# Patient Record
Sex: Male | Born: 1935 | Race: White | Hispanic: No | Marital: Married | Smoking: Former smoker
Health system: Southern US, Community
[De-identification: ages and names within clinical notes are randomized; demographics above are authoritative.]

## PROBLEM LIST (undated history)

## (undated) DIAGNOSIS — K5790 Diverticulosis of intestine, part unspecified, without perforation or abscess without bleeding: Secondary | ICD-10-CM

## (undated) DIAGNOSIS — B029 Zoster without complications: Secondary | ICD-10-CM

## (undated) DIAGNOSIS — C4491 Basal cell carcinoma of skin, unspecified: Secondary | ICD-10-CM

## (undated) DIAGNOSIS — E785 Hyperlipidemia, unspecified: Secondary | ICD-10-CM

## (undated) DIAGNOSIS — N400 Enlarged prostate without lower urinary tract symptoms: Secondary | ICD-10-CM

## (undated) DIAGNOSIS — N189 Chronic kidney disease, unspecified: Secondary | ICD-10-CM

## (undated) DIAGNOSIS — K635 Polyp of colon: Secondary | ICD-10-CM

## (undated) DIAGNOSIS — N200 Calculus of kidney: Secondary | ICD-10-CM

## (undated) DIAGNOSIS — I639 Cerebral infarction, unspecified: Secondary | ICD-10-CM

## (undated) DIAGNOSIS — D649 Anemia, unspecified: Secondary | ICD-10-CM

## (undated) DIAGNOSIS — G459 Transient cerebral ischemic attack, unspecified: Secondary | ICD-10-CM

## (undated) DIAGNOSIS — G5600 Carpal tunnel syndrome, unspecified upper limb: Secondary | ICD-10-CM

## (undated) HISTORY — DX: Transient cerebral ischemic attack, unspecified: G45.9

## (undated) HISTORY — DX: Polyp of colon: K63.5

## (undated) HISTORY — DX: Basal cell carcinoma of skin, unspecified: C44.91

## (undated) HISTORY — DX: Calculus of kidney: N20.0

## (undated) HISTORY — DX: Anemia, unspecified: D64.9

## (undated) HISTORY — DX: Diverticulosis of intestine, part unspecified, without perforation or abscess without bleeding: K57.90

## (undated) HISTORY — DX: Chronic kidney disease, unspecified: N18.9

## (undated) HISTORY — PX: HEMORRHOID SURGERY: SHX153

## (undated) HISTORY — DX: Zoster without complications: B02.9

## (undated) HISTORY — DX: Benign prostatic hyperplasia without lower urinary tract symptoms: N40.0

## (undated) HISTORY — DX: Cerebral infarction, unspecified: I63.9

## (undated) HISTORY — PX: LITHOTRIPSY: SUR834

## (undated) HISTORY — DX: Hyperlipidemia, unspecified: E78.5

## (undated) HISTORY — PX: PROSTATE BIOPSY: SHX241

## (undated) HISTORY — PX: CATARACT EXTRACTION, BILATERAL: SHX1313

## (undated) HISTORY — DX: Carpal tunnel syndrome, unspecified upper limb: G56.00

---

## 1999-01-26 ENCOUNTER — Other Ambulatory Visit: Admission: RE | Admit: 1999-01-26 | Discharge: 1999-01-26 | Payer: Self-pay | Admitting: Urology

## 2002-05-07 ENCOUNTER — Encounter (INDEPENDENT_AMBULATORY_CARE_PROVIDER_SITE_OTHER): Payer: Self-pay | Admitting: Gastroenterology

## 2004-05-23 ENCOUNTER — Ambulatory Visit: Payer: Self-pay | Admitting: Gastroenterology

## 2004-06-06 ENCOUNTER — Ambulatory Visit: Payer: Self-pay | Admitting: Gastroenterology

## 2004-06-19 ENCOUNTER — Ambulatory Visit: Payer: Self-pay | Admitting: Internal Medicine

## 2004-07-31 ENCOUNTER — Ambulatory Visit: Payer: Self-pay | Admitting: Internal Medicine

## 2004-08-28 ENCOUNTER — Ambulatory Visit: Payer: Self-pay | Admitting: Internal Medicine

## 2004-08-31 ENCOUNTER — Ambulatory Visit (HOSPITAL_COMMUNITY): Admission: RE | Admit: 2004-08-31 | Discharge: 2004-08-31 | Payer: Self-pay | Admitting: Urology

## 2004-09-25 ENCOUNTER — Ambulatory Visit: Payer: Self-pay | Admitting: Internal Medicine

## 2004-10-03 ENCOUNTER — Ambulatory Visit: Payer: Self-pay | Admitting: Internal Medicine

## 2004-10-10 ENCOUNTER — Ambulatory Visit (HOSPITAL_BASED_OUTPATIENT_CLINIC_OR_DEPARTMENT_OTHER): Admission: RE | Admit: 2004-10-10 | Discharge: 2004-10-10 | Payer: Self-pay | Admitting: Urology

## 2004-10-11 ENCOUNTER — Ambulatory Visit: Payer: Self-pay | Admitting: Internal Medicine

## 2004-10-11 ENCOUNTER — Encounter: Admission: RE | Admit: 2004-10-11 | Discharge: 2004-10-11 | Payer: Self-pay | Admitting: Internal Medicine

## 2004-11-16 ENCOUNTER — Ambulatory Visit: Payer: Self-pay | Admitting: Internal Medicine

## 2004-12-15 ENCOUNTER — Ambulatory Visit: Payer: Self-pay | Admitting: Internal Medicine

## 2005-01-15 ENCOUNTER — Ambulatory Visit: Payer: Self-pay | Admitting: Internal Medicine

## 2005-02-21 ENCOUNTER — Ambulatory Visit: Payer: Self-pay | Admitting: Internal Medicine

## 2005-03-30 ENCOUNTER — Ambulatory Visit: Payer: Self-pay | Admitting: Internal Medicine

## 2005-05-03 ENCOUNTER — Ambulatory Visit: Payer: Self-pay | Admitting: Internal Medicine

## 2005-05-22 ENCOUNTER — Ambulatory Visit: Payer: Self-pay | Admitting: Internal Medicine

## 2005-06-04 ENCOUNTER — Ambulatory Visit: Payer: Self-pay | Admitting: Internal Medicine

## 2005-07-10 ENCOUNTER — Ambulatory Visit: Payer: Self-pay | Admitting: Internal Medicine

## 2005-07-27 ENCOUNTER — Ambulatory Visit: Payer: Self-pay | Admitting: Family Medicine

## 2005-08-31 ENCOUNTER — Ambulatory Visit: Payer: Self-pay | Admitting: Internal Medicine

## 2005-10-08 ENCOUNTER — Ambulatory Visit: Payer: Self-pay | Admitting: Internal Medicine

## 2005-11-08 ENCOUNTER — Ambulatory Visit: Payer: Self-pay | Admitting: Internal Medicine

## 2005-12-05 ENCOUNTER — Ambulatory Visit: Payer: Self-pay | Admitting: Internal Medicine

## 2006-01-08 ENCOUNTER — Ambulatory Visit: Payer: Self-pay | Admitting: Internal Medicine

## 2006-02-04 ENCOUNTER — Ambulatory Visit: Payer: Self-pay | Admitting: Internal Medicine

## 2006-03-05 ENCOUNTER — Ambulatory Visit: Payer: Self-pay | Admitting: Internal Medicine

## 2006-03-06 ENCOUNTER — Ambulatory Visit: Payer: Self-pay | Admitting: Internal Medicine

## 2006-04-09 ENCOUNTER — Ambulatory Visit: Payer: Self-pay | Admitting: Internal Medicine

## 2006-05-09 ENCOUNTER — Ambulatory Visit: Payer: Self-pay | Admitting: Internal Medicine

## 2006-06-10 ENCOUNTER — Ambulatory Visit: Payer: Self-pay | Admitting: Internal Medicine

## 2006-07-17 ENCOUNTER — Ambulatory Visit: Payer: Self-pay | Admitting: Internal Medicine

## 2006-08-20 ENCOUNTER — Ambulatory Visit: Payer: Self-pay | Admitting: Internal Medicine

## 2006-09-24 ENCOUNTER — Ambulatory Visit: Payer: Self-pay | Admitting: Internal Medicine

## 2006-10-09 ENCOUNTER — Ambulatory Visit: Payer: Self-pay | Admitting: Internal Medicine

## 2006-10-09 LAB — CONVERTED CEMR LAB
Albumin ELP: 57.4 % (ref 55.8–66.1)
Alpha-1-Globulin: 3.9 % (ref 2.9–4.9)
Alpha-2-Globulin: 10.2 % (ref 7.1–11.8)
Beta Globulin: 5.4 % (ref 4.7–7.2)
Gamma Globulin: 19.3 % — ABNORMAL HIGH (ref 11.1–18.8)
M-Spike, %: 0.75
Total Protein, Serum Electrophoresis: 7.3 g/dL (ref 6.0–8.3)

## 2006-11-19 ENCOUNTER — Ambulatory Visit: Payer: Self-pay | Admitting: Internal Medicine

## 2006-12-25 ENCOUNTER — Ambulatory Visit: Payer: Self-pay | Admitting: Internal Medicine

## 2006-12-25 DIAGNOSIS — D649 Anemia, unspecified: Secondary | ICD-10-CM | POA: Insufficient documentation

## 2007-01-23 ENCOUNTER — Ambulatory Visit: Payer: Self-pay | Admitting: Internal Medicine

## 2007-02-26 ENCOUNTER — Ambulatory Visit: Payer: Self-pay | Admitting: Internal Medicine

## 2007-03-28 ENCOUNTER — Ambulatory Visit: Payer: Self-pay | Admitting: Internal Medicine

## 2007-03-31 ENCOUNTER — Ambulatory Visit: Payer: Self-pay | Admitting: Family Medicine

## 2007-03-31 ENCOUNTER — Ambulatory Visit: Payer: Self-pay | Admitting: Cardiology

## 2007-03-31 DIAGNOSIS — N400 Enlarged prostate without lower urinary tract symptoms: Secondary | ICD-10-CM | POA: Insufficient documentation

## 2007-03-31 DIAGNOSIS — R42 Dizziness and giddiness: Secondary | ICD-10-CM | POA: Insufficient documentation

## 2007-03-31 DIAGNOSIS — R519 Headache, unspecified: Secondary | ICD-10-CM | POA: Insufficient documentation

## 2007-03-31 DIAGNOSIS — R51 Headache: Secondary | ICD-10-CM | POA: Insufficient documentation

## 2007-04-02 ENCOUNTER — Ambulatory Visit: Payer: Self-pay | Admitting: Internal Medicine

## 2007-04-07 ENCOUNTER — Encounter: Admission: RE | Admit: 2007-04-07 | Discharge: 2007-04-07 | Payer: Self-pay | Admitting: Internal Medicine

## 2007-04-08 ENCOUNTER — Ambulatory Visit: Payer: Self-pay | Admitting: Internal Medicine

## 2007-04-14 ENCOUNTER — Encounter (INDEPENDENT_AMBULATORY_CARE_PROVIDER_SITE_OTHER): Payer: Self-pay | Admitting: *Deleted

## 2007-04-15 ENCOUNTER — Telehealth (INDEPENDENT_AMBULATORY_CARE_PROVIDER_SITE_OTHER): Payer: Self-pay | Admitting: *Deleted

## 2007-04-16 ENCOUNTER — Encounter (INDEPENDENT_AMBULATORY_CARE_PROVIDER_SITE_OTHER): Payer: Self-pay | Admitting: *Deleted

## 2007-04-18 ENCOUNTER — Telehealth (INDEPENDENT_AMBULATORY_CARE_PROVIDER_SITE_OTHER): Payer: Self-pay | Admitting: *Deleted

## 2007-04-28 ENCOUNTER — Ambulatory Visit: Payer: Self-pay | Admitting: Internal Medicine

## 2007-05-02 ENCOUNTER — Ambulatory Visit: Payer: Self-pay

## 2007-05-02 ENCOUNTER — Encounter (INDEPENDENT_AMBULATORY_CARE_PROVIDER_SITE_OTHER): Payer: Self-pay | Admitting: *Deleted

## 2007-05-19 ENCOUNTER — Telehealth (INDEPENDENT_AMBULATORY_CARE_PROVIDER_SITE_OTHER): Payer: Self-pay | Admitting: *Deleted

## 2007-05-19 ENCOUNTER — Encounter: Payer: Self-pay | Admitting: Internal Medicine

## 2007-05-27 ENCOUNTER — Telehealth: Payer: Self-pay | Admitting: Internal Medicine

## 2007-05-27 ENCOUNTER — Ambulatory Visit: Payer: Self-pay | Admitting: Internal Medicine

## 2007-05-28 ENCOUNTER — Encounter: Payer: Self-pay | Admitting: Internal Medicine

## 2007-06-30 ENCOUNTER — Ambulatory Visit: Payer: Self-pay | Admitting: Internal Medicine

## 2007-06-30 ENCOUNTER — Telehealth (INDEPENDENT_AMBULATORY_CARE_PROVIDER_SITE_OTHER): Payer: Self-pay | Admitting: *Deleted

## 2007-07-29 ENCOUNTER — Ambulatory Visit: Payer: Self-pay | Admitting: Internal Medicine

## 2007-08-18 ENCOUNTER — Encounter: Payer: Self-pay | Admitting: Internal Medicine

## 2007-08-29 ENCOUNTER — Ambulatory Visit: Payer: Self-pay | Admitting: Internal Medicine

## 2007-08-29 ENCOUNTER — Telehealth (INDEPENDENT_AMBULATORY_CARE_PROVIDER_SITE_OTHER): Payer: Self-pay | Admitting: *Deleted

## 2007-09-24 ENCOUNTER — Ambulatory Visit: Payer: Self-pay | Admitting: Internal Medicine

## 2007-09-24 DIAGNOSIS — H02409 Unspecified ptosis of unspecified eyelid: Secondary | ICD-10-CM | POA: Insufficient documentation

## 2007-09-24 DIAGNOSIS — E785 Hyperlipidemia, unspecified: Secondary | ICD-10-CM | POA: Insufficient documentation

## 2007-09-25 ENCOUNTER — Encounter: Admission: RE | Admit: 2007-09-25 | Discharge: 2007-09-25 | Payer: Self-pay | Admitting: Internal Medicine

## 2007-09-29 ENCOUNTER — Encounter (INDEPENDENT_AMBULATORY_CARE_PROVIDER_SITE_OTHER): Payer: Self-pay | Admitting: *Deleted

## 2007-10-06 ENCOUNTER — Encounter: Payer: Self-pay | Admitting: Internal Medicine

## 2007-10-27 ENCOUNTER — Ambulatory Visit: Payer: Self-pay | Admitting: Internal Medicine

## 2007-11-24 ENCOUNTER — Ambulatory Visit: Payer: Self-pay | Admitting: Internal Medicine

## 2007-12-17 ENCOUNTER — Ambulatory Visit: Payer: Self-pay | Admitting: Internal Medicine

## 2007-12-17 LAB — CONVERTED CEMR LAB: Rapid Strep: NEGATIVE

## 2007-12-29 ENCOUNTER — Ambulatory Visit: Payer: Self-pay | Admitting: Internal Medicine

## 2008-01-22 ENCOUNTER — Ambulatory Visit: Payer: Self-pay | Admitting: Internal Medicine

## 2008-03-05 ENCOUNTER — Ambulatory Visit: Payer: Self-pay | Admitting: Internal Medicine

## 2008-04-05 ENCOUNTER — Ambulatory Visit: Payer: Self-pay | Admitting: Internal Medicine

## 2008-05-05 ENCOUNTER — Ambulatory Visit: Payer: Self-pay | Admitting: Internal Medicine

## 2008-05-10 ENCOUNTER — Ambulatory Visit: Payer: Self-pay | Admitting: Internal Medicine

## 2008-05-10 DIAGNOSIS — Z87442 Personal history of urinary calculi: Secondary | ICD-10-CM | POA: Insufficient documentation

## 2008-05-10 DIAGNOSIS — Z86010 Personal history of colonic polyps: Secondary | ICD-10-CM | POA: Insufficient documentation

## 2008-05-10 DIAGNOSIS — K5730 Diverticulosis of large intestine without perforation or abscess without bleeding: Secondary | ICD-10-CM | POA: Insufficient documentation

## 2008-05-10 DIAGNOSIS — Z8673 Personal history of transient ischemic attack (TIA), and cerebral infarction without residual deficits: Secondary | ICD-10-CM | POA: Insufficient documentation

## 2008-05-10 LAB — CONVERTED CEMR LAB
Bilirubin Urine: NEGATIVE
Blood in Urine, dipstick: NEGATIVE
Glucose, Urine, Semiquant: NEGATIVE
Ketones, urine, test strip: NEGATIVE
Nitrite: NEGATIVE
Protein, U semiquant: NEGATIVE
Specific Gravity, Urine: 1.015
Urobilinogen, UA: 0.2
WBC Urine, dipstick: NEGATIVE
pH: 6.5

## 2008-05-14 ENCOUNTER — Ambulatory Visit: Payer: Self-pay | Admitting: Internal Medicine

## 2008-05-15 LAB — CONVERTED CEMR LAB
BUN: 10 mg/dL (ref 6–23)
Basophils Absolute: 0 10*3/uL (ref 0.0–0.1)
Basophils Relative: 0.1 % (ref 0.0–3.0)
Creatinine, Ser: 0.8 mg/dL (ref 0.4–1.5)
Eosinophils Absolute: 0 10*3/uL (ref 0.0–0.7)
Eosinophils Relative: 0.2 % (ref 0.0–5.0)
HCT: 41.2 % (ref 39.0–52.0)
Hemoglobin: 14.3 g/dL (ref 13.0–17.0)
Lymphocytes Relative: 11.5 % — ABNORMAL LOW (ref 12.0–46.0)
MCHC: 34.7 g/dL (ref 30.0–36.0)
MCV: 97 fL (ref 78.0–100.0)
Monocytes Absolute: 0.4 10*3/uL (ref 0.1–1.0)
Monocytes Relative: 5.9 % (ref 3.0–12.0)
Neutro Abs: 5.5 10*3/uL (ref 1.4–7.7)
Neutrophils Relative %: 82.3 % — ABNORMAL HIGH (ref 43.0–77.0)
Platelets: 214 10*3/uL (ref 150–400)
RBC: 4.25 M/uL (ref 4.22–5.81)
RDW: 11.9 % (ref 11.5–14.6)
WBC: 6.7 10*3/uL (ref 4.5–10.5)

## 2008-05-18 ENCOUNTER — Encounter (INDEPENDENT_AMBULATORY_CARE_PROVIDER_SITE_OTHER): Payer: Self-pay | Admitting: *Deleted

## 2008-05-18 ENCOUNTER — Telehealth: Payer: Self-pay | Admitting: Internal Medicine

## 2008-05-19 ENCOUNTER — Telehealth (INDEPENDENT_AMBULATORY_CARE_PROVIDER_SITE_OTHER): Payer: Self-pay | Admitting: *Deleted

## 2008-05-20 ENCOUNTER — Encounter: Payer: Self-pay | Admitting: Internal Medicine

## 2008-05-24 ENCOUNTER — Telehealth (INDEPENDENT_AMBULATORY_CARE_PROVIDER_SITE_OTHER): Payer: Self-pay | Admitting: *Deleted

## 2008-05-26 ENCOUNTER — Ambulatory Visit: Payer: Self-pay | Admitting: Internal Medicine

## 2008-05-27 ENCOUNTER — Ambulatory Visit (HOSPITAL_COMMUNITY): Admission: RE | Admit: 2008-05-27 | Discharge: 2008-05-27 | Payer: Self-pay | Admitting: Urology

## 2008-06-01 ENCOUNTER — Encounter (INDEPENDENT_AMBULATORY_CARE_PROVIDER_SITE_OTHER): Payer: Self-pay | Admitting: *Deleted

## 2008-06-01 ENCOUNTER — Ambulatory Visit: Payer: Self-pay | Admitting: Internal Medicine

## 2008-06-01 LAB — CONVERTED CEMR LAB
OCCULT 1: NEGATIVE
OCCULT 2: NEGATIVE
OCCULT 3: NEGATIVE

## 2008-06-07 ENCOUNTER — Ambulatory Visit: Payer: Self-pay | Admitting: Internal Medicine

## 2008-07-05 ENCOUNTER — Ambulatory Visit: Payer: Self-pay | Admitting: Internal Medicine

## 2008-08-06 ENCOUNTER — Ambulatory Visit: Payer: Self-pay | Admitting: Internal Medicine

## 2008-09-06 ENCOUNTER — Ambulatory Visit: Payer: Self-pay | Admitting: Internal Medicine

## 2008-10-11 ENCOUNTER — Encounter: Payer: Self-pay | Admitting: Internal Medicine

## 2008-10-12 ENCOUNTER — Ambulatory Visit: Payer: Self-pay | Admitting: Internal Medicine

## 2008-11-10 ENCOUNTER — Ambulatory Visit: Payer: Self-pay | Admitting: Internal Medicine

## 2008-11-29 ENCOUNTER — Ambulatory Visit: Payer: Self-pay | Admitting: Family Medicine

## 2008-11-30 ENCOUNTER — Encounter: Payer: Self-pay | Admitting: Family Medicine

## 2008-12-06 ENCOUNTER — Ambulatory Visit: Payer: Self-pay | Admitting: Internal Medicine

## 2009-01-07 ENCOUNTER — Ambulatory Visit: Payer: Self-pay | Admitting: Internal Medicine

## 2009-01-08 LAB — CONVERTED CEMR LAB
Basophils Absolute: 0 10*3/uL (ref 0.0–0.1)
Basophils Relative: 1 % (ref 0–1)
Eosinophils Absolute: 0.1 10*3/uL (ref 0.0–0.7)
Eosinophils Relative: 3 % (ref 0–5)
HCT: 36.9 % — ABNORMAL LOW (ref 39.0–52.0)
Hemoglobin: 12.4 g/dL — ABNORMAL LOW (ref 13.0–17.0)
Lymphocytes Relative: 27 % (ref 12–46)
Lymphs Abs: 1.2 10*3/uL (ref 0.7–4.0)
MCHC: 33.6 g/dL (ref 30.0–36.0)
MCV: 95.3 fL (ref 78.0–100.0)
Monocytes Absolute: 0.7 10*3/uL (ref 0.1–1.0)
Monocytes Relative: 14 % — ABNORMAL HIGH (ref 3–12)
Neutro Abs: 2.5 10*3/uL (ref 1.7–7.7)
Neutrophils Relative %: 55 % (ref 43–77)
Platelets: 184 10*3/uL (ref 150–400)
RBC: 3.87 M/uL — ABNORMAL LOW (ref 4.22–5.81)
RDW: 13.5 % (ref 11.5–15.5)
WBC: 4.5 10*3/uL (ref 4.0–10.5)

## 2009-01-10 ENCOUNTER — Encounter (INDEPENDENT_AMBULATORY_CARE_PROVIDER_SITE_OTHER): Payer: Self-pay | Admitting: *Deleted

## 2009-01-20 HISTORY — PX: COLONOSCOPY W/ POLYPECTOMY: SHX1380

## 2009-02-02 ENCOUNTER — Ambulatory Visit: Payer: Self-pay | Admitting: Gastroenterology

## 2009-02-08 ENCOUNTER — Ambulatory Visit: Payer: Self-pay | Admitting: Internal Medicine

## 2009-02-15 ENCOUNTER — Telehealth: Payer: Self-pay | Admitting: Gastroenterology

## 2009-02-17 ENCOUNTER — Encounter: Payer: Self-pay | Admitting: Gastroenterology

## 2009-02-17 ENCOUNTER — Ambulatory Visit: Payer: Self-pay | Admitting: Gastroenterology

## 2009-02-17 LAB — HM COLONOSCOPY

## 2009-03-01 ENCOUNTER — Encounter: Payer: Self-pay | Admitting: Gastroenterology

## 2009-03-11 ENCOUNTER — Ambulatory Visit: Payer: Self-pay | Admitting: Internal Medicine

## 2009-04-15 ENCOUNTER — Ambulatory Visit: Payer: Self-pay | Admitting: Internal Medicine

## 2009-05-06 ENCOUNTER — Encounter (INDEPENDENT_AMBULATORY_CARE_PROVIDER_SITE_OTHER): Payer: Self-pay | Admitting: *Deleted

## 2009-05-12 ENCOUNTER — Telehealth: Payer: Self-pay | Admitting: Gastroenterology

## 2009-05-16 ENCOUNTER — Ambulatory Visit: Payer: Self-pay | Admitting: Internal Medicine

## 2009-06-13 ENCOUNTER — Ambulatory Visit: Payer: Self-pay | Admitting: Internal Medicine

## 2009-06-23 ENCOUNTER — Ambulatory Visit: Payer: Self-pay | Admitting: Internal Medicine

## 2009-07-12 ENCOUNTER — Ambulatory Visit: Payer: Self-pay | Admitting: Internal Medicine

## 2009-08-12 ENCOUNTER — Ambulatory Visit: Payer: Self-pay | Admitting: Internal Medicine

## 2009-09-19 ENCOUNTER — Ambulatory Visit: Payer: Self-pay | Admitting: Internal Medicine

## 2009-09-21 ENCOUNTER — Encounter: Payer: Self-pay | Admitting: Internal Medicine

## 2009-10-17 ENCOUNTER — Ambulatory Visit: Payer: Self-pay | Admitting: Internal Medicine

## 2009-11-17 ENCOUNTER — Ambulatory Visit: Payer: Self-pay | Admitting: Internal Medicine

## 2009-12-20 ENCOUNTER — Ambulatory Visit: Payer: Self-pay | Admitting: Internal Medicine

## 2010-01-20 ENCOUNTER — Ambulatory Visit: Payer: Self-pay | Admitting: Family Medicine

## 2010-01-25 ENCOUNTER — Encounter: Payer: Self-pay | Admitting: Internal Medicine

## 2010-01-26 ENCOUNTER — Ambulatory Visit: Payer: Self-pay | Admitting: Internal Medicine

## 2010-02-10 ENCOUNTER — Telehealth (INDEPENDENT_AMBULATORY_CARE_PROVIDER_SITE_OTHER): Payer: Self-pay | Admitting: *Deleted

## 2010-02-20 ENCOUNTER — Ambulatory Visit: Payer: Self-pay | Admitting: Internal Medicine

## 2010-03-28 ENCOUNTER — Ambulatory Visit: Payer: Self-pay | Admitting: Internal Medicine

## 2010-03-31 ENCOUNTER — Ambulatory Visit: Payer: Self-pay | Admitting: Internal Medicine

## 2010-03-31 DIAGNOSIS — Z85828 Personal history of other malignant neoplasm of skin: Secondary | ICD-10-CM | POA: Insufficient documentation

## 2010-04-24 ENCOUNTER — Ambulatory Visit: Payer: Self-pay | Admitting: Internal Medicine

## 2010-05-23 ENCOUNTER — Ambulatory Visit: Payer: Self-pay | Admitting: Internal Medicine

## 2010-05-25 ENCOUNTER — Telehealth: Payer: Self-pay | Admitting: Internal Medicine

## 2010-07-20 ENCOUNTER — Ambulatory Visit
Admission: RE | Admit: 2010-07-20 | Discharge: 2010-07-20 | Payer: Self-pay | Source: Home / Self Care | Attending: Internal Medicine | Admitting: Internal Medicine

## 2010-07-28 ENCOUNTER — Telehealth: Payer: Self-pay | Admitting: Internal Medicine

## 2010-07-28 ENCOUNTER — Ambulatory Visit
Admission: RE | Admit: 2010-07-28 | Discharge: 2010-07-28 | Payer: Self-pay | Source: Home / Self Care | Attending: Internal Medicine | Admitting: Internal Medicine

## 2010-07-28 ENCOUNTER — Other Ambulatory Visit: Payer: Self-pay | Admitting: Internal Medicine

## 2010-07-28 ENCOUNTER — Encounter: Payer: Self-pay | Admitting: Internal Medicine

## 2010-07-28 LAB — CBC WITH DIFFERENTIAL/PLATELET
Basophils Absolute: 0 10*3/uL (ref 0.0–0.1)
Basophils Relative: 0.9 % (ref 0.0–3.0)
Eosinophils Absolute: 0.1 10*3/uL (ref 0.0–0.7)
Eosinophils Relative: 2.7 % (ref 0.0–5.0)
HCT: 39.2 % (ref 39.0–52.0)
Hemoglobin: 13.9 g/dL (ref 13.0–17.0)
Lymphocytes Relative: 23.8 % (ref 12.0–46.0)
Lymphs Abs: 1.3 10*3/uL (ref 0.7–4.0)
MCHC: 35.4 g/dL (ref 30.0–36.0)
MCV: 95.4 fl (ref 78.0–100.0)
Monocytes Absolute: 0.7 10*3/uL (ref 0.1–1.0)
Monocytes Relative: 13.3 % — ABNORMAL HIGH (ref 3.0–12.0)
Neutro Abs: 3.2 10*3/uL (ref 1.4–7.7)
Neutrophils Relative %: 59.3 % (ref 43.0–77.0)
Platelets: 195 10*3/uL (ref 150.0–400.0)
RBC: 4.11 Mil/uL — ABNORMAL LOW (ref 4.22–5.81)
RDW: 13 % (ref 11.5–14.6)
WBC: 5.4 10*3/uL (ref 4.5–10.5)

## 2010-07-28 LAB — BASIC METABOLIC PANEL
BUN: 22 mg/dL (ref 6–23)
CO2: 31 mEq/L (ref 19–32)
Calcium: 9.2 mg/dL (ref 8.4–10.5)
Chloride: 102 mEq/L (ref 96–112)
Creatinine, Ser: 0.7 mg/dL (ref 0.4–1.5)
GFR: 110.78 mL/min (ref >60.00)
Glucose, Bld: 84 mg/dL (ref 70–99)
Potassium: 3.8 mEq/L (ref 3.5–5.1)
Sodium: 142 mEq/L (ref 135–145)

## 2010-08-20 LAB — CONVERTED CEMR LAB
ALT: 18 units/L (ref 0–53)
ALT: 19 units/L (ref 0–53)
AST: 21 units/L (ref 0–37)
AST: 24 units/L (ref 0–37)
Albumin: 3.6 g/dL (ref 3.5–5.2)
Albumin: 3.8 g/dL (ref 3.5–5.2)
Alkaline Phosphatase: 70 units/L (ref 39–117)
Alkaline Phosphatase: 71 units/L (ref 39–117)
BUN: 16 mg/dL (ref 6–23)
BUN: 19 mg/dL (ref 6–23)
Basophils Absolute: 0 10*3/uL (ref 0.0–0.1)
Basophils Absolute: 0.1 10*3/uL (ref 0.0–0.1)
Basophils Relative: 0.8 % (ref 0.0–3.0)
Basophils Relative: 1.3 % — ABNORMAL HIGH (ref 0.0–1.0)
Bilirubin Urine: NEGATIVE
Bilirubin, Direct: 0.1 mg/dL (ref 0.0–0.3)
Bilirubin, Direct: 0.1 mg/dL (ref 0.0–0.3)
Blood in Urine, dipstick: NEGATIVE
CO2: 31 meq/L (ref 19–32)
CO2: 33 meq/L — ABNORMAL HIGH (ref 19–32)
Calcium: 8.6 mg/dL (ref 8.4–10.5)
Calcium: 8.6 mg/dL (ref 8.4–10.5)
Chloride: 105 meq/L (ref 96–112)
Chloride: 107 meq/L (ref 96–112)
Cholesterol, target level: 200 mg/dL
Cholesterol: 130 mg/dL (ref 0–200)
Creatinine, Ser: 0.7 mg/dL (ref 0.4–1.5)
Creatinine, Ser: 0.8 mg/dL (ref 0.4–1.5)
Eosinophils Absolute: 0.1 10*3/uL (ref 0.0–0.6)
Eosinophils Absolute: 0.1 10*3/uL (ref 0.0–0.7)
Eosinophils Relative: 1.9 % (ref 0.0–5.0)
Eosinophils Relative: 2.9 % (ref 0.0–5.0)
Folate: 12.6 ng/mL
GFR calc Af Amer: 120 mL/min
GFR calc non Af Amer: 112.67 mL/min (ref >60)
GFR calc non Af Amer: 99 mL/min
Glucose, Bld: 77 mg/dL (ref 70–99)
Glucose, Bld: 91 mg/dL (ref 70–99)
Glucose, Urine, Semiquant: NEGATIVE
HCT: 36.6 % — ABNORMAL LOW (ref 39.0–52.0)
HCT: 38.1 % — ABNORMAL LOW (ref 39.0–52.0)
HDL goal, serum: 40 mg/dL
HDL: 42.2 mg/dL (ref >39.00)
Hemoglobin: 12.7 g/dL — ABNORMAL LOW (ref 13.0–17.0)
Hemoglobin: 13.3 g/dL (ref 13.0–17.0)
Ketones, urine, test strip: NEGATIVE
LDL Cholesterol: 74 mg/dL (ref 0–99)
LDL Goal: 100 mg/dL
Lymphocytes Relative: 23 % (ref 12.0–46.0)
Lymphocytes Relative: 23.7 % (ref 12.0–46.0)
Lymphs Abs: 1.1 10*3/uL (ref 0.7–4.0)
MCHC: 34.8 g/dL (ref 30.0–36.0)
MCHC: 34.8 g/dL (ref 30.0–36.0)
MCV: 95.4 fL (ref 78.0–100.0)
MCV: 97 fL (ref 78.0–100.0)
Monocytes Absolute: 0.5 10*3/uL (ref 0.1–1.0)
Monocytes Absolute: 0.6 10*3/uL (ref 0.2–0.7)
Monocytes Relative: 10.8 % (ref 3.0–12.0)
Monocytes Relative: 12.8 % — ABNORMAL HIGH (ref 3.0–11.0)
Neutro Abs: 2.9 10*3/uL (ref 1.4–7.7)
Neutro Abs: 3 10*3/uL (ref 1.4–7.7)
Neutrophils Relative %: 59.3 % (ref 43.0–77.0)
Neutrophils Relative %: 63.5 % (ref 43.0–77.0)
Nitrite: NEGATIVE
Platelets: 176 10*3/uL (ref 150.0–400.0)
Platelets: 199 10*3/uL (ref 150–400)
Potassium: 3.7 meq/L (ref 3.5–5.1)
Potassium: 3.9 meq/L (ref 3.5–5.1)
Protein, U semiquant: NEGATIVE
RBC: 3.77 M/uL — ABNORMAL LOW (ref 4.22–5.81)
RBC: 3.99 M/uL — ABNORMAL LOW (ref 4.22–5.81)
RDW: 12.2 % (ref 11.5–14.6)
RDW: 13.4 % (ref 11.5–14.6)
Sed Rate: 19 mm/hr (ref 0–20)
Sodium: 141 meq/L (ref 135–145)
Sodium: 143 meq/L (ref 135–145)
Specific Gravity, Urine: 1.01
TSH: 2.87 microintl units/mL (ref 0.35–5.50)
TSH: 3.55 microintl units/mL (ref 0.35–5.50)
Total Bilirubin: 0.6 mg/dL (ref 0.3–1.2)
Total Bilirubin: 0.8 mg/dL (ref 0.3–1.2)
Total CHOL/HDL Ratio: 3
Total Protein: 6.5 g/dL (ref 6.0–8.3)
Total Protein: 6.9 g/dL (ref 6.0–8.3)
Triglycerides: 68 mg/dL (ref 0.0–149.0)
Urobilinogen, UA: NEGATIVE
VLDL: 13.6 mg/dL (ref 0.0–40.0)
Vitamin B-12: 822 pg/mL (ref 211–911)
WBC Urine, dipstick: NEGATIVE
WBC: 4.7 10*3/uL (ref 4.5–10.5)
WBC: 4.8 10*3/uL (ref 4.5–10.5)
pH: 7

## 2010-08-22 NOTE — Assessment & Plan Note (Signed)
Summary: B 12 SHOT//CA   Nurse Visit       Medication Administration  Injection # 1:    Medication: Vit B12 1000 mcg    Diagnosis: ANEMIA (ICD-285.9)    Route: IM    Site: L deltoid    Exp Date: 07/23/2008    Lot #: [id number]    Mfr: american regent    Given by: Floydene Flock (January 23, 2007 1:33 PM)  Orders Added: 1)  Vit B12 1000 mcg [J3420] 2)  Admin of Therapeutic Inj  intramuscular or subcutaneous [90772]

## 2010-08-22 NOTE — Progress Notes (Signed)
Summary: NEW MEDICINE IS REALLY WORKING --HEADACHE IS GONE  Phone Note Call from Patient Call back at Home Phone [phone]   Reason for Call: Talk to Nurse Summary of Call: DR HOPPER PATIENT  STOPPED BY TO SPEAK TO KATHY, TOLD HIM KATHY AND DR HOPPER WERE REALLY BEHIND, WOULD HE LEAVE A MESSAGE??-----HE SAID HE JUST WANTED KATHY AND DR HOPPER TO KNOW THAT THE "NEW MEDICINE" WAS OK--HEADACHE WAS GONE--HE DOES WANT KATHY TO CALL HIM BACK ON CELL AT [phone] Initial call taken by: Jerolyn Shin,  April 18, 2007 2:41 PM  Follow-up for Phone Call        called mr. Muenchow and he is feeling good and wants to go back to work/  I told him we can not allow that until we find out what happened and the neurologist is the person to do that.  Suggested he call the neurologist office and check and see if any cancellations.   Follow-up by: Wendall Stade,  April 18, 2007 7:00 PM

## 2010-08-22 NOTE — Assessment & Plan Note (Signed)
Summary: B12 SHOT/KDC   Nurse Visit   Allergies: 1)  ! Prednisone  Medication Administration  Injection # 1:    Medication: Vit B12 1000 mcg    Diagnosis: ANEMIA (ICD-285.9)    Route: IM    Site: L deltoid    Exp Date: 05/24/2011    Lot #: [id number]    Mfr: American Regent    Given by: Doristine Devoid (September 19, 2009 11:55 AM)  Orders Added: 1)  Vit B12 1000 mcg [J3420] 2)  Admin of Therapeutic Inj  intramuscular or subcutaneous [96372]   Medication Administration  Injection # 1:    Medication: Vit B12 1000 mcg    Diagnosis: ANEMIA (ICD-285.9)    Route: IM    Site: L deltoid    Exp Date: 05/24/2011    Lot #: [id number]    Mfr: American Regent    Given by: Doristine Devoid (September 19, 2009 11:55 AM)  Orders Added: 1)  Vit B12 1000 mcg [J3420] 2)  Admin of Therapeutic Inj  intramuscular or subcutaneous [29562]

## 2010-08-22 NOTE — Assessment & Plan Note (Signed)
Summary: acute only for kidney stone//ph   Vital Signs:  Patient Profile:   77 Years Old Male Weight:      189.6 pounds Temp:     97.5 degrees F oral Pulse rate:   78 / minute Resp:     17 per minute BP sitting:   138 / 80  (left arm) Cuff size:   large  Pt. in pain?   yes    Location:   L flank     Intensity:   7 or <    Type:       burning  Vitals Entered By: Shonna Chock (May 10, 2008 9:44 AM)                  PCP:  [facility]  Chief Complaint:  DISCUSS KIDNEY STONE-NO SLEEP DUE TO DISCOMFORT. DISCUSS FLU VACCINE and Back pain.  History of Present Illness: Acute onset 05/08/08 after reclining . Rx: Tylenol , ice & heat  w/o benefit. PMH of kidney stones(Dr Evans)  Back Pain      This is an 77 year old man who presents with Back pain.  The patient reports rest pain, but denies fever, chills, weakness, loss of sensation, fecal incontinence, urinary incontinence, urinary retention, dysuria, inability to work, and inability to care for self.  The pain is located in the left low back.  The pain began at home and suddenly.  The pain radiates to the left flank.  The pain is made worse by lying down.  Risk factors for serious underlying conditions include bedrest with no relief and age >= 50 years.      Current Allergies: ! PREDNISONE  Past Medical History:    Benign prostatic hypertrophy    Hyperlipidemia    Nephrolithiasis, hx of    Transient ischemic attack, hx of; Dr Pearlean Brownie    Colonic polyps, hx of    Diverticulosis, colon  Past Surgical History:    Lithotripsy, DrEvans    Hemorrhoidectomy    Colon polypectomy, due 2010 (prev Dr Victorino Dike; he'll see Dr Jarold Motto)     Review of Systems  General      Denies fatigue and sweats.  GI      Complains of nausea.      Denies abdominal pain, bloody stools, dark tarry stools, indigestion, and vomiting.  GU      See HPI      Denies dysuria.  MS      See HPI      Denies muscle aches and muscle  weakness.  Derm      Denies changes in color of skin, lesion(s), and rash.  Neuro      Complains of numbness and tingling.      Positional hand N&T @ night  Heme      Complains of abnormal bruising.      Denies bleeding.      On Aggrenox   Physical Exam  General:     well-nourished,in no acute distress; alert,appropriate and cooperative throughout examination; appears younger than age Eyes:     No corneal or conjunctival inflammation noted. Perrla. No icterus. Ptosis OD >OS Lungs:     Normal respiratory effort, chest expands symmetrically. Lungs are clear to auscultation, no crackles or wheezes. Heart:     Normal rate and regular rhythm. S1 and S2 normal without gallop, murmur, click, rub or other extra sounds. Abdomen:     Bowel sounds positive,abdomen soft and non-tender without masses, organomegaly or hernias noted.  Msk:     Asymmetry of thoracic muscles ; R>L. Tender to percussion L flank Extremities:     No clubbing, cyanosis, edema, or deformity noted with normal full range of motion of all joints.   Neg SLR bilat Neurologic:     alert & oriented X3, strength normal in all extremities, heel/toe walking gait normal, and DTRs symmetrical and normal.   Skin:     Intact without suspicious lesions or rashes Cervical Nodes:     No lymphadenopathy noted Axillary Nodes:     No palpable lymphadenopathy Psych:     memory intact for recent and remote, normally interactive, good eye contact, not anxious appearing, and not depressed appearing.      Impression & Recommendations:  Problem # 1:  FLANK PAIN, LEFT (ICD-789.09)  Orders: UA Dipstick w/o Micro (manual) (62952) TLB-CBC Platelet - w/Differential (85025-CBCD) TLB-Creatinine, Blood (82565-CREA) TLB-BUN (Urea Nitrogen) (84520-BUN) Radiology Referral (Radiology) Durable Medical Equipment (DME) Urology Referral (Urology)  His updated medication list for this problem includes:    Hydrocodone-acetaminophen 5-500 Mg  Tabs (Hydrocodone-acetaminophen) .Marland Kitchen... 1-2 q6 -8 hrs   Problem # 2:  NEPHROLITHIASIS, HX OF (ICD-V13.01)  Orders: UA Dipstick w/o Micro (manual) (84132) TLB-CBC Platelet - w/Differential (85025-CBCD) Radiology Referral (Radiology) Durable Medical Equipment (DME) Urology Referral (Urology)   Problem # 3:  COLONIC POLYPS, HX OF (ICD-V12.72)  Orders: UA Dipstick w/o Micro (manual) (44010) TLB-CBC Platelet - w/Differential (85025-CBCD)   Problem # 4:  DIVERTICULOSIS, COLON (ICD-562.10)  Orders: UA Dipstick w/o Micro (manual) (27253) TLB-CBC Platelet - w/Differential (85025-CBCD)   Complete Medication List: 1)  Meclizine Hcl 25 Mg Tabs (Meclizine hcl) .... 1/2-1 tab po every 8 hours as needed for dizziness 2)  Proscar 5 Mg Tabs (Finasteride) .Marland Kitchen.. 1 by mouth once daily 3)  Clonazepam 0.5 Mg Tabs (Clonazepam) .... Take as directed 4)  Aggrenox 25-200 Mg Cp12 (Aspirin-dipyridamole) .Marland Kitchen.. 1 bid 5)  Lipitor 10 Mg Tabs (Atorvastatin calcium) .Marland Kitchen.. 1 by mouth qd 6)  Doxycycline Monohydrate 100 Mg Caps (Doxycycline monohydrate) .Marland Kitchen.. 1 two times a day x 5 days then 1 once daily 7)  Hycodan 5-1.5 Mg/22ml Syrp (Hydrocodone-homatropine) .Marland Kitchen.. 1 tsp q 6 hrs prn 8)  Hydrocodone-acetaminophen 5-500 Mg Tabs (Hydrocodone-acetaminophen) .Marland Kitchen.. 1-2 q6 -8 hrs   Patient Instructions: 1)  Drink as much fluid as you can tolerate for the next few days.Complete stool cards ; strain urine. 2)  Recommended remaining out of work for  10/19-21/09; you can't work on pain meds.   Prescriptions: HYDROCODONE-ACETAMINOPHEN 5-500 MG TABS (HYDROCODONE-ACETAMINOPHEN) 1-2 q6 -8 hrs  #30 x 0   Entered and Authorized by:   Marga Melnick MD   Signed by:   Marga Melnick MD on 05/10/2008   Method used:   Print then Give to Patient   RxID:   [id number]  ] Laboratory Results   Urine Tests    Routine Urinalysis   Color: yellow Appearance: Clear Glucose: negative   (Normal Range: Negative) Bilirubin:  negative   (Normal Range: Negative) Ketone: negative   (Normal Range: Negative) Spec. Gravity: 1.015   (Normal Range: 1.003-1.035) Blood: negative   (Normal Range: Negative) pH: 6.5   (Normal Range: 5.0-8.0) Protein: negative   (Normal Range: Negative) Urobilinogen: 0.2   (Normal Range: 0-1) Nitrite: negative   (Normal Range: Negative) Leukocyte Esterace: negative   (Normal Range: Negative)

## 2010-08-22 NOTE — Letter (Signed)
Summary: [facility]   Imported By: Freddy Jaksch 07/22/2007 11:56:56  _____________________________________________________________________  External Attachment:    Type:   Image     Comment:   External Document

## 2010-08-22 NOTE — Letter (Signed)
Summary: Out of Work  All     ,     Phone:   Fax:     March 31, 2007   Employee:  [employer]    To Whom It May Concern:   For Medical reasons, please excuse the above named employee from work for the following dates:  Start:   03/31/07  End:   until cleared by [place]--  several tests being ordered  If you need additional information, please feel free to contact our office.         Sincerely,    Loreen Freud DO

## 2010-08-22 NOTE — Progress Notes (Signed)
Summary: HOP--STILL HURTING  Phone Note Call from Patient Call back at [facility] Phone [phone] Call back at [phone]   Caller: Patient Summary of Call: PATIENT IS CALLING WANTING TO KNOW  WHAT IS GOING ON WITH HIM. HE SAYS HE WOULD LIKE TO KNOW SOMETHING ON  TODAY AND HE SAYS HE STILL IN ALOT OF PAIN. WOULD LIKE TO KNOW SOMETHING TODAY IF POSSIBLE. Initial call taken by: Freddy Jaksch,  May 18, 2008 11:14 AM  Follow-up for Phone Call        WE NEED CT THAT UROLOGIST ORDERED. CALLED [phone] DR.DAVIS (UROLOGIST)-HE ACTUALLY SEEN DR.EVANS. CALLED AND REQUESTED RESULTS. Follow-up by: Shonna Chock,  May 18, 2008 11:21 AM  Additional Follow-up for Phone Call Additional follow up Details #1::        SPOKE WITH PATIENT, AWARE DR.Anaysha Andre WILL REVIEW REPORT AND I WILL CALL BACK. PATIENT SAID PAIN VARIES SOMETIMES ITS OK OTHER TIMES ITS WORSE. DR.Nadirah Socorro PLEASE ADVISE Additional Follow-up by: Shonna Chock,  May 18, 2008 11:51 AM  New Problems: LOW BACK PAIN SYNDROME, SEVERE (ICD-724.2)   Additional Follow-up for Phone Call Additional follow up Details #2::    Ireviewed CT reports & Dr Leta Jungling evaluation; I recommend MRI of LS spine  Follow-up by: Marga Melnick MD,  May 18, 2008 9:14 PM  Additional Follow-up for Phone Call Additional follow up Details #3:: Details for Additional Follow-up Action Taken: D/W PATIENT, PATIENT OK'D./[facility]  May 19, 2008 8:20 AM   New Problems: LOW BACK PAIN SYNDROME, SEVERE (ICD-724.2)

## 2010-08-22 NOTE — Assessment & Plan Note (Signed)
Summary: b-12/cbs   Nurse Visit    Prior Medications: MECLIZINE HCL 25 MG TABS (MECLIZINE HCL) 1/2-1 TAB PO EVERY 8 HOURS as needed FOR DIZZINESS PROSCAR 5 MG  TABS (FINASTERIDE) 1 by mouth once daily CLONAZEPAM 0.5 MG  TABS (CLONAZEPAM) TAKE AS DIRECTED AGGRENOX 25-200 MG  CP12 (ASPIRIN-DIPYRIDAMOLE) 1 bid LIPITOR 10 MG  TABS (ATORVASTATIN CALCIUM) 1 by mouth qd HYDROCODONE-ACETAMINOPHEN 5-500 MG TABS (HYDROCODONE-ACETAMINOPHEN) 1-2 q6 -8 hrs VALTREX 1 GM TABS (VALACYCLOVIR HCL) 1 by mouth three times a day x 1 week Current Allergies: ! PREDNISONE    Medication Administration  Injection # 1:    Medication: Vit B12 1000 mcg    Diagnosis: ANEMIA (ICD-285.9)    Route: IM    Site: L deltoid    Exp Date: 04/2010    Lot #: [id number]    Mfr: American Regent    Patient tolerated injection without complications    Given by: Floydene Flock CMA (September 06, 2008 1:12 PM)  Orders Added: 1)  Admin of Therapeutic Inj  intramuscular or subcutaneous [96372] 2)  Vit B12 1000 mcg Kallinikos.Fontana    ]  Medication Administration  Injection # 1:    Medication: Vit B12 1000 mcg    Diagnosis: ANEMIA (ICD-285.9)    Route: IM    Site: L deltoid    Exp Date: 04/2010    Lot #: [id number]    Mfr: American Regent    Patient tolerated injection without complications    Given by: Floydene Flock CMA (September 06, 2008 1:12 PM)  Orders Added: 1)  Admin of Therapeutic Inj  intramuscular or subcutaneous [96372] 2)  Vit B12 1000 mcg [J3420]  Appended Document: b-12/cbs

## 2010-08-22 NOTE — Procedures (Signed)
Summary: Colonoscopy   Colonoscopy  Procedure date:  02/17/2009  Findings:      Location:  [facility].   COLONOSCOPY PROCEDURE REPORT  PATIENT:  Justin [name], Justin [name]  MR#:  [id number] BIRTHDATE:   1924-12-16, 77 yrs. old   GENDER:   male  ENDOSCOPIST:   Barbette Hair. Arlyce Dice, MD Referred by:   PROCEDURE DATE:  02/17/2009 PROCEDURE:  Colonoscopy with snare polypectomy ASA CLASS:   Class II INDICATIONS: rectal bleeding, history of pre-cancerous (adenomatous) colon polyps, family history of colon cancer sibling  MEDICATIONS:    Fentanyl 50 mcg IV, Versed 6 mg IV  DESCRIPTION OF PROCEDURE:   After the risks benefits and alternatives of the procedure were thoroughly explained, informed consent was obtained.  Digital rectal exam was performed and revealed no abnormalities.   The LB [id number] endoscope was introduced through the anus and advanced to the cecum, which was identified by both the appendix and ileocecal valve, without limitations.  The quality of the prep was excellent, using MiraLax.  The instrument was then slowly withdrawn as the colon was fully examined. <<PROCEDUREIMAGES>>                      <<OLD IMAGES>>  FINDINGS:  A sessile polyp was found in the distal transverse colon. It was 3 mm in size. Polyp was snared without cautery. Retrieval was successful (see image12). snare polyp  Severe diverticulosis was found sigmoid to descending  Internal hemorrhoids were found (see image18).  This was otherwise a normal examination of the colon (see image3, image5, image6, image8, image9, image10, image11, image14, and image17).   Retroflexed views in the rectum revealed no abnormalities.    The scope was then withdrawn from the patient and the procedure completed.  COMPLICATIONS:   None  ENDOSCOPIC IMPRESSION:  1) 3 mm sessile polyp in the distal transverse colon  2) Severe diverticulosis in the sigmoid to descending  3) Internal hemorrhoids  4) Otherwise normal  examination  Limited Rectal Bleeding Secondary to Hemorrhoids  RECOMMENDATIONS:  1) Return to the care of your primary provider. GI follow up as needed  REPEAT EXAM:   No   _______________________________ Barbette Hair. Arlyce Dice, MD  CC: Pecola Lawless, MD      REPORT OF SURGICAL PATHOLOGY   Case #: [id number] Patient Name: Justin [name], Justin [name]. Office Chart Number:  [id number]   MRN: [id number] Pathologist: H. Hollice Espy, MD DOB/Age  May 06, 1925 (Age: 77)    Gender: M Date Taken:  02/17/2009 Date Received: 02/18/2009   FINAL DIAGNOSIS   ***MICROSCOPIC EXAMINATION AND DIAGNOSIS***   COLON, TRANSVERSE, BIOPSY:  -  TUBULAR ADENOMA (ONE FRAGMENT).   -  POLYPOID FRAGMENT OF BENIGN COLONIC MUCOSA. -  NO HIGH GRADE DYSPLASIA OR MALIGNANCY IDENTIFIED.   cl1 Date Reported:  02/21/2009     H. Hollice Espy, MD  [facility] [address] [place], [place]  [address]    Dear Mr. Tomko,  I am pleased to inform you that the colon polyp(s) removed during your recent colonoscopy was (were) found to be benign (no cancer detected) upon pathologic examination.  I recommend you have a repeat colonoscopy examination in _ years to look for recurrent polyps, as having colon polyps increases your risk for having recurrent polyps or even colon cancer in the future.  Should you develop new or worsening symptoms of abdominal pain, bowel habit changes or bleeding from the rectum or bowels, please schedule an evaluation with either your  primary care physician or with me.  Additional information/recommendations:  _x_ No further action with gastroenterology is needed at this time. Please      follow-up with your primary care physician for your other healthcare      needs.  __ Please call [phone] to schedule a return visit to review your      situation.  __ Please keep your follow-up visit as already scheduled.  __ Continue treatment plan as outlined the day of your exam.  Please call  us if you are having persistent problems or have questions about your condition that have not been fully answered at this time.  Sincerely,  Louis Meckel MD  This letter has been electronically signed by your physician.   Signed by Louis Meckel MD on 03/01/2009 at 10:38 AM   This report was created from the original endoscopy report, which was reviewed and signed by the above listed endoscopist.

## 2010-08-22 NOTE — Assessment & Plan Note (Signed)
Summary: b12 shot/kdc   Nurse Visit   Allergies: 1)  ! Prednisone  Medication Administration  Injection # 1:    Medication: Vit B12 1000 mcg    Diagnosis: ANEMIA (ICD-285.9)    Route: IM    Site: R deltoid    Exp Date: 04/2011    Lot #: [id number]    Mfr: American Regent    Patient tolerated injection without complications    Given by: Floydene Flock (August 12, 2009 1:11 PM)  Orders Added: 1)  Admin of Therapeutic Inj  intramuscular or subcutaneous [96372] 2)  Vit B12 1000 mcg [J3420]   Medication Administration  Injection # 1:    Medication: Vit B12 1000 mcg    Diagnosis: ANEMIA (ICD-285.9)    Route: IM    Site: R deltoid    Exp Date: 04/2011    Lot #: [id number]    Mfr: American Regent    Patient tolerated injection without complications    Given by: Floydene Flock (August 12, 2009 1:11 PM)  Orders Added: 1)  Admin of Therapeutic Inj  intramuscular or subcutaneous [96372] 2)  Vit B12 1000 mcg [J3420]

## 2010-08-22 NOTE — Letter (Signed)
Summary: Results Follow up Letter  [place] [facility]  [address] [place], [place] [address]   Phone: [phone]  Fax: [phone]    01/10/2009 MRN: [id number]  Justin [name] [address] [place], [place]  [address]  Dear Mr. Mongiello,  The following are the results of your recent test(s):  Test         Result    Pap Smear:        Normal _____  Not Normal _____ Comments: ______________________________________________________ Cholesterol: LDL(Bad cholesterol):         Your goal is less than:         HDL (Good cholesterol):       Your goal is more than: Comments:  ______________________________________________________ Mammogram:        Normal _____  Not Normal _____ Comments:  ___________________________________________________________________ Hemoccult:        Normal _____  Not normal _______ Comments:    _____________________________________________________________________ Other Tests: PLEASE SEE ATTACHED LABS DONE ON 01/07/2009    We routinely do not discuss normal results over the telephone.  If you desire a copy of the results, or you have any questions about this information we can discuss them at your next office visit.   Sincerely,

## 2010-08-22 NOTE — Assessment & Plan Note (Signed)
Summary: INJ//B-12//PH   Nurse Visit    Prior Medications: MECLIZINE HCL 25 MG TABS (MECLIZINE HCL) 1/2-1 TAB PO EVERY 8 HOURS as needed FOR DIZZINESS PROSCAR 5 MG  TABS (FINASTERIDE) 1 by mouth once daily CLONAZEPAM 0.5 MG  TABS (CLONAZEPAM) TAKE AS DIRECTED AGGRENOX 25-200 MG  CP12 (ASPIRIN-DIPYRIDAMOLE) 1 bid LIPITOR 10 MG  TABS (ATORVASTATIN CALCIUM) 1 by mouth qd DOXYCYCLINE MONOHYDRATE 100 MG  CAPS (DOXYCYCLINE MONOHYDRATE) 1 two times a day X 5 days then 1 once daily HYCODAN 5-1.5 MG/5ML  SYRP (HYDROCODONE-HOMATROPINE) 1 tsp q 6 hrs prn Current Allergies: ! PREDNISONE    Medication Administration  Injection # 1:    Medication: Vit B12 1000 mcg    Diagnosis: ANEMIA (ICD-285.9)    Route: IM    Site: L deltoid    Exp Date: 01/2010    Lot #: [id number]    Mfr: American Regent    Patient tolerated injection without complications    Given by: Floydene Flock CMA (May 05, 2008 3:01 PM)  Orders Added: 1)  Admin of Therapeutic Inj  intramuscular or subcutaneous [96372] 2)  Vit B12 1000 mcg Kallinikos.Fontana    ]  Medication Administration  Injection # 1:    Medication: Vit B12 1000 mcg    Diagnosis: ANEMIA (ICD-285.9)    Route: IM    Site: L deltoid    Exp Date: 01/2010    Lot #: [id number]    Mfr: American Regent    Patient tolerated injection without complications    Given by: Floydene Flock CMA (May 05, 2008 3:01 PM)  Orders Added: 1)  Admin of Therapeutic Inj  intramuscular or subcutaneous [96372] 2)  Vit B12 1000 mcg [J3420]

## 2010-08-22 NOTE — Assessment & Plan Note (Signed)
Summary: b-12/cbs   Nurse Visit   Allergies: 1)  ! Prednisone  Medication Administration  Injection # 1:    Medication: Vit B12 1000 mcg    Diagnosis: ANEMIA (ICD-285.9)    Route: IM    Site: L deltoid    Exp Date: 09/2011    Lot #: [id number]    Mfr: American Regent    Patient tolerated injection without complications    Given by: Shonna Chock CMA (March 28, 2010 2:20 PM)  Orders Added: 1)  Admin of Therapeutic Inj  intramuscular or subcutaneous [96372] 2)  Vit B12 1000 mcg [J3420]

## 2010-08-22 NOTE — Assessment & Plan Note (Signed)
Summary: b12 injection//fd   Nurse Visit     Allergies: 1)  ! Prednisone     Medication Administration  Injection # 1:    Medication: Vit B12 1000 mcg    Diagnosis: ANEMIA (ICD-285.9)    Route: IM    Site: L deltoid    Exp Date: 02/2010    Lot #: [id number]    Mfr: American Regent    Patient tolerated injection without complications    Given by: Floydene Flock CMA (November 10, 2008 2:08 PM)  Orders Added: 1)  Admin of Therapeutic Inj  intramuscular or subcutaneous [96372] 2)  Vit B12 1000 mcg [J3420]      Medication Administration  Injection # 1:    Medication: Vit B12 1000 mcg    Diagnosis: ANEMIA (ICD-285.9)    Route: IM    Site: L deltoid    Exp Date: 02/2010    Lot #: [id number]    Mfr: American Regent    Patient tolerated injection without complications    Given by: Floydene Flock CMA (November 10, 2008 2:08 PM)  Orders Added: 1)  Admin of Therapeutic Inj  intramuscular or subcutaneous [96372] 2)  Vit B12 1000 mcg [J3420]

## 2010-08-22 NOTE — Progress Notes (Signed)
Summary: mra results  ---- Converted from flag ---- ---- 04/14/2007 10:34 AM, Wendall Stade wrote: called patient , Dr. hopper has not seen results yet , called later to discuss with patient but there was no answer.  Left message on machine  ---- 04/14/2007 10:34 AM, Shonna Chock wrote:   ---- 04/14/2007 10:10 AM, Okey [place] wrote: please call pt re mri - he said he never got result  ---- [phone] -- cell [phone] ------------------------------  called patient and gave results of mra, and I sent a flag to alicia to follow up on neurology referral  Appended Document: mra results called patient back as he called and wanted to talk to me.  I explained that he can not drive until he sees the neurologist because the mra was non specific and did not explain the episode that he had.  He explained that his company was pushing him to get back to work.
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
## 2010-08-22 NOTE — Procedures (Signed)
Summary: colonoscopy   Colonoscopy  Procedure date:  06/06/2004  Findings:      Results: Diverticulosis.       Location:  [facility].    Comments:      Repeat colonoscopy in 5 years.  Patient Name: Justin [name], Justin [name]. MRN:  Procedure Procedures: Colonoscopy CPT: [id number].  Personnel: Endoscopist: Ulyess Mort, MD.  Referred By: Titus Dubin. Alwyn Ren, MD.  Exam Location: Exam performed in Outpatient Clinic. Outpatient  Patient Consent: Procedure, Alternatives, Risks and Benefits discussed, consent obtained, from patient. Consent was obtained by the RN.  Indications  Surveillance of: Adenomatous Polyp(s).  History  Current Medications: Patient is not currently taking Coumadin.  Pre-Exam Physical: Performed May 07, 2002. Entire physical exam was normal. Abdominal exam, Extremity exam, Mental status exam WNL.  Exam Exam: Extent of exam reached: Cecum, extent intended: Cecum.  The cecum was identified by appendiceal orifice and IC valve. Colon retroflexion performed. Images were not taken. ASA Classification: II. Tolerance: good.  Monitoring: Pulse and BP monitoring, Oximetry used. Supplemental O2 given.  Colon Prep Prep results: good.  Sedation Meds: Patient assessed and found to be appropriate for moderate (conscious) sedation. Fentanyl 75 mcg. given IV. Versed 7 mg. given IV.  Findings - DIVERTICULOSIS: Descending Colon to Sigmoid Colon. ICD9: Diverticulosis: 562.10. Comments: minnimal.   Assessment Abnormal examination, see findings above.  Diagnoses: 562.10: Diverticulosis.   Events  Unplanned Interventions: No intervention was required.  Unplanned Events: There were no complications. Plans Medication Plan: Continue current medications.  Patient Education: Patient given standard instructions for: Diverticulosis. Yearly hemoccult testing recommended. Patient instructed to get routine colonoscopy every 5 years.  Disposition: After  procedure patient sent to recovery. After recovery patient sent home.  This report was created from the original endoscopy report, which was reviewed and signed by the above listed endoscopist.    cc.William F Hopper,MD
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
## 2010-08-22 NOTE — Letter (Signed)
Summary: Results Follow up Letter  [facility]  [address] [place], [place] [address]   Phone: [phone]  Fax: [phone]    04/14/2007 MRN: [id number]  Justin [name] [address] [place], [place]  [address]  Dear Justin [name],  The following are the results of your recent test(s):  Test         Result    Pap Smear:        Normal _____  Not Normal _____ Comments: ______________________________________________________ Cholesterol: LDL(Bad cholesterol):         Your goal is less than:         HDL (Good cholesterol):       Your goal is more than: Comments:  ______________________________________________________ Mammogram:        Normal _____  Not Normal _____ Comments:  ___________________________________________________________________ Hemoccult:        Normal __X___  Not normal _______ Comments:    _____________________________________________________________________ Other Tests:    We routinely do not discuss normal results over the telephone.  If you desire a copy of the results, or you have any questions about this information we can discuss them at your next office visit.   Sincerely,

## 2010-08-22 NOTE — Letter (Signed)
Summary: [facility]  [facility]   Imported By: Lanelle Bal 10/21/2008 10:30:58  _____________________________________________________________________  External Attachment:    Type:   Image     Comment:   External Document

## 2010-08-22 NOTE — Assessment & Plan Note (Signed)
Summary: YEARLY EXAM AND FASTING LABS///[facility]   Vital Signs:  Patient profile:   77 year old male Height:      70.75 inches Weight:      183 pounds Temp:     97.9 degrees F oral Pulse rate:   68 / minute Resp:     20 per minute BP sitting:   110 / 62  (left arm)  Vitals Entered By: Jeremy Johann CMA (March 31, 2010 11:29 AM) CC: yearly exam, fasting, Lipid Management   Primary Care Provider:  Marga Melnick, MD  CC:  yearly exam, fasting, and Lipid Management.  History of Present Illness: Here for Medicare AWV: 1.Risk factors based on Past M, S, F history:dyslipidemia; B12 deficiency; TIA(PMH) 2.Physical Activities: working physically on farm  3.Depression/mood: no issues 4.Hearing: decreased hearing to whisper on L @ 6 ft 5.ADL's: no limitations  6.Fall Risk: denied  7.Home Safety: no issues  8.Height, weight, &visual acuity:grossly intact @6  ft w/o lenses 9.Counseling: none requested ; Living Will in place 10.Labs ordered based on risk factors: see Orders 11. Referral Coordination: none requseted  12.Care Plan: see Instructions 13.  Cognitive Assessment: Oriented X 3 ; memory & recall intact  ;math ability good; affect & mood normal.                                                                                                                                        Hyperlipidemia Follow-Up      This is an 77 year old man who presents for Hyperlipidemia follow-up.  The patient reports constipation, but denies muscle aches, GI upset, abdominal pain, flushing, itching, diarrhea, and fatigue.  The patient denies the following symptoms: chest pain/pressure, exercise intolerance, dypsnea, palpitations, syncope, and pedal edema.  Compliance with medications (by patient report) has been near 100%.  Dietary compliance has been good.  Adjunctive measures currently used by the patient include ASA.    Lipid Management History:      Positive NCEP/ATP III risk factors include male age  78 years old or older and prior stroke (or TIA).  Negative NCEP/ATP III risk factors include non-diabetic, no family history for ischemic heart disease, non-tobacco-user status, non-hypertensive, no ASHD (atherosclerotic heart disease), no peripheral vascular disease, and no history of aortic aneurysm.     Preventive Screening-Counseling & Management  Alcohol-Tobacco     Alcohol drinks/day: 0     Year Started: 1943     Year Quit: 1966  Caffeine-Diet-Exercise     Caffeine use/day: 2 cups/ day     Diet Comments: no diet  Hep-HIV-STD-Contraception     Dental Visit-last 6 months yes     Sun Exposure-Excessive: no  Safety-Violence-Falls     Seat Belt Use: yes     Firearms in the Home: firearms in the home     Firearm Counseling: not indicated; uses recommended firearm safety measures  Smoke Detectors: yes      Blood Transfusions:  no.        Travel History:  no recent travel.    Current Medications (verified): 1)  Proscar 5 Mg  Tabs (Finasteride) .Marland Kitchen.. 1 By Mouth Once Daily 2)  Zocor 20 Mg Tabs (Simvastatin) .... Take 1 Tablet By Mouth Once A Day 3)  Bayer Aspirin 325 Mg Tabs (Aspirin) .Marland Kitchen.. 1 By Mouth Once Daily  Allergies (verified): 1)  ! Prednisone  Past History:  Past Medical History: Benign prostatic hypertrophy, Dr Karilyn Cota Hyperlipidemia: LDL goal = < 100 Nephrolithiasis, hx of Transient ischemic attack, PMH  of; Dr Pearlean Brownie Colonic polyps, PMH of Diverticulosis, colon Anemia Skin cancer,PMH  of, seeing Dr Terri Piedra  Past Surgical History: Lithotripsy, Dr Logan Bores Hemorrhoidectomy Colon polypectomy X 2 , adenomatous , Dr Arlyce Dice Cataract extraction, bilateral  Family History: Father:CHF, ? MI Mother: colon cancer,DM Siblings: bro: prostate cancer,CAD, DM  Social History: Married, 2 boys, 2 girls Retired Former Smoker Alcohol Use - no Daily Caffeine Use 2 cups Illicit Drug Use - no Caffeine use/day:  2 cups/ day Dental Care w/in 6 mos.:  yes Sun  Exposure-Excessive:  no Seat Belt Use:  yes Blood Transfusions:  no  Review of Systems  The patient denies anorexia, fever, hoarseness, prolonged cough, headaches, hemoptysis, abdominal pain, melena, hematochezia, severe indigestion/heartburn, unusual weight change, abnormal bleeding, enlarged lymph nodes, and angioedema.         Weight minimally down . Dr Logan Bores seen annually. Neuro:  No neurologic symptoms @ present off Aggrenox & on ASA (325 mg coated ).  Physical Exam  General:  Appears younger than age,well-nourished; alert,appropriate and cooperative throughout examination Head:  Normocephalic and atraumatic without obvious abnormalities. Pattern  alopecia Eyes:  No corneal or conjunctival inflammation noted. Perrla. Funduscopic exam benign, without hemorrhages, exudates or papilledema. Ptosis bilaterally Ears:  External ear exam shows no significant lesions or deformities.  Otoscopic examination reveals clear canals, tympanic membranes are intact bilaterally without bulging, retraction, inflammation or discharge. Hearing is grossly normal bilaterally. Nose:  External nasal examination shows no deformity or inflammation. Nasal mucosa are pink and moist without lesions or exudates.Marked septal deviation Mouth:  Oral mucosa and oropharynx without lesions or exudates.  Teeth in good repair. Neck:  No deformities, masses, or tenderness noted. Lungs:  Normal respiratory effort, chest expands symmetrically. Lungs are clear to auscultation, no crackles or wheezes. Heart:  regular rhythm, no murmur, no gallop, no rub, no JVD, no HJR, and bradycardia.  S4  Abdomen:  Bowel sounds positive,abdomen soft and non-tender without masses, organomegaly or hernias noted. Genitalia:  Dr Logan Bores Msk:  No deformity or scoliosis noted of thoracic or lumbar spine.   Pulses:  R and L carotid,radial,dorsalis pedis and posterior tibial pulses are full and equal bilaterally Extremities:  No clubbing, cyanosis,  edema, or deformity noted with normal full range of motion of all joints.   Mild crepitus of knees Neurologic:  alert & oriented X3 and DTRs symmetrical and normal.   Skin:  Intact without suspicious lesions or rashes Cervical Nodes:  No lymphadenopathy noted Axillary Nodes:  No palpable lymphadenopathy Psych:  memory intact for recent and remote, normally interactive, good eye contact, not anxious appearing, and not depressed appearing.     Impression & Recommendations:  Problem # 1:  PREVENTIVE HEALTH CARE (ICD-V70.0)  Orders: [facility] -Subsequent Annual Wellness Visit [phone]) UA Dipstick w/o Micro (manual) (98119)  Problem # 2:  HYPERLIPIDEMIA (ICD-272.2)  His updated medication  list for this problem includes:    Zocor 20 Mg Tabs (Simvastatin) .Marland Kitchen... Take 1 tablet by mouth once a day  Orders: EKG w/ Interpretation (93000) Venipuncture ([address]) TLB-Lipid Panel (80061-LIPID) TLB-BMP (Basic Metabolic Panel-BMET) (80048-METABOL) TLB-Hepatic/Liver Function Pnl (80076-HEPATIC) TLB-TSH (Thyroid Stimulating Hormone) (84443-TSH)  Problem # 3:  ANEMIA (ICD-285.9)  B- 12 injections  Orders: Venipuncture (60454) TLB-CBC Platelet - w/Differential (85025-CBCD)  Problem # 4:  TRANSIENT ISCHEMIC ATTACK, HX OF (ICD-V12.50) No new Neuro symptoms Orders: Venipuncture (09811)  Complete Medication List: 1)  Proscar 5 Mg Tabs (Finasteride) .Marland Kitchen.. 1 by mouth once daily 2)  Zocor 20 Mg Tabs (Simvastatin) .... Take 1 tablet by mouth once a day 3)  Bayer Aspirin 325 Mg Tabs (Aspirin) .Marland Kitchen.. 1 by mouth once daily 4)  Saw Palmetto  .... Take once daily  Lipid Assessment/Plan:      Based on NCEP/ATP III, the patient's risk factor category is "history of coronary disease, peripheral vascular disease, cerebrovascular disease, or aortic aneurysm".  The patient's lipid goals are as follows: Total cholesterol goal is 200; LDL cholesterol goal is 100; HDL cholesterol goal is 40; Triglyceride goal is 150.     Patient Instructions: 1)  It is important that you exercise regularly at least 20 minutes 5 times a week. If you develop chest pain, have severe difficulty breathing, or feel very tired , stop exercising immediately and seek medical attention.Further recommendations will depend upon lab results. 2)  Take an Aspirin every day.   Immunization History:  Tetanus/Td Immunization History:    Tetanus/Td:  historical (08/13/2002)  Laboratory Results   Urine Tests   Date/Time Reported: March 31, 2010 12:46 PM   Routine Urinalysis   Color: yellow Appearance: Clear Glucose: negative   (Normal Range: Negative) Bilirubin: negative   (Normal Range: Negative) Ketone: negative   (Normal Range: Negative) Spec. Gravity: 1.010   (Normal Range: 1.003-1.035) Blood: negative   (Normal Range: Negative) pH: 7.0   (Normal Range: 5.0-8.0) Protein: negative   (Normal Range: Negative) Urobilinogen: negative   (Normal Range: 0-1) Nitrite: negative   (Normal Range: Negative) Leukocyte Esterace: negative   (Normal Range: Negative)    Comments: Floydene Flock  March 31, 2010 12:46 PM

## 2010-08-22 NOTE — Assessment & Plan Note (Signed)
Summary: pt lost his voice and coughing at night//ca   Vital Signs:  Patient Profile:   77 Years Old Male Weight:      187.25 pounds Temp:     98.4 degrees F oral Pulse rate:   64 / minute Resp:     16 per minute BP sitting:   110 / 68  (left arm)  Pt. in pain?   no  Vitals Entered By: Ardyth Man (Dec 17, 2007 2:11 PM)                  PCP:  HOpp  Chief Complaint:  HOARSE AND COUGHING AT NIGHT and URI symptoms.  History of Present Illness:  URI Symptoms; Rx: Tylenol, Robitussin      This is an 77 year old man who presents with URI symptoms since 12/14/07.  The patient reports clear nasal discharge and dry cough, but denies nasal congestion, purulent nasal discharge, sore throat, productive cough, earache, and sick contacts.  Associated symptoms include fever, low-grade fever (<100.5 degrees), and use of an antipyretic.  The patient denies stiff neck, dyspnea, wheezing, rash, vomiting, diarrhea, and response to antipyretic.  The patient also reports itchy watery eyes, sneezing, seasonal symptoms, muscle aches, and severe fatigue.  The patient denies itchy throat, response to antihistamine, and headache.  The patient denies the following risk factors for Strep sinusitis: unilateral facial pain, unilateral nasal discharge, tooth pain, Strep exposure, and tender adenopathy. He is concerned he has Strep throat     Current Allergies: ! PREDNISONE     Review of Systems  Eyes      Denies discharge, eye pain, and red eye.  ENT      Complains of hoarseness.      Denies sore throat.      No facial pain or purulence   Physical Exam  General:     Well-developed,well-nourished,in no acute distress; alert,appropriate and cooperative throughout examination Eyes:     No corneal or conjunctival inflammation noted. EOMI. Perrla. Vision grossly normal. Ears:     External ear exam shows no significant lesions or deformities.  Otoscopic examination reveals clear canals, tympanic  membranes are intact bilaterally without bulging, retraction, inflammation or discharge. Hearing is grossly normal bilaterally. Nose:     External nasal examination shows no deformity or inflammation. Nasal mucosa are pink and moist without lesions or exudates. Mouth:     Oral mucosa and oropharynx without lesions or exudates.  Very hoarse Lungs:     Normal respiratory effort, chest expands symmetrically. Lungs are clear to auscultation, no crackles or wheezes. Cervical Nodes:     No lymphadenopathy noted Axillary Nodes:     No palpable lymphadenopathy    Impression & Recommendations:  Problem # 1:  LARYNGITIS-ACUTE (ICD-464.00)  Orders: Rapid Strep (04540)   Problem # 2:  URI (ICD-465.9)  His updated medication list for this problem includes:    Hycodan 5-1.5 Mg/73ml Syrp (Hydrocodone-homatropine) .Marland Kitchen... 1 tsp q 6 hrs prn   Complete Medication List: 1)  Meclizine Hcl 25 Mg Tabs (Meclizine hcl) .... 1/2-1 tab po every 8 hours as needed for dizziness 2)  Proscar 5 Mg Tabs (Finasteride) .Marland Kitchen.. 1 by mouth once daily 3)  Clonazepam 0.5 Mg Tabs (Clonazepam) .... Take as directed 4)  Aggrenox 25-200 Mg Cp12 (Aspirin-dipyridamole) .Marland Kitchen.. 1 bid 5)  Lipitor 10 Mg Tabs (Atorvastatin calcium) .Marland Kitchen.. 1 by mouth qd 6)  Doxycycline Monohydrate 100 Mg Caps (Doxycycline monohydrate) .Marland Kitchen.. 1 two times a day x  5 days then 1 once daily 7)  Hycodan 5-1.5 Mg/75ml Syrp (Hydrocodone-homatropine) .Marland Kitchen.. 1 tsp q 6 hrs prn   Patient Instructions: 1)  Voice rest . 2)  Drink as much fluid as you can tolerate for the next few days.   Prescriptions: HYCODAN 5-1.5 MG/5ML  SYRP (HYDROCODONE-HOMATROPINE) 1 tsp q 6 hrs prn  #120cc x 0   Entered and Authorized by:   Marga Melnick MD   Signed by:   Marga Melnick MD on 12/17/2007   Method used:   Print then Give to Patient   RxID:   [id number] DOXYCYCLINE MONOHYDRATE 100 MG  CAPS (DOXYCYCLINE MONOHYDRATE) 1 two times a day X 5 days then 1 once daily  #15 x  0   Entered and Authorized by:   Marga Melnick MD   Signed by:   Marga Melnick MD on 12/17/2007   Method used:   Print then Give to Patient   RxID:   [id number]  ] Laboratory Results    Other Tests  Rapid Strep: negative

## 2010-08-22 NOTE — Assessment & Plan Note (Signed)
Summary: b-12/cbs   Nurse Visit    Prior Medications: MECLIZINE HCL 25 MG TABS (MECLIZINE HCL) 1/2-1 TAB PO EVERY 8 HOURS as needed FOR DIZZINESS PROSCAR 5 MG  TABS (FINASTERIDE) 1 by mouth once daily CLONAZEPAM 0.5 MG  TABS (CLONAZEPAM) TAKE AS DIRECTED AGGRENOX 25-200 MG  CP12 (ASPIRIN-DIPYRIDAMOLE) 1 bid LIPITOR 10 MG  TABS (ATORVASTATIN CALCIUM) 1 by mouth qd DOXYCYCLINE MONOHYDRATE 100 MG  CAPS (DOXYCYCLINE MONOHYDRATE) 1 two times a day X 5 days then 1 once daily HYCODAN 5-1.5 MG/5ML  SYRP (HYDROCODONE-HOMATROPINE) 1 tsp q 6 hrs prn Current Allergies: ! PREDNISONE    Medication Administration  Injection # 1:    Medication: Vit B12 1000 mcg    Diagnosis: ANEMIA (ICD-285.9)    Route: IM    Site: L deltoid    Exp Date: 12/21/2009    Lot #: [id number]    Mfr: American Regent    Given by: Doristine Devoid (April 05, 2008 11:23 AM)  Orders Added: 1)  Vit B12 1000 mcg [J3420] 2)  Admin of Therapeutic Inj  intramuscular or subcutaneous Lepidus.Putnam    ]   Medication Administration  Injection # 1:    Medication: Vit B12 1000 mcg    Diagnosis: ANEMIA (ICD-285.9)    Route: IM    Site: L deltoid    Exp Date: 12/21/2009    Lot #: [id number]    Mfr: American Regent    Given by: Doristine Devoid (April 05, 2008 11:23 AM)  Orders Added: 1)  Vit B12 1000 mcg [J3420] 2)  Admin of Therapeutic Inj  intramuscular or subcutaneous [32440]

## 2010-08-22 NOTE — Progress Notes (Signed)
Summary: :LETTER DROPPED OFF:HOP/KATHY SEE CHART TO YOU  Phone Note Call from Patient   Action Taken: Patient advised to call 911 Summary of Call: PT DROPPED OFF A LETTER TODAY : PT SAID HE HAS BEEN OUT OF WORK FROM SEP 8TH THRU OCT 31 08. HE SAID HE COMPLETED ALL HIS TEST. THE MEDICATION HE HAVE BEEN TAKING AGGRENOX 25 MG/200 CAPS HAS HELPED HIM. HE SAID IN HIS OPION HE THINK HE CAN GO BACK TO WORK BUT DO LESS. HE WANT TP KNOW IF WE CAN CHECK TO GET HIS FINAL RESULTS. HE WANTS TO GO BACK TO WORK NOV 2. PT HAS A CPX APPT ON NOV 4 HE WANTS TO KNOW IF HE NEEDS TO CANCEL THE APPT.  Cleotis Lema NWG [phone] Initial call taken by: Vanessa Swaziland,  May 19, 2007 2:32 PM  Follow-up for Phone Call        The CPX can be cancelled. The Neurologist must clear him to return to driving. When is Neuro consult, Helmut Muster Follow-up by: Marga Melnick MD,  May 20, 2007 6:09 PM  Additional Follow-up for Phone Call Additional follow up Details #1::        hopp, his consult is scheduled with dr Pearlean Brownie on 11.04.08 @ 10:30 at [facility] and pt is aware of this. 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  lmom ..................................................................Marland KitchenChrae Malloy  May 22, 2007 12:42 PM  Follow-up by: Marga Melnick MD,  May 21, 2007 5:53 PM  Additional Follow-up for Phone Call Additional follow up Details #3:: Details for Additional Follow-up Action Taken: spoke with pt informed per dr hopper will need neuro assessment clearance before driving informed pt said he is ready to get on with it, informed pt has ov tue  11/4 with [facility] pt said has another dste 11/19, pt will call [facility] to confirm date s/w alicia said pt had appt  11/4 Additional Follow-up by: Kandice Hams,  May 22, 2007 2:00 PM

## 2010-08-22 NOTE — Assessment & Plan Note (Signed)
Summary: b12 inj/cbs   Nurse Visit   Allergies: 1)  ! Prednisone  Medication Administration  Injection # 1:    Medication: Vit B12 1000 mcg    Diagnosis: ANEMIA (ICD-285.9)    Route: IM    Site: L deltoid    Exp Date: 09/21/2011    Lot #: [id number]    Mfr: American Regent    Given by: Doristine Devoid (January 20, 2010 1:44 PM)  Orders Added: 1)  Vit B12 1000 mcg [J3420] 2)  Admin of Therapeutic Inj  intramuscular or subcutaneous [96372]   Medication Administration  Injection # 1:    Medication: Vit B12 1000 mcg    Diagnosis: ANEMIA (ICD-285.9)    Route: IM    Site: L deltoid    Exp Date: 09/21/2011    Lot #: [id number]    Mfr: American Regent    Given by: Doristine Devoid (January 20, 2010 1:44 PM)  Orders Added: 1)  Vit B12 1000 mcg [J3420] 2)  Admin of Therapeutic Inj  intramuscular or subcutaneous [16109]

## 2010-08-22 NOTE — Assessment & Plan Note (Signed)
Summary: b-12 inj/cbs   Nurse Visit   Allergies: 1)  ! Prednisone  Medication Administration  Injection # 1:    Medication: Vit B12 1000 mcg    Diagnosis: ANEMIA (ICD-285.9)    Route: IM    Site: L deltoid    Exp Date: 10/2010    Lot #: [id number]    Mfr: American Regent    Patient tolerated injection without complications    Given by: Floydene Flock CMA (February 08, 2009 3:36 PM)  Orders Added: 1)  Admin of Therapeutic Inj  intramuscular or subcutaneous [96372] 2)  Vit B12 1000 mcg [J3420]     Medication Administration  Injection # 1:    Medication: Vit B12 1000 mcg    Diagnosis: ANEMIA (ICD-285.9)    Route: IM    Site: L deltoid    Exp Date: 10/2010    Lot #: [id number]    Mfr: American Regent    Patient tolerated injection without complications    Given by: Floydene Flock CMA (February 08, 2009 3:36 PM)  Orders Added: 1)  Admin of Therapeutic Inj  intramuscular or subcutaneous [96372] 2)  Vit B12 1000 mcg [J3420]

## 2010-08-22 NOTE — Assessment & Plan Note (Signed)
Summary: SORE THROAT,LOSS OF VOICE/RH.....   Vital Signs:  Patient profile:   77 year old male Weight:      186.6 pounds Temp:     98.4 degrees F oral Pulse rate:   76 / minute Resp:     17 per minute BP sitting:   122 / 84  (left arm) Cuff size:   regular  Vitals Entered By: Shonna Chock (June 23, 2009 2:59 PM) CC: Cold Symptoms since Sunday: Voice Loss, Hoarse, Facial pressure, & cough. Tried OTC meds Comments REVIEWED MED LIST, PATIENT AGREED DOSE AND INSTRUCTION CORRECT    Primary Care Provider:  Rosielee Corporan, MD  CC:  Cold Symptoms since Sunday: Voice Loss, Hoarse, Facial pressure, and & cough. Tried OTC meds.  History of Present Illness: Sneezing & rhinitis x 4 days; ST X 2 days. Laryngitis as of last night. Frontal headache resolving & ocular pain has resolved . Dry cough. Rx: OTC nasal spray, Robiussin. He had flu shot last month.  Allergies: 1)  ! Prednisone  Review of Systems General:  Denies chills, fever, and sweats. Eyes:  Denies discharge, eye pain, and red eye. ENT:  Complains of nasal congestion; denies ear discharge and earache; No facial pain or purulence. Resp:  Denies shortness of breath, sputum productive, and wheezing. MS:  Denies joint pain and muscle aches.  Physical Exam  General:  Appears younger than age,in no acute distress; alert,appropriate and cooperative throughout examination Ears:  External ear exam shows no significant lesions or deformities.  Otoscopic examination reveals clear canals, tympanic membranes are intact bilaterally without bulging, retraction, inflammation or discharge. Hearing is grossly normal bilaterally.Minor TM scarring Nose:  External nasal examination shows no deformity or inflammation. Nasal mucosa are dry  without lesions or exudates. Mouth:  Oral mucosa and oropharynx without lesions or exudates.  Hoarse. Uvular & pharyngeal erythema.   Lungs:  Normal respiratory effort, chest expands symmetrically. Lungs are  clear to auscultation, no crackles or wheezes. Cervical Nodes:  No lymphadenopathy noted Axillary Nodes:  No palpable lymphadenopathy   Impression & Recommendations:  Problem # 1:  ACUTE LARYNGITIS, WITHOUT MENTION OF OBSTRUCTIO (ICD-464.00)  Problem # 2:  BRONCHITIS-ACUTE (ICD-466.0)  His updated medication list for this problem includes:    Azithromycin 250 Mg Tabs (Azithromycin) ..... As per pack  Problem # 3:  URI (ICD-465.9)  Complete Medication List: 1)  Proscar 5 Mg Tabs (Finasteride) .... 1 by mouth once daily 2)  Aggrenox 25-200 Mg Cp12 (Aspirin-dipyridamole) .... Take 1 tablet by mouth two times a day. patient will stop once he finishes  current supply and start asa 325mg. 3)  Zocor 20 Mg Tabs (Simvastatin) .... Take 1 tablet by mouth once a day 4)  Azithromycin 250 Mg Tabs (Azithromycin) .... As per pack  Patient Instructions: 1)  Neti pot once daily as needed for head congestion.Voice rest as discussed. 2)  Drink as much fluid as you can tolerate for the next few days. Prescriptions: AZITHROMYCIN 250 MG TABS (AZITHROMYCIN) as per pack  #1 x 0   Entered and Authorized by:   Stanislaus Kaltenbach MD   Signed by:   Eyden Dobie MD on 06/23/2009   Method used:   Faxed to ...       [id number] ([facility]       [address]       [place], [place]  [address]  Ph: [phone] or [phone]       Fax: [phone]   RxID:   [id number]

## 2010-08-22 NOTE — Letter (Signed)
Summary: Primary Care Consult Scheduled Letter  [facility]  [address] [place], [place] [address]   Phone: [phone]  Fax: [phone]      09/29/2007 MRN: [id number]  Justin [name] [address] [place], [place]  [address]    Dear Mr. Kochanowski,      We have scheduled an appointment for you.  At the recommendation of Dr.HOPPER, we have scheduled you a consult with DR NOLAN-DERMATOLOGY on 03.16.09 @ 9:10.  Their phone number is [phone].  If this appointment day and time is not convenient for you, please feel free to call the office of the doctor you are being referred to at the number listed above and reschedule the appointment.     It is important for you to keep your scheduled appointments. We are here to make sure you are given good patient care. If yu have questions or you have made changes to your appointment, please notify us at  [phone], ask for [facility].    Thank you,  Patient Care Coordinator [place] at [facility]

## 2010-08-22 NOTE — Assessment & Plan Note (Signed)
Summary: b12//tl   Nurse Visit        Medication Administration  Injection # 1:    Medication: Vit B12 1000 mcg    Diagnosis: ANEMIA (ICD-285.9)    Route: IM    Site: L deltoid    Exp Date: 12/21/2008    Lot #: [id number]    Mfr: AMERICAN REGENT    Patient tolerated injection without complications    Given by: Floydene Flock (March 28, 2007 3:15 PM)  Orders Added: 1)  Vit B12 1000 mcg [J3420] 2)  Admin of Therapeutic Inj  intramuscular or subcutaneous [90772]      Medication Administration  Injection # 1:    Medication: Vit B12 1000 mcg    Diagnosis: ANEMIA (ICD-285.9)    Route: IM    Site: L deltoid    Exp Date: 12/21/2008    Lot #: [id number]    Mfr: AMERICAN REGENT    Patient tolerated injection without complications    Given by: Floydene Flock (March 28, 2007 3:15 PM)  Orders Added: 1)  Vit B12 1000 mcg [J3420] 2)  Admin of Therapeutic Inj  intramuscular or subcutaneous [90772]

## 2010-08-22 NOTE — Letter (Signed)
Summary: Patient Notice- Polyp Results  [facility]  [address] [place], [place] [address]   Phone: [phone]  Fax: [phone]        March 01, 2009 MRN: [id number]    Justin [name] [address] [place], [place]  [address]    Dear Justin [name],  I am pleased to inform you that the colon polyp(s) removed during your recent colonoscopy was (were) found to be benign (no cancer detected) upon pathologic examination.  I recommend you have a repeat colonoscopy examination in _ years to look for recurrent polyps, as having colon polyps increases your risk for having recurrent polyps or even colon cancer in the future.  Should you develop new or worsening symptoms of abdominal pain, bowel habit changes or bleeding from the rectum or bowels, please schedule an evaluation with either your primary care physician or with me.  Additional information/recommendations:  _x_ No further action with gastroenterology is needed at this time. Please      follow-up with your primary care physician for your other healthcare      needs.  __ Please call [phone] to schedule a return visit to review your      situation.  __ Please keep your follow-up visit as already scheduled.  __ Continue treatment plan as outlined the day of your exam.  Please call us if you are having persistent problems or have questions about your condition that have not been fully answered at this time.  Sincerely,  Louis Meckel MD  This letter has been electronically signed by your physician.

## 2010-08-22 NOTE — Progress Notes (Signed)
Summary: CONTACT WITH SOMEONE WITH MRSA  Phone Note Call from Patient Call back at [facility] Phone [phone]   Caller: Patient Summary of Call: PT AND HIS WIFE SHAKE HAND WITH A PERSON WHO HAS MRSA AND THEY WANT TO KNOW  WHAT SHOULD THEY DO. BECAUSE THEY JUST FOUND OUT THAT IT IS CONTAGIOUS TODAY. SO HE CALL THE DOC OFFICE TO SEE WHAT PRECAUTION SHOULD HE AND HIS WIFE LOOK OUT FOR .  Initial call taken by: Freddy Jaksch,  February 10, 2010 11:20 AM  Follow-up for Phone Call        I spoke with patient's wife and the person with MRSA has it on 1 finger and on his neck and she hugged his neck and her husband shook his hand. The area's of concern were not oozing   Per Dr.Hopper patient and wife need to make sure that they washed there hands carefully and they should be fine. Dr.Hopper also indicated that it is usually transmitted through lining, towles, ect. Follow-up by: Shonna Chock CMA,  February 10, 2010 3:17 PM

## 2010-08-22 NOTE — Progress Notes (Signed)
Summary: TRIAGE   Phone Note Call from Patient Call back at Home Phone [phone]   Caller: Patient Call For: Dr. Arlyce Dice Reason for Call: Talk to Nurse Summary of Call: pt just received a letter to sch a COL, but pt just had a COL this year with Dr. Arlyce Dice... please give pt confirmation if this is a mistake and when he is really due for his next COL Initial call taken by: Vallarie Mare,  May 12, 2009 2:47 PM  Follow-up for Phone Call        DR.Sumie Remsen--Looks like pt. doesn't need another Colonoscopy or recall, is that correct?  Follow-up by: Laureen Ochs LPN,  May 12, 2009 3:13 PM  Additional Follow-up for Phone Call Additional follow up Details #1::        correct Additional Follow-up by: Louis Meckel MD,  May 12, 2009 3:35 PM     Appended Document: TRIAGE Pt. wife notifed that [name] doesn't need another Colonoscopy. She was instructed to have pt. call back as needed.

## 2010-08-22 NOTE — Assessment & Plan Note (Signed)
Summary: B12 INJECTION/RH.....   Nurse Visit   Allergies: 1)  ! Prednisone  Medication Administration  Injection # 1:    Medication: Vit B12 1000 mcg    Diagnosis: ANEMIA (ICD-285.9)    Route: IM    Site: L deltoid    Exp Date: 11/20/2009    Lot #: [id number]    Mfr: American Regent    Given by: Doristine Devoid (March 11, 2009 11:19 AM)  Orders Added: 1)  Vit B12 1000 mcg [J3420] 2)  Admin of Therapeutic Inj  intramuscular or subcutaneous [96372]   Medication Administration  Injection # 1:    Medication: Vit B12 1000 mcg    Diagnosis: ANEMIA (ICD-285.9)    Route: IM    Site: L deltoid    Exp Date: 11/20/2009    Lot #: [id number]    Mfr: American Regent    Given by: Doristine Devoid (March 11, 2009 11:19 AM)  Orders Added: 1)  Vit B12 1000 mcg [J3420] 2)  Admin of Therapeutic Inj  intramuscular or subcutaneous [54098]

## 2010-08-22 NOTE — Assessment & Plan Note (Signed)
Summary: b12 inj//lch   Nurse Visit   Allergies: 1)  ! Prednisone  Medication Administration  Injection # 1:    Medication: Vit B12 1000 mcg    Diagnosis: ANEMIA (ICD-285.9)    Route: IM    Site: L deltoid    Exp Date: 08/2011    Lot #: [id number]    Mfr: American Regent    Patient tolerated injection without complications    Given by: Shonna Chock (November 17, 2009 10:55 AM)  Orders Added: 1)  Admin of Therapeutic Inj  intramuscular or subcutaneous [96372] 2)  Vit B12 1000 mcg [J3420]

## 2010-08-22 NOTE — Assessment & Plan Note (Signed)
Summary: b12 shot./flu shot/kdc   Nurse Visit   Allergies: 1)  ! Prednisone  Medication Administration  Injection # 1:    Medication: Vit B12 1000 mcg    Diagnosis: ANEMIA (ICD-285.9)    Route: IM    Site: L deltoid    Patient tolerated injection without complications    Given by: Floydene Flock (June 13, 2009 1:28 PM)  Orders Added: 1)  Admin of Therapeutic Inj  intramuscular or subcutaneous [96372] 2)  Vit B12 1000 mcg [J3420] 3)  Flu Vaccine 82yrs + [90658] 4)  Administration Flu vaccine - MCR [G0008]   Medication Administration  Injection # 1:    Medication: Vit B12 1000 mcg    Diagnosis: ANEMIA (ICD-285.9)    Route: IM    Site: L deltoid    Patient tolerated injection without complications    Given by: Floydene Flock (June 13, 2009 1:28 PM)  Orders Added: 1)  Admin of Therapeutic Inj  intramuscular or subcutaneous [96372] 2)  Vit B12 1000 mcg [J3420] 3)  Flu Vaccine 34yrs + [90658] 4)  Administration Flu vaccine - MCR [G0008] Flu Vaccine Consent Questions     Do you have a history of severe allergic reactions to this vaccine? no    Any prior history of allergic reactions to egg and/or gelatin? no    Do you have a sensitivity to the preservative Thimersol? no    Do you have a past history of Guillan-Barre Syndrome? no    Do you currently have an acute febrile illness? no    Have you ever had a severe reaction to latex? no    Vaccine information given and explained to patient? yes    Are you currently pregnant? no    Lot Number:[id number]   Exp Date:01/19/2010   Site Given rightt Deltoid IM 1000 mcg [J3420] 3)  Flu Vaccine 16yrs + [90658] 4)  Administration Flu vaccine - MCR [G0008]  .lbmedflu

## 2010-08-22 NOTE — Letter (Signed)
Summary: Results Follow-up Letter  [place] [facility]  [address] [place], [place] [address]   Phone: [phone]  Fax: [phone]    05/18/2008        Justin [name] [address] [place], [place]  [address]  Dear Mr. Slagel,   The following are the results of your recent test(s):  Test     Result     Pap Smear    Normal_______  Not Normal_____       Comments: _________________________________________________________ Cholesterol LDL(Bad cholesterol):          Your goal is less than:         HDL (Good cholesterol):        Your goal is more than: _________________________________________________________ Other Tests:   _________________________________________________________  Please call for an appointment Or _Please see attached lab report.________________________________________________________ _________________________________________________________ _________________________________________________________  Sincerely,  Ardyth Man [place] at [facility]

## 2010-08-22 NOTE — Assessment & Plan Note (Signed)
Summary: B12/KN   Nurse Visit   Allergies: 1)  ! Prednisone  Medication Administration  Injection # 1:    Medication: Vit B12 1000 mcg    Diagnosis: ANEMIA (ICD-285.9)    Route: IM    Site: L deltoid    Exp Date: 09/2011    Lot #: [id number]    Mfr: American Regent    Patient tolerated injection without complications    Given by: Shonna Chock CMA (February 20, 2010 10:48 AM)  Orders Added: 1)  Vit B12 1000 mcg [J3420] 2)  Admin of Therapeutic Inj  intramuscular or subcutaneous [16109]

## 2010-08-22 NOTE — Assessment & Plan Note (Signed)
Summary: pain under right rib/kn   Vital Signs:  Patient profile:   77 year old male Weight:      186.4 pounds BMI:     26.09 O2 Sat:      99 % on Room air Temp:     97.5 degrees F oral Pulse rate:   60 / minute Resp:     16 per minute BP sitting:   122 / 70  (left arm) Cuff size:   regular  Vitals Entered By: Shonna Chock (January 26, 2010 11:40 AM)  O2 Flow:  Room air CC: Pain on right side near rib area. Patient injured that side a little bit ago Comments REVIEWED MED LIST, PATIENT AGREED DOSE AND INSTRUCTION CORRECT    Primary Care Provider:  Marga Melnick, MD  CC:  Pain on right side near rib area. Patient injured that side a little bit ago.  History of Present Illness:       This is an 77 year old male who presents with Chest pain since injury 01/18/2010. He fell 2.5 feet from cart drawn behind a lawnmower, striking R chest.  The patient denies exertional chest pain, nausea, vomiting, diaphoresis, shortness of breath, palpitations, dizziness, light headedness, syncope, and indigestion.  The pain is described as intermittent and dull.  The pain starts @  the right anterior axillary line.  The pain radiates to the right anterior chest with change to RLDP > LLDP.  Episodes of chest pain last < 1 minute.  The pain is brought on or made worse only  by upper body movement as noted; it resolves with supine position. Heat ? helped.  The pain is relieved or improved with change in position.Pain is improving w/o meds. PMH of kidney stones    Allergies: 1)  ! Prednisone  Review of Systems General:  Denies chills, fever, and sweats. CV:  Denies difficulty breathing at night and difficulty breathing while lying down. Resp:  Denies chest pain with inspiration, coughing up blood, shortness of breath, and sputum productive.  Physical Exam  General:  Appears younger than age,well-nourished,in no acute distress; alert,appropriate and cooperative throughout examination Chest Wall:  chest  wall tenderness tp palpation R AAL @   areolar line Lungs:  Normal respiratory effort, chest expands symmetrically. Lungs are clear to auscultation, no crackles or wheezes. Heart:  Normal rate and regular rhythm. S1 and S2 normal without gallop, murmur, click, rub or other extra sounds. Extremities:  No clubbing, cyanosis, edema. Neg Homan's Skin:  Intact without suspicious lesions or rashes. No bruising Cervical Nodes:  No lymphadenopathy noted Axillary Nodes:  No palpable lymphadenopathy Psych:  memory intact for recent and remote, normally interactive, and good eye contact.     Impression & Recommendations:  Problem # 1:  CHEST WALL PAIN, ACUTE (ICD-786.52)  Post fall; R/O fracture His updated medication list for this problem includes:    Bayer Aspirin 325 Mg Tabs (Aspirin) .Marland Kitchen... 1 by mouth once daily  Orders: T-2 View CXR (71020TC) T-Ribs Unilateral 2 Views (71100TC)  Complete Medication List: 1)  Proscar 5 Mg Tabs (Finasteride) .Marland Kitchen.. 1 by mouth once daily 2)  Zocor 20 Mg Tabs (Simvastatin) .... Take 1 tablet by mouth once a day 3)  Bayer Aspirin 325 Mg Tabs (Aspirin) .Marland Kitchen.. 1 by mouth once daily 4)  Tramadol Hcl 50 Mg Tabs (Tramadol hcl) .Marland Kitchen.. 1 every 6 hrs as needed pain  Patient Instructions: 1)  Take pain meds as needed for pain.  Prescriptions: TRAMADOL  HCL 50 MG TABS (TRAMADOL HCL) 1 every 6 hrs as needed pain  #30 x 0   Entered and Authorized by:   Marga Melnick MD   Signed by:   Marga Melnick MD on 01/26/2010   Method used:   Print then Give to Patient   RxID:   [id number]

## 2010-08-22 NOTE — Letter (Signed)
Summary: [facility]   Imported By: Freddy Jaksch 06/14/2007 13:19:20  _____________________________________________________________________  External Attachment:    Type:   Image     Comment:   External Document

## 2010-08-22 NOTE — Assessment & Plan Note (Signed)
Summary: B-12 SHOT///SPH  Nurse Visit    Prior Medications: MECLIZINE HCL 25 MG TABS (MECLIZINE HCL) 1/2-1 TAB PO EVERY 8 HOURS as needed FOR DIZZINESS PROSCAR 5 MG  TABS (FINASTERIDE) 1 by mouth once daily CLONAZEPAM 0.5 MG  TABS (CLONAZEPAM) TAKE AS DIRECTED AGGRENOX 25-200 MG  CP12 (ASPIRIN-DIPYRIDAMOLE) 1 bid LIPITOR 10 MG  TABS (ATORVASTATIN CALCIUM) 1 by mouth qd Current Allergies: ! PREDNISONE    Medication Administration  Injection # 1:    Medication: Vit B12 1000 mcg    Diagnosis: ANEMIA (ICD-285.9)    Patient tolerated injection without complications    Given by: Floydene Flock CMA (October 27, 2007 3:34 PM)  Orders Added: 1)  Vit B12 1000 mcg [J3420] 2)  Admin of Therapeutic Inj  intramuscular or subcutaneous [email or web address].[email or web address]    ]  Medication Administration  Injection # 1:    Medication: Vit B12 1000 mcg    Diagnosis: ANEMIA (ICD-285.9)    Patient tolerated injection without complications    Given by: Floydene Flock CMA (October 27, 2007 3:34 PM)  Orders Added: 1)  Vit B12 1000 mcg [J3420] 2)  Admin of Therapeutic Inj  intramuscular or subcutaneous [96372]  Appended Document: B-12 SHOT///SPH   Medication Administration  Injection # 1:    Medication: Vit B12 1000 mcg    Diagnosis: ANEMIA (ICD-285.9)    Patient tolerated injection without complications    Given by: Floydene Flock CMA (October 27, 2007 4:08 PM)  Orders Added: 1)  Vit B12 1000 mcg [J3420] 2)  Admin of Therapeutic Inj  intramuscular or subcutaneous [[id number]]

## 2010-08-22 NOTE — Assessment & Plan Note (Signed)
Summary: RECTAL BLEED & HX OF POLYPS/YF   History of Present Illness Visit Type: consult Primary GI MD: Melvia Heaps MD [facility] Primary Provider: Marga Melnick, MD Requesting Provider: Marga Melnick, MD Chief Complaint: rectal bleeding Justin [name] is a pleasant 77 year old white male referred at the request of Dr. Alwyn Ren for evaluation of rectal bleeding.  On one occasion she passed bright red blood per rectum consistent no blood in the toilet and coating the stools.  He has had no recurrences.  He does note some mild pain in the right upper quadrant which has been a minor problem.  Family history is pertinent for  colon cancer in a sister.  He has a history of diverticulosis and colon polyps.  Last colonoscopy in 2005 demonstrated diverticulosis only.   GI Review of Systems    Reports bloating.      Denies abdominal pain, acid reflux, belching, chest pain, dysphagia with liquids, dysphagia with solids, heartburn, loss of appetite, nausea, vomiting, vomiting blood, weight loss, and  weight gain.      Reports constipation, diverticulosis, hemorrhoids, and  rectal bleeding.     Denies anal fissure, black tarry stools, change in bowel habit, diarrhea, fecal incontinence, heme positive stool, irritable bowel syndrome, jaundice, light color stool, liver problems, and  rectal pain. Preventive Screening-Counseling & Management      Drug Use:  no.      Current Medications (verified): 1)  Proscar 5 Mg  Tabs (Finasteride) .Marland Kitchen.. 1 By Mouth Once Daily 2)  Aggrenox 25-200 Mg  Cp12 (Aspirin-Dipyridamole) .... Take 1 Tablet By Mouth Two Times A Day 3)  Zocor 20 Mg Tabs (Simvastatin) .... Take 1 Tablet By Mouth Once A Day 4)  Flexeril 10 Mg Tabs (Cyclobenzaprine Hcl) .Marland Kitchen.. 1 By Mouth Three Times A Day As Needed  Allergies (verified): 1)  ! Prednisone  Past History:  Past Medical History: Benign prostatic hypertrophy Hyperlipidemia Nephrolithiasis, hx of Transient ischemic attack, hx of; Dr  Pearlean Brownie Colonic polyps, hx of, Dr Victorino Dike Diverticulosis, colon Anemia Sleep Apnea  Past Surgical History: Reviewed history from 01/07/2009 and no changes required. Lithotripsy, Dr Logan Bores Hemorrhoidectomy Colon polypectomy, due 2010, Dr Victorino Dike  Family History: Family History of Colon Cancer: Mother Family History of Prostate Cancer: Brother Family History of Heart Disease: Father, MI Family History of Diabetes: Mother Family History of Colon Polyps: Mother  Social History: Married, 2 boys, 2 girls Retired Former Smoker Alcohol Use - no Daily Caffeine Use 3 cups Illicit Drug Use - no Drug Use:  no  Review of Systems       The patient complains of hearing problems, muscle pains/cramps, and sleeping problems.  The patient denies allergy/sinus, anemia, anxiety-new, arthritis/joint pain, back pain, blood in urine, breast changes/lumps, change in vision, confusion, cough, coughing up blood, depression-new, fainting, fatigue, fever, headaches-new, heart murmur, heart rhythm changes, itching, night sweats, nosebleeds, shortness of breath, skin rash, sore throat, swelling of feet/legs, swollen lymph glands, thirst - excessive, urination - excessive, urination changes/pain, urine leakage, vision changes, and voice change.         All other systems were reviewed and were negative   Vital Signs:  Patient profile:   77 year old male Height:      71 inches Weight:      186 pounds BMI:     26.04 Pulse rate:   76 / minute Pulse rhythm:   regular BP sitting:   110 / 60  (left arm) Cuff size:   regular  Vitals Entered By: Francee Piccolo CMA Duncan Dull) (February 02, 2009 1:48 PM)  Physical Exam  Additional Exam:  Is a healthy-appearing male  skin: anicteric HEENT: normocephalic; PEERLA; no nasal or pharyngeal abnormalities neck: supple nodes: no cervical lymphadenopathy chest: clear to ausculatation and percussion heart: no murmurs, gallops, or rubs abd: soft, nontender; BS  normoactive; no abdominal masses, tenderness, organomegaly rectal: deferred ext: no cynanosis, clubbing, edema skeletal: no deformities neuro: oriented x 3; no focal abnormalities   skin: anicteric HEENT: normocephalic; PEERLA; no nasal or pharyngeal abnormalities neck: supple nodes: no cervical lymphadenopathy chest: clear to ausculatation and percussion heart: no murmurs, gallops, or rubs abd: soft, nontender; BS normoactive; no abdominal masses, tenderness, organomegaly rectal: deferred ext: no cynanosis, clubbing, edema skeletal: no deformities neuro: oriented x 3; no focal abnormalities   Impression & Recommendations:  Problem # 1:  RECTAL BLEEDING (ICD-569.3)  Plan colonoscopy for evaluation of bleeding.  Possibilities include hemorrhoids, AVMs, polyps or neoplasm.  Orders: Colonoscopy (Colon)  Problem # 2:  COLONIC POLYPS, HX OF (ICD-V12.72) Assessment: Comment Only  Orders: Colonoscopy (Colon)  Problem # 3:  TRANSIENT ISCHEMIC ATTACK, HX OF (ICD-V12.50) Patient is on Aggrenox.  This will be held in anticipation of his colonoscopy  Problem # 4:  FAMILY HISTORY OF COLON CA 1ST DEGREE RELATIVE <60 (ICD-V16.0) Assessment: Comment Only  Patient Instructions: 1)  Colonoscopy and Flexible Sigmoidoscopy brochure given.  2)  Conscious Sedation brochure given.  3)  Your Colonoscopy is scheduled for 02/17/2009 at 3PM 4)  You can pick up your MoviPrep from your pharmacy today 5)  You will Hold Aggrenox prior to your procedure 6)  The medication list was reviewed and reconciled.  All changed / newly prescribed medications were explained.  A complete medication list was provided to the patient / caregiver. Prescriptions: MOVIPREP 100 GM  SOLR (PEG-KCL-NACL-NASULF-NA ASC-C) As per prep instructions.  #1 x 0   Entered by:   Merri Ray CMA (AAMA)   Authorized by:   Louis Meckel MD   Signed by:   Merri Ray CMA (AAMA) on 02/02/2009   Method used:    Electronically to        [facility]  [address] [phone] (retail)       [address]       [place], [place]  [address]       Ph: [phone] or [phone]       Fax: [phone]   RxID:   [id number]

## 2010-08-22 NOTE — Assessment & Plan Note (Signed)
Summary: b12//tl  Nurse Visit    Prior Medications: MECLIZINE HCL 25 MG TABS (MECLIZINE HCL) 1/2-1 TAB PO EVERY 8 HOURS as needed FOR DIZZINESS PROSCAR 5 MG  TABS (FINASTERIDE) 1 by mouth once daily CLONAZEPAM 0.5 MG  TABS (CLONAZEPAM) TAKE AS DIRECTED AGGRENOX 25-200 MG  CP12 (ASPIRIN-DIPYRIDAMOLE) 1 bid Current Allergies: ! PREDNISONE    Medication Administration  Injection # 1:    Medication: Vit B12 1000 mcg    Diagnosis: ANEMIA (ICD-285.9)    Patient tolerated injection without complications    Given by: Floydene Flock (June 30, 2007 2:28 PM)  Orders Added: 1)  Vit B12 1000 mcg [J3420] 2)  Admin of Therapeutic Inj  intramuscular or subcutaneous Quintilian.Boros    ]  Medication Administration  Injection # 1:    Medication: Vit B12 1000 mcg    Diagnosis: ANEMIA (ICD-285.9)    Patient tolerated injection without complications    Given by: Floydene Flock (June 30, 2007 2:28 PM)  Orders Added: 1)  Vit B12 1000 mcg [J3420] 2)  Admin of Therapeutic Inj  intramuscular or subcutaneous [[id number]]

## 2010-08-22 NOTE — Assessment & Plan Note (Signed)
Summary: b12//tl  Nurse Visit    Prior Medications: MECLIZINE HCL 25 MG TABS (MECLIZINE HCL) 1/2-1 TAB PO EVERY 8 HOURS as needed FOR DIZZINESS PROSCAR 5 MG  TABS (FINASTERIDE) 1 by mouth once daily CLONAZEPAM 0.5 MG  TABS (CLONAZEPAM) TAKE AS DIRECTED AGGRENOX 25-200 MG  CP12 (ASPIRIN-DIPYRIDAMOLE) 1 bid Current Allergies: ! PREDNISONE    Medication Administration  Injection # 1:    Medication: Vit B12 1000 mcg    Diagnosis: ANEMIA (ICD-285.9)    Route: IM    Site: L deltoid    Exp Date: 04/2009    Lot #: [id number]    Mfr: American Regent    Patient tolerated injection without complications    Given by: Shonna Chock (July 29, 2007 12:10 PM)    ]  Medication Administration  Injection # 1:    Medication: Vit B12 1000 mcg    Diagnosis: ANEMIA (ICD-285.9)    Route: IM    Site: L deltoid    Exp Date: 04/2009    Lot #: [id number]    Mfr: American Regent    Patient tolerated injection without complications    Given by: Shonna Chock (July 29, 2007 12:10 PM)

## 2010-08-22 NOTE — Progress Notes (Signed)
Summary: HAD TO STOP AGGRENOX FOR 7 DAYS/left msg to call 2X  Phone Note Call from Patient   Caller: Patient Summary of Call: PT WAS IN TODAY TO GET A B-12 SHOT AND HE STOPPED BY THE DESK AND TOLD ME TO TELL KATHY THAT HE HAD TO STOP THE AGGRENOX 25-200 MG FOR SEVEN DAYS BUT HE DIDNT TELL ME WHY--HE JUST SAID THAT AND LEFT.    PT Justin [name] [phone] Initial call taken by: Vanessa [place],  August 29, 2007 3:18 PM  Follow-up for Phone Call        He needs to discuss this with the Neurologist who Rxed it. That specialist should determine if safe to stop this for any procedures or other  reasons. Follow-up by: Marga Melnick MD,  August 30, 2007 7:55 AM  Additional Follow-up for Phone Call Additional follow up Details #1::        lkeft msg for pt to call re; Dr Caryl Never recommendations...................................................................Marland KitchenKandice Hams  September 01, 2007 12:22 PM  Additional Follow-up by: Kandice Hams,  September 01, 2007 12:22 PM    Additional Follow-up for Phone Call Additional follow up Details #2::    s/w pt informed of dr hoppers recommendations...................................................................Marland KitchenKandice Hams  September 02, 2007 9:18 AM  Follow-up by: Kandice Hams,  September 02, 2007 9:18 AM

## 2010-08-22 NOTE — Procedures (Signed)
Summary: EGD   EGD  Procedure date:  05/07/2002  Findings:      Findings: Duodenitis  Location: [facility]   Patient Name: Cadarius, Nevares. MRN:  Procedure Procedures: Panendoscopy (EGD) CPT: 43235.  Personnel: Endoscopist: Ulyess Mort, MD.  Referred By: Titus Dubin. Alwyn Ren, MD.  Exam Location: Exam performed in Outpatient Clinic. Outpatient  Patient Consent: Procedure, Alternatives, Risks and Benefits discussed, consent obtained, from patient. Consent was obtained by the RN.  Indications  Evaluation of: Anemia,   Symptoms: Melena.  History  Pre-Exam Physical: Performed May 07, 2002  Abdominal exam, Extremity exam, Mental status exam WNL.  Exam Exam Info: Maximum depth of insertion Duodenum, intended Duodenum. Vocal cords visualized. Gastric retroflexion performed. Images were not taken. ASA Classification: II. Tolerance: good.  Sedation Meds: Patient assessed and found to be appropriate for moderate (conscious) sedation. Fentanyl 50 mcg. given IV. Versed 5 mg. given IV. Cetacaine Spray 1 sprays given aerosolized.  Monitoring: BP and pulse monitoring done. Oximetry used. Supplemental O2 given  Findings - Normal: Distal Esophagus to Cardia.  - MUCOSAL ABNORMALITY: Duodenal Bulb to Duodenal 2nd Portion. Erythematous mucosa. Granular mucosa. ICD9: Duodenitis without Hemorrhage: 535.60. Comment: mild duodenitis.  - Normal: Fundus to Antrum. Not Seen: Tumor. Ulcer. Barrett's esophagus. Mallory-Weiss tear. AVM's. Polyp. Sprue. Stricture. Varices.   Assessment Abnormal examination, see findings above.  Diagnoses: 535.60: Duodenitis without Hemorrhage.   Events  Unplanned Intervention: No unplanned interventions were required.  Unplanned Events: There were no complications. Plans Medication(s): Continue current medications. H2Blocker: Axid/Nizatidine   Patient Education: Patient given standard instructions for: Mucosal Abnormality.  a normal exam. recheck coloscreens several weeks.  Disposition: After procedure patient sent to recovery. After recovery patient sent home.  This report was created from the original endoscopy report, which was reviewed and signed by the above listed endoscopist.    cc Chrissie Noa Hopper,MD

## 2010-08-22 NOTE — Assessment & Plan Note (Signed)
Summary: b-12.cbs   Nurse Visit    Prior Medications: MECLIZINE HCL 25 MG TABS (MECLIZINE HCL) 1/2-1 TAB PO EVERY 8 HOURS as needed FOR DIZZINESS PROSCAR 5 MG  TABS (FINASTERIDE) 1 by mouth once daily CLONAZEPAM 0.5 MG  TABS (CLONAZEPAM) TAKE AS DIRECTED AGGRENOX 25-200 MG  CP12 (ASPIRIN-DIPYRIDAMOLE) 1 bid LIPITOR 10 MG  TABS (ATORVASTATIN CALCIUM) 1 by mouth qd DOXYCYCLINE MONOHYDRATE 100 MG  CAPS (DOXYCYCLINE MONOHYDRATE) 1 two times a day X 5 days then 1 once daily HYCODAN 5-1.5 MG/5ML  SYRP (HYDROCODONE-HOMATROPINE) 1 tsp q 6 hrs prn Current Allergies: ! PREDNISONE    Medication Administration  Injection # 1:    Medication: Vit B12 1000 mcg    Diagnosis: ANEMIA (ICD-285.9)    Route: IM    Site: R deltoid    Exp Date: 11/2009    Lot #: [id number]    Mfr: American Regent    Patient tolerated injection without complications    Given by: Floydene Flock CMA (March 05, 2008 3:10 PM)  Orders Added: 1)  Admin of Therapeutic Inj  intramuscular or subcutaneous [96372] 2)  Vit B12 1000 mcg [email or web address].[email or web address]    ]  Medication Administration  Injection # 1:    Medication: Vit B12 1000 mcg    Diagnosis: ANEMIA (ICD-285.9)    Route: IM    Site: R deltoid    Exp Date: 11/2009    Lot #: [id number]    Mfr: American Regent    Patient tolerated injection without complications    Given by: Floydene Flock CMA (March 05, 2008 3:10 PM)  Orders Added: 1)  Admin of Therapeutic Inj  intramuscular or subcutaneous [96372] 2)  Vit B12 1000 mcg [J3420]

## 2010-08-22 NOTE — Assessment & Plan Note (Signed)
Summary: b 12 shot//ca   Nurse Visit        Medication Administration  Injection # 1:    Medication: Vit B12 1000 mcg    Diagnosis: ANEMIA (ICD-285.9)    Route: IM    Site: L deltoid    Exp Date: 07/23/2008    Lot #: [id number]    Mfr: american regent    Patient tolerated injection without complications    Given by: Floydene Flock (February 26, 2007 2:56 PM)  Orders Added: 1)  Vit B12 1000 mcg [J3420] 2)  Admin of Therapeutic Inj  intramuscular or subcutaneous [[id number]]

## 2010-08-22 NOTE — Assessment & Plan Note (Signed)
Summary: B-12/CBS   Nurse Visit     Allergies: 1)  ! Prednisone     Medication Administration  Injection # 1:    Medication: Vit B12 1000 mcg    Diagnosis: ANEMIA (ICD-285.9)    Patient tolerated injection without complications    Given by: Floydene Flock CMA (January 07, 2009 11:39 AM)  Orders Added: 1)  Admin of Therapeutic Inj  intramuscular or subcutaneous [96372] 2)  Vit B12 1000 mcg [J3420]      Medication Administration  Injection # 1:    Medication: Vit B12 1000 mcg    Diagnosis: ANEMIA (ICD-285.9)    Patient tolerated injection without complications    Given by: Floydene Flock CMA (January 07, 2009 11:39 AM)  Orders Added: 1)  Admin of Therapeutic Inj  intramuscular or subcutaneous [96372] 2)  Vit B12 1000 mcg [J3420]

## 2010-08-22 NOTE — Letter (Signed)
Summary: [facility]at[facility]  D[facility]atology   Imported By: Freddy Jaksch 10/13/2007 10:31:34  _____________________________________________________________________  External Attachment:    Type:   Image     Comment:   External Document

## 2010-08-22 NOTE — Assessment & Plan Note (Signed)
Summary: hand feels as if it wants to go to sleep.cbs   Vital Signs:  Patient profile:   77 year old male Weight:      186 pounds Temp:     98.1 degrees F oral Pulse rate:   78 / minute Resp:     16 per minute BP sitting:   128 / 64  (left arm)  Vitals Entered By: Ardyth Man (Nov 29, 2008 3:25 PM) Is Patient Diabetic? No Pain Assessment Patient in pain? no       Have you ever been in a relationship where you felt threatened, hurt or afraid?No   Does patient need assistance? Functional Status Self care Ambulation Normal   History of Present Illness: Pt here with wife c/o numbness in Left shoulder and arm for about 1 week.  No known injury.  No cp, no sob.    Current Medications (verified): 1)  Meclizine Hcl 25 Mg Tabs (Meclizine Hcl) .... 1/2-1 Tab Po Every 8 Hours As Needed For Dizziness 2)  Proscar 5 Mg  Tabs (Finasteride) .Marland Kitchen.. 1 By Mouth Once Daily 3)  Clonazepam 0.5 Mg  Tabs (Clonazepam) .... Take As Directed 4)  Aggrenox 25-200 Mg  Cp12 (Aspirin-Dipyridamole) .Marland Kitchen.. 1 Bid 5)  Lipitor 10 Mg  Tabs (Atorvastatin Calcium) .Marland Kitchen.. 1 By Mouth Qd 6)  Hydrocodone-Acetaminophen 5-500 Mg Tabs (Hydrocodone-Acetaminophen) .Marland Kitchen.. 1-2 Q6 -8 Hrs 7)  Valtrex 1 Gm Tabs (Valacyclovir Hcl) .Marland Kitchen.. 1 By Mouth Three Times A Day X 1 Week 8)  Flexeril 10 Mg Tabs (Cyclobenzaprine Hcl) .Marland Kitchen.. 1 By Mouth Three Times A Day As Needed  Allergies (verified): 1)  ! Prednisone  Past History:  Past medical, surgical, family and social histories (including risk factors) reviewed, and no changes noted (except as noted below).  Past Medical History:    Reviewed history from 05/10/2008 and no changes required:    Benign prostatic hypertrophy    Hyperlipidemia    Nephrolithiasis, hx of    Transient ischemic attack, hx of; Dr Pearlean Brownie    Colonic polyps, hx of    Diverticulosis, colon  Past Surgical History:    Reviewed history from 05/10/2008 and no changes required:    Lithotripsy, DrEvans  Hemorrhoidectomy    Colon polypectomy, due 2010 (prev Dr Victorino Dike; he'll see Dr Jarold Motto)  Family History:    Reviewed history from 04/02/2007 and no changes required:       Family History of Colon CA 1st degree relative <60       Family History Diabetes 1st degree relative       Family History Hypertension       Family History of Prostate CA 1st degree relative <50       F MI 46  Social History:    Reviewed history from 03/31/2007 and no changes required:       Former Smoker  Review of Systems      See HPI  Physical Exam  General:  Well-developed,well-nourished,in no acute distress; alert,appropriate and cooperative throughout examination Neck:  No deformities, masses, or tenderness noted. Lungs:  Normal respiratory effort, chest expands symmetrically. Lungs are clear to auscultation, no crackles or wheezes. Heart:  normal rate and regular rhythm.   Msk:  + muscle spasms L trap  with tendernessno joint swelling, no joint warmth, no redness over joints, no joint deformities, no joint instability, and no crepitation.   Pulses:  R and L carotid,radial,femoral,dorsalis pedis and posterior tibial pulses are full and equal bilaterally Extremities:  No  clubbing, cyanosis, edema, or deformity noted with normal full range of motion of all joints.   Neurologic:  alert & oriented X3 and strength normal in all extremities.   Skin:  Intact without suspicious lesions or rashes Cervical Nodes:  No lymphadenopathy noted Psych:  Oriented X3, normally interactive, and good eye contact.     Impression & Recommendations:  Problem # 1:  ARM NUMBNESS (ICD-782.0)  ? secondary to muscle spasm flexeril 10 mg three times a day as needed  warm compresses consider c spine w/u if no improvement  Orders: EKG w/ Interpretation (93000)  Complete Medication List: 1)  Meclizine Hcl 25 Mg Tabs (Meclizine hcl) .... 1/2-1 tab po every 8 hours as needed for dizziness 2)  Proscar 5 Mg Tabs (Finasteride)  .Marland Kitchen.. 1 by mouth once daily 3)  Clonazepam 0.5 Mg Tabs (Clonazepam) .... Take as directed 4)  Aggrenox 25-200 Mg Cp12 (Aspirin-dipyridamole) .Marland Kitchen.. 1 bid 5)  Lipitor 10 Mg Tabs (Atorvastatin calcium) .Marland Kitchen.. 1 by mouth qd 6)  Hydrocodone-acetaminophen 5-500 Mg Tabs (Hydrocodone-acetaminophen) .Marland Kitchen.. 1-2 q6 -8 hrs 7)  Valtrex 1 Gm Tabs (Valacyclovir hcl) .Marland Kitchen.. 1 by mouth three times a day x 1 week 8)  Flexeril 10 Mg Tabs (Cyclobenzaprine hcl) .Marland Kitchen.. 1 by mouth three times a day as needed Prescriptions: FLEXERIL 10 MG TABS (CYCLOBENZAPRINE HCL) 1 by mouth three times a day as needed  #30 x 0   Entered and Authorized by:   Loreen Freud DO   Signed by:   Loreen Freud DO on 11/29/2008   Method used:   Electronically to        [facility]  [address] [phone]* (retail)       [address] [place], [place]  [address]       Ph: [phone] or [phone]       Fax: [phone]   RxID:   [id number]

## 2010-08-22 NOTE — Letter (Signed)
Summary: Results Follow up Letter  [place] [facility]  [address] [place], [place] [address]   Phone: [phone]  Fax: [phone]    06/01/2008 MRN: [id number]  Justin [name] [address] [place], [place]  [address]  Dear [name],  The following are the results of your recent test(s):  Test         Result    Pap Smear:        Normal _____  Not Normal _____ Comments: ______________________________________________________ Cholesterol: LDL(Bad cholesterol):         Your goal is less than:         HDL (Good cholesterol):       Your goal is more than: Comments:  ______________________________________________________ Mammogram:        Normal _____  Not Normal _____ Comments:  ___________________________________________________________________ Hemoccult:        Normal _X____  Not normal _______ Comments:    _____________________________________________________________________ Other Tests:    We routinely do not discuss normal results over the telephone.  If you desire a copy of the results, or you have any questions about this information we can discuss them at your next office visit.   Sincerely,

## 2010-08-22 NOTE — Assessment & Plan Note (Signed)
Summary: b-12/cbs   Nurse Visit     Allergies: 1)  ! Prednisone     Medication Administration  Injection # 1:    Medication: Vit B12 1000 mcg    Diagnosis: ANEMIA (ICD-285.9)    Route: IM    Site: L deltoid    Exp Date: 08/2010    Lot #: [id number]    Mfr: American Regent    Patient tolerated injection without complications    Given by: Floydene Flock CMA (Dec 06, 2008 2:34 PM)  Orders Added: 1)  Admin of Therapeutic Inj  intramuscular or subcutaneous [96372] 2)  Vit B12 1000 mcg [J3420]      Medication Administration  Injection # 1:    Medication: Vit B12 1000 mcg    Diagnosis: ANEMIA (ICD-285.9)    Route: IM    Site: L deltoid    Exp Date: 08/2010    Lot #: [id number]    Mfr: American Regent    Patient tolerated injection without complications    Given by: Floydene Flock CMA (Dec 06, 2008 2:34 PM)  Orders Added: 1)  Admin of Therapeutic Inj  intramuscular or subcutaneous [96372] 2)  Vit B12 1000 mcg [J3420]

## 2010-08-22 NOTE — Progress Notes (Signed)
Summary: keep appt or wait till he has mri-dr hopper  Phone Note Call from Patient Call back at Home Phone [phone]   Caller: Spouse Summary of Call: patient was referred to urologist [phone] - (appt is [phone]) but he was still having pain he was referred for mri which isnt scheduled yet - should he wait until he has mri before seeing urologist or should he keep appt tomorrow -- ok to leave msg on vm Initial call taken by: Okey Regal [place],  May 19, 2008 9:01 AM  Follow-up for Phone Call        DR.HOPPER PLEASE ADVISE Follow-up by: Shonna Chock,  May 19, 2008 12:15 PM  Additional Follow-up for Phone Call Additional follow up Details #1::        keep appt 10/29 to reassess active picture Additional Follow-up by: Marga Melnick MD,  May 19, 2008 1:19 PM    Additional Follow-up for Phone Call Additional follow up Details #2::    Spoke with Mrs.Kovalenko, she was calling just to make sure that her husband is to follow-up with Dr.Evans tomorrow and would like to know what they will be doing tomorrow. Instructed to keep appointment for tomorrow and given number to call Dr.Evans office to ask additional questions. Follow-up by: Shonna Chock,  May 19, 2008 2:47 PM

## 2010-08-22 NOTE — Letter (Signed)
Summary: [facility]  [facility]   Imported By: Lanelle Bal 02/03/2010 09:48:49  _____________________________________________________________________  External Attachment:    Type:   Image     Comment:   External Document

## 2010-08-22 NOTE — Letter (Signed)
Summary: [facility]  [facility]   Imported By: Freddy Jaksch 09/02/2007 16:44:50  _____________________________________________________________________  External Attachment:    Type:   Image     Comment:   External Document

## 2010-08-22 NOTE — Progress Notes (Signed)
Summary: DR CHAMPION SAYS OK TO RETURN TO WORK--hop see please  Phone Note Call from Patient Call back at Home Phone [phone]   Caller: Patient Reason for Call: Talk to Doctor Summary of Call: DR Nevaya Nagele PATIENT  PATIENT CAME BY TODAY TO REPORT THAT DR CHAMPION HAD SAID THAT Justin [name] COULD RETURN TO WORK, BUT THAT DR Emmanuella Mirante WOULD HAVE TO SIGN PAPERWORK FOR EMPLOYER TO SHOW THAT Justin [name] WAS ELIGIBLE TO RETURN TO WORK--PT STATES THAT DR CHAMPION WILL BE FAXING RESULTS OF TESTS OVER TO DR Alvah Gilder IN THE NEXT FEW DAYS  WOULD LIKE TO COME BACK AND PICK UP A "RETURN TO WORK " FORM AS SOON AS POSSIBLE Initial call taken by: Jerolyn Shin,  May 27, 2007 2:39 PM  Follow-up for Phone Call        I'll be gald to sign return to work as son as I have Dr Fredda Hammed written OK Follow-up by: Marga Melnick MD,  May 27, 2007 5:49 PM  Additional Follow-up for Phone Call Additional follow up Details #1::        lmom for pt per dr Wynema Garoutte I will be glad to sign return to work as soon as I have Dr Nash Shearer written OK Additional Follow-up by: Kandice Hams,  May 28, 2007 10:56 AM

## 2010-08-22 NOTE — Progress Notes (Signed)
Summary: RECEIVED RECORDS FROM DR CHAMPEE'S OFFICE ??  Phone Note Call from Patient Call back at [facility] Phone [phone]   Caller: Patient Summary of Call: DR HOPPER PATIENT  PATIENT ASKED IF RECORDS FROM DR CHAMPEE'S OFFICE HAVE BEEN RECEIVED.  HE WANTS DR HOPPER TO SEE THEN AND THEN ADD THEM TO HIS RECORDS.  ASKS THAT WE CALL HIM IF THEY HAVE NOT BEEN RECEIVED--HE WILL GO OVER AND PICK THEM UP FROM DR [facility] OFFICE AND BRING THEM TO [place]   ([phone]) Initial call taken by: Jerolyn Shin,  June 30, 2007 3:07 PM  Follow-up for Phone Call        called and left message for patient that I have not received the records from dr champeys office and could he go get them Follow-up by: Wendall Stade,  July 01, 2007 7:37 AM

## 2010-08-22 NOTE — Letter (Signed)
Summary: Colonoscopy-Changed to Office Visit Letter  [facility]  [address] [place], [place] [address]   Phone: [phone]  Fax: [phone]      May 06, 2009 MRN: [id number]   Justin [name] [address] [place], [place]  [address]   Dear [name],   According to our records, it is time for you to schedule a Colonoscopy. However, after reviewing your medical record, I feel that an office visit would be most appropriate to more completely evaluate you and determine your need for a repeat procedure.  Please call [phone] (option #2) at your convenience to schedule an office visit. If you have any questions, concerns, or feel that this letter is in error, we would appreciate your call.   Sincerely,  Barbette Hair. Arlyce Dice, M.D.  [name] [phone]

## 2010-08-22 NOTE — Assessment & Plan Note (Signed)
Summary: B12 SHOT/KDC   Nurse Visit   Allergies: 1)  ! Prednisone  Medication Administration  Injection # 1:    Medication: Allergy Injection (1)    Diagnosis: ANEMIA (ICD-285.9)    Route: IM    Site: R deltoid    Exp Date: 12/2010    Lot #: [id number]    Mfr: American Regent    Patient tolerated injection without complications    Given by: Rene Kocher Hoops (April 15, 2009 11:29 AM)  Orders Added: 1)  Admin of Therapeutic Inj  intramuscular or subcutaneous [96372] 2)  Allergy Injection (1) [95115]   Medication Administration  Injection # 1:    Medication: Allergy Injection (1)    Diagnosis: ANEMIA (ICD-285.9)    Route: IM    Site: R deltoid    Exp Date: 12/2010    Lot #: [id number]    Mfr: American Regent    Patient tolerated injection without complications    Given by: Rene Kocher Hoops (April 15, 2009 11:29 AM)  Orders Added: 1)  Admin of Therapeutic Inj  intramuscular or subcutaneous [96372] 2)  Allergy Injection (1) [16109]

## 2010-08-22 NOTE — Progress Notes (Signed)
Summary: QUESTION ABOUT FLU SHOT AND MRI  Phone Note Call from Patient Call back at Home Phone [phone]   Caller: Patient Summary of Call: ************PATIENT IS IN  LOBBY**********  WITH NUMEROUS QUESTIONS  STATES THAT HE IS HAVING THE LIPOTRIPSY PROCEDURE THIS WEEKON THURSDAY, 11/5 AND IS SCHEDULED FOR FLU CLINIC ON WED, 11/4----DOES DR HOPPER SAY IT IS OK FOR HIM TO GET FLU SHOT THIS WEEK????      IF OK, PLEASE SEND HIM TO LAB TO GET HIS SHOT TODAY  (APPOINTMENT WILL NEED TO BE SCHEDULED)  HE AND WIFE TALKED TO PEOPLE AT MRI PLACE ----THEY SUGGESTED THAT THE PATIENT WAIT TO GET THE MRI AFTER THE LIPOTRIPSY PROCEDURE AFTER THURSDAY AND THE PATIENT WANTED DR HOPPER TO KNOW THIS INFO AND MAKE SURE THAT HE SAYS IT IS OK FOR PATIENT TO HOLD OFF ON THE MRI Initial call taken by: Jerolyn Shin,  May 24, 2008 1:14 PM  Follow-up for Phone Call        pt aware it is ok to have flu shot per dr hopper Follow-up by: Jeremy Johann CMA,  May 24, 2008 1:47 PM

## 2010-08-22 NOTE — Assessment & Plan Note (Signed)
Summary: b-12/cbs   Nurse Visit       Medication Administration  Injection # 1:    Medication: Vit B12 1000 mcg    Diagnosis: ANEMIA (ICD-285.9)    Route: IM    Site: R deltoid    Exp Date: 06/22/2008    Lot #: [id number]    Mfr: AMERICAN REGENT  Orders Added: 1)  Vit B12 1000 mcg [J3420] 2)  Admin of Therapeutic Inj  intramuscular or subcutaneous [90772]

## 2010-08-22 NOTE — Assessment & Plan Note (Signed)
Summary: b-12 shot//ph  Nurse Visit    Prior Medications: MECLIZINE HCL 25 MG TABS (MECLIZINE HCL) 1/2-1 TAB PO EVERY 8 HOURS as needed FOR DIZZINESS PROSCAR 5 MG  TABS (FINASTERIDE) 1 by mouth once daily CLONAZEPAM 0.5 MG  TABS (CLONAZEPAM) TAKE AS DIRECTED AGGRENOX 25-200 MG  CP12 (ASPIRIN-DIPYRIDAMOLE) 1 bid Current Allergies: ! PREDNISONE    Medication Administration  Injection # 1:    Medication: Vit B12 1000 mcg    Diagnosis: ANEMIA (ICD-285.9)    Route: IM    Site: L deltoid    Exp Date: 10/10    Lot #: [id number]    Mfr: American Regent  Orders Added: 1)  Vit B12 1000 mcg [J3420] 2)  Admin of Therapeutic Inj  intramuscular or subcutaneous Lepidus.Putnam    ]  Medication Administration  Injection # 1:    Medication: Vit B12 1000 mcg    Diagnosis: ANEMIA (ICD-285.9)    Route: IM    Site: L deltoid    Exp Date: 10/10    Lot #: [id number]    Mfr: American Regent  Orders Added: 1)  Vit B12 1000 mcg [J3420] 2)  Admin of Therapeutic Inj  intramuscular or subcutaneous [86578]

## 2010-08-22 NOTE — Assessment & Plan Note (Signed)
Summary: muscle pain-rib pain--acute only--tl   Vital Signs:  Patient Profile:   77 Years Old Male Weight:      192 pounds Pulse rate:   64 / minute Pulse rhythm:   regular Resp:     17 per minute BP sitting:   110 / 60  (left arm) Cuff size:   large  Pt. in pain?   no  Vitals Entered By: Wendall Stade (September 24, 2007 11:34 AM)                  PCP:  [facility]  Chief Complaint:  several issues and Rash.  History of Present Illness: 1. 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 Pain has persisted with change in position. Patient is going to [facility] so Workman's Comp will pay per his boss. 2.  He also  has a rash on back  3. He   has  Ophth surgery coming up for ptosis; he requested  that he  come off Aggrenox seven days before surgery.  The Ophth states ptosis is causing decreased peripheral vision.    He was informed by his Neurologist, Dr Nash Shearer, that doing that could be dangerous to his health. "He stated he was coming off the Aggrenox @ your own risk (of having stroke)" She has recommended waiting  3 months & reassessing. He decreased Lipitor due to concerns re liver isues @ recommendation of his daughter. Dr Nash Shearer convinced him to restart.  Rash      This is an 77 year old man who presents with Rash.  Rash @ neck 1-2 months as plaque like lesions; using left over salve from Dr Lonni Fix with benefit.  The patient reports itching, but denies macules, papules, nodules, hives, welts, pustules, blisters, ulcers, scaling, weeping, oozing, redness, increased warmth, and tenderness.  The rash is located on the neck.  The patient denies the following symptoms: fever, headache, facial swelling, tongue swelling, burning, difficulty breathing, abdominal pain, nausea, vomiting, diarrhea, dizziness, sore throat, dysuria, eye symptoms, and arthralgias.  The patient denies  history of recent tick bite, recent tick exposure, other insect bite, recent infection, recent antibiotic use, new medication, new clothing, new topical exposure, recent travel, pet/animal contact, thyroid disease, chronic liver disease, autoimmune disease, chronic edema, and prior STD.      Current Allergies (reviewed today): ! PREDNISONE      Physical Exam  General:     Well-developed,well-nourished,in no acute distress; alert,appropriate and cooperative throughout examination Eyes:     Ptosis bilat. FOV grossly intact. Chest Wall:     Tenderness LLSB to palpation Lungs:     Normal respiratory effort, chest expands symmetrically. Lungs are clear to auscultation, no crackles or wheezes. Heart:     Normal rate and regular rhythm. S1 and S2 normal without gallop, murmur, click, rub . S4 with slurring. Skin:     Faint plaque like rash upper mid back below neck Cervical Nodes:     No lymphadenopathy noted Axillary Nodes:     No palpable lymphadenopathy Psych:     Oriented X3, memory intact for recent and remote, normally interactive, good eye contact, not anxious appearing, and not depressed appearing.  See comments  re Lipitor & Aggrenox.    Impression & Recommendations:  Problem # 1:  COSTOCHONDRITIS (ICD-733.6)  Problem #  2:  PTOSIS (ICD-374.30)  Problem # 3:  RASH-NONVESICULAR (ICD-782.1)  Orders: Dermatology Referral (Derma)   Problem # 4:  TIA (ICD-435.9)  His updated medication list for this problem includes:    Aggrenox 25-200 Mg Cp12 (Aspirin-dipyridamole) .Marland Kitchen... 1 bid   Complete Medication List: 1)  Meclizine Hcl 25 Mg Tabs (Meclizine hcl) .... 1/2-1 tab po every 8 hours as needed for dizziness 2)  Proscar 5 Mg Tabs (Finasteride) .Marland Kitchen.. 1 by mouth once daily 3)  Clonazepam 0.5 Mg Tabs (Clonazepam) .... Take as directed 4)  Aggrenox 25-200 Mg Cp12 (Aspirin-dipyridamole) .Marland Kitchen.. 1 bid 5)  Lipitor 10 Mg Tabs (Atorvastatin calcium) .Marland Kitchen.. 1 by mouth qd   Patient  Instructions: 1)  Glucosamine sulfate 1500 mg & heating pad as needed for costochondritis. Discuss stopping Aggrenox before surgery  with Dr Nash Shearer.    ]

## 2010-08-22 NOTE — Progress Notes (Signed)
Summary: DOT physical  Phone Note Call from Patient   Summary of Call: Patient called to discuss DOT physical. Patient was wondering if cpx from earlier in year could be used and if he could come in and have his eyes, ears, and urine checked without charge. Patient is also aware that he is due for yearly eye exam with eye MD and declined having it done so the eye MD could fill out the vision portion of the form. Patient was made aware that he will need appt for DOT physical and he declined stating that he would go have it done at urgent care instead. Initial call taken by: Lucious Groves CMA,  May 25, 2010 11:03 AM  Follow-up for Phone Call        That is appropriate. The Medicare Wellness Visit does not address DOT requirements( especially in an 77 yo individual) Follow-up by: Marga Melnick MD,  May 26, 2010 5:34 AM

## 2010-08-22 NOTE — Progress Notes (Signed)
Summary: ? re prep   Phone Note Call from Patient Call back at Home Phone [phone]   Caller: Patient Call For: Arlyce Dice Reason for Call: Talk to Nurse Summary of Call: Patient wants to know if theres a genetic fo Movi prep Initial call taken by: Tawni Levy,  February 15, 2009 3:23 PM  Follow-up for Phone Call        Tried to return pts call x 2 the line is busy Follow-up by: Merri Ray CMA Duncan Dull),  February 15, 2009 3:43 PM  Additional Follow-up for Phone Call Additional follow up Details #1::        Called pt back to inform that there is no generic movi-prep. Pt stated that the med was expensive but he would get it anyway. Told pt to call as needed  Additional Follow-up by: Merri Ray CMA (AAMA),  February 15, 2009 4:08 PM

## 2010-08-22 NOTE — Letter (Signed)
[facility]: [facility]   Imported By: Freddy Jaksch 08/26/2007 11:12:07  _____________________________________________________________________  External Attachment:    Type:   Image     Comment:   External Document

## 2010-08-22 NOTE — Assessment & Plan Note (Signed)
Summary: flu shot/cbs   Nurse Visit    Prior Medications: MECLIZINE HCL 25 MG TABS (MECLIZINE HCL) 1/2-1 TAB PO EVERY 8 HOURS as needed FOR DIZZINESS PROSCAR 5 MG  TABS (FINASTERIDE) 1 by mouth once daily CLONAZEPAM 0.5 MG  TABS (CLONAZEPAM) TAKE AS DIRECTED AGGRENOX 25-200 MG  CP12 (ASPIRIN-DIPYRIDAMOLE) 1 bid LIPITOR 10 MG  TABS (ATORVASTATIN CALCIUM) 1 by mouth qd HYDROCODONE-ACETAMINOPHEN 5-500 MG TABS (HYDROCODONE-ACETAMINOPHEN) 1-2 q6 -8 hrs VALTREX 1 GM TABS (VALACYCLOVIR HCL) 1 by mouth three times a day x 1 week Current Allergies: ! PREDNISONE  Flu Vaccine Consent Questions     Do you have a history of severe allergic reactions to this vaccine? no    Any prior history of allergic reactions to egg and/or gelatin? no    Do you have a sensitivity to the preservative Thimersol? no    Do you have a past history of Guillan-Barre Syndrome? no    Do you currently have an acute febrile illness? no    Have you ever had a severe reaction to latex? no    Vaccine information given and explained to patient? yes    Are you currently pregnant? no    Lot Number:[id number]   Exp Date:01/19/2009   Site Given  Left Deltoid IM   Orders Added: 1)  Flu Vaccine 64yrs + [78295] 2)  Administration Flu vaccine [G0008]    ]

## 2010-08-22 NOTE — Assessment & Plan Note (Signed)
Summary: B-12 SHOT///SPH   Nurse Visit   Allergies: 1)  ! Prednisone  Medication Administration  Injection # 1:    Medication: Vit B12 1000 mcg    Diagnosis: ANEMIA (ICD-285.9)    Route: IM    Site: L deltoid    Exp Date: 09/2011    Lot #: [id number]    Mfr: American Regent    Patient tolerated injection without complications    Given by: Shonna Chock CMA (April 24, 2010 9:58 AM)  Orders Added: 1)  Vit B12 1000 mcg [J3420] 2)  Admin of Therapeutic Inj  intramuscular or subcutaneous [16109]

## 2010-08-22 NOTE — Assessment & Plan Note (Signed)
Summary: b12 inj//lch   Nurse Visit   Allergies: 1)  ! Prednisone  Medication Administration  Injection # 1:    Medication: Vit B12 1000 mcg    Diagnosis: ANEMIA (ICD-285.9)    Route: IM    Site: L deltoid    Exp Date: 10/2011    Lot #: [id number]    Mfr: American Regent    Patient tolerated injection without complications    Given by: Shonna Chock (Dec 20, 2009 3:08 PM)  Orders Added: 1)  Vit B12 1000 mcg [J3420] 2)  Admin of Therapeutic Inj  intramuscular or subcutaneous [21308]

## 2010-08-22 NOTE — Assessment & Plan Note (Signed)
Summary: FLU SHOT AND B-12 SHOT///SPH  Nurse Visit    Prior Medications: MECLIZINE HCL 25 MG TABS (MECLIZINE HCL) 1/2-1 TAB PO EVERY 8 HOURS as needed FOR DIZZINESS PROSCAR 5 MG  TABS (FINASTERIDE) 1 by mouth once daily CLONAZEPAM 0.5 MG  TABS (CLONAZEPAM) TAKE AS DIRECTED AGGRENOX 25-200 MG  CP12 (ASPIRIN-DIPYRIDAMOLE) 1 bid Current Allergies: ! PREDNISONE   Influenza Vaccine    Vaccine Type: Fluvax MCR    Site: right deltoid    Mfr: Sanofi Pasteur    Dose: 0.5 ml    Route: IM    Given by: [employer]    Exp. Date: 01/20/2008    Lot #: [id number]  Flu Vaccine Consent Questions    Do you have a history of severe allergic reactions to this vaccine? no    Any prior history of allergic reactions to egg and/or gelatin? no    Do you have a sensitivity to the preservative Thimersol? no    Do you have a past history of Guillan-Barre Syndrome? no    Do you currently have an acute febrile illness? no    Have you ever had a severe reaction to latex? no    Vaccine information given and explained to patient? yes   Medication Administration  Injection # 1:    Medication: Vit B12 1000 mcg    Diagnosis: ANEMIA (ICD-285.9)    Route: IM    Site: L deltoid    Exp Date: 07/23/2008    Lot #: [id number]    Mfr: American Regent    Patient tolerated injection without complications    Given by: Floydene Flock (May 27, 2007 1:31 PM)  Orders Added: 1)  Vit B12 1000 mcg [J3420] 2)  Admin of Therapeutic Inj  intramuscular or subcutaneous [90772] 3)  Influenza Vaccine MCR [00025]    ]  Medication Administration  Injection # 1:    Medication: Vit B12 1000 mcg    Diagnosis: ANEMIA (ICD-285.9)    Route: IM    Site: L deltoid    Exp Date: 07/23/2008    Lot #: [id number]    Mfr: American Regent    Patient tolerated injection without complications    Given by: Floydene Flock (May 27, 2007 1:31 PM)  Orders Added: 1)  Vit B12 1000 mcg [J3420] 2)  Admin of Therapeutic Inj  intramuscular or  subcutaneous [90772] 3)  Influenza Vaccine MCR [00025]

## 2010-08-22 NOTE — Assessment & Plan Note (Signed)
Summary: B12 INJ/RH......   Nurse Visit   Allergies: 1)  ! Prednisone  Medication Administration  Injection # 1:    Medication: Vit B12 1000 mcg    Diagnosis: ANEMIA (ICD-285.9)    Route: IM    Site: L deltoid    Exp Date: 03/2011    Lot #: [id number]    Mfr: American Regent    Patient tolerated injection without complications    Given by: Floydene Flock (July 12, 2009 2:58 PM)  Orders Added: 1)  Admin of Therapeutic Inj  intramuscular or subcutaneous [96372] 2)  Vit B12 1000 mcg [J3420]   Medication Administration  Injection # 1:    Medication: Vit B12 1000 mcg    Diagnosis: ANEMIA (ICD-285.9)    Route: IM    Site: L deltoid    Exp Date: 03/2011    Lot #: [id number]    Mfr: American Regent    Patient tolerated injection without complications    Given by: Floydene Flock (July 12, 2009 2:58 PM)  Orders Added: 1)  Admin of Therapeutic Inj  intramuscular or subcutaneous [96372] 2)  Vit B12 1000 mcg [J3420]

## 2010-08-22 NOTE — Assessment & Plan Note (Signed)
Summary: b-12.cbs   Nurse Visit    Prior Medications: MECLIZINE HCL 25 MG TABS (MECLIZINE HCL) 1/2-1 TAB PO EVERY 8 HOURS as needed FOR DIZZINESS PROSCAR 5 MG  TABS (FINASTERIDE) 1 by mouth once daily CLONAZEPAM 0.5 MG  TABS (CLONAZEPAM) TAKE AS DIRECTED AGGRENOX 25-200 MG  CP12 (ASPIRIN-DIPYRIDAMOLE) 1 bid LIPITOR 10 MG  TABS (ATORVASTATIN CALCIUM) 1 by mouth qd HYDROCODONE-ACETAMINOPHEN 5-500 MG TABS (HYDROCODONE-ACETAMINOPHEN) 1-2 q6 -8 hrs VALTREX 1 GM TABS (VALACYCLOVIR HCL) 1 by mouth three times a day x 1 week Current Allergies: ! PREDNISONE    Medication Administration  Injection # 1:    Medication: Vit B12 1000 mcg    Diagnosis: ANEMIA (ICD-285.9)    Route: IM    Site: R deltoid    Exp Date: 03/2010    Lot #: [id number]    Mfr: [employer] Regent    Patient tolerated injection without complications    Given by: Floydene Flock CMA (July 05, 2008 1:20 PM)  Orders Added: 1)  Admin of Therapeutic Inj  intramuscular or subcutaneous [96372] 2)  Vit B12 1000 mcg [email or web address].[email or web address]    ]  Medication Administration  Injection # 1:    Medication: Vit B12 1000 mcg    Diagnosis: ANEMIA (ICD-285.9)    Route: IM    Site: R deltoid    Exp Date: 03/2010    Lot #: [id number]    Mfr: [employer] Regent    Patient tolerated injection without complications    Given by: Floydene Flock CMA (July 05, 2008 1:20 PM)  Orders Added: 1)  Admin of Therapeutic Inj  intramuscular or subcutaneous [96372] 2)  Vit B12 1000 mcg [J3420]

## 2010-08-22 NOTE — Assessment & Plan Note (Signed)
Summary: b-12//ph   Nurse Visit    Prior Medications: MECLIZINE HCL 25 MG TABS (MECLIZINE HCL) 1/2-1 TAB PO EVERY 8 HOURS as needed FOR DIZZINESS PROSCAR 5 MG  TABS (FINASTERIDE) 1 by mouth once daily CLONAZEPAM 0.5 MG  TABS (CLONAZEPAM) TAKE AS DIRECTED AGGRENOX 25-200 MG  CP12 (ASPIRIN-DIPYRIDAMOLE) 1 bid LIPITOR 10 MG  TABS (ATORVASTATIN CALCIUM) 1 by mouth qd HYDROCODONE-ACETAMINOPHEN 5-500 MG TABS (HYDROCODONE-ACETAMINOPHEN) 1-2 q6 -8 hrs VALTREX 1 GM TABS (VALACYCLOVIR HCL) 1 by mouth three times a day x 1 week Current Allergies: ! PREDNISONE    Medication Administration  Injection # 1:    Medication: Vit B12 1000 mcg    Diagnosis: ANEMIA (ICD-285.9)    Route: IM    Site: L deltoid    Exp Date: 02/21/2009    Lot #: [id number]    Mfr: [employer] Regent    Patient tolerated injection without complications    Given by: Floydene Flock CMA (August 06, 2008 2:57 PM)  Orders Added: 1)  Admin of Therapeutic Inj  intramuscular or subcutaneous [96372] 2)  Vit B12 1000 mcg [email or web address].[email or web address]    ]  Medication Administration  Injection # 1:    Medication: Vit B12 1000 mcg    Diagnosis: ANEMIA (ICD-285.9)    Route: IM    Site: L deltoid    Exp Date: 02/21/2009    Lot #: [id number]    Mfr: [employer] Regent    Patient tolerated injection without complications    Given by: Floydene Flock CMA (August 06, 2008 2:57 PM)  Orders Added: 1)  Admin of Therapeutic Inj  intramuscular or subcutaneous [96372] 2)  Vit B12 1000 mcg [J3420]

## 2010-08-22 NOTE — Assessment & Plan Note (Signed)
Summary: b12 shot//tl  Nurse Visit    Prior Medications: MECLIZINE HCL 25 MG TABS (MECLIZINE HCL) 1/2-1 TAB PO EVERY 8 HOURS as needed FOR DIZZINESS PROSCAR 5 MG  TABS (FINASTERIDE) 1 by mouth once daily CLONAZEPAM 0.5 MG  TABS (CLONAZEPAM) TAKE AS DIRECTED AGGRENOX 25-200 MG  CP12 (ASPIRIN-DIPYRIDAMOLE) 1 bid Current Allergies: ! PREDNISONE    Medication Administration  Injection # 1:    Medication: Vit B12 1000 mcg    Diagnosis: ANEMIA (ICD-285.9)    Route: IM    Site: R deltoid    Exp Date: 04/22/2009    Lot #: [id number]    Mfr: american regent    Patient tolerated injection without complications    Given by: Floydene Flock (April 28, 2007 1:20 PM)  Orders Added: 1)  Admin of Therapeutic Inj  intramuscular or subcutaneous [90772] 2)  Vit B12 1000 mcg [email or web address].Fontana    ]  Medication Administration  Injection # 1:    Medication: Vit B12 1000 mcg    Diagnosis: ANEMIA (ICD-285.9)    Route: IM    Site: R deltoid    Exp Date: 04/22/2009    Lot #: [id number]    Mfr: american regent    Patient tolerated injection without complications    Given by: Floydene Flock (April 28, 2007 1:20 PM)  Orders Added: 1)  Admin of Therapeutic Inj  intramuscular or subcutaneous [90772] 2)  Vit B12 1000 mcg [J3420]

## 2010-08-22 NOTE — Assessment & Plan Note (Signed)
Summary: B12 SHOT/KDC   Nurse Visit   Allergies: 1)  ! Prednisone  Medication Administration  Injection # 1:    Medication: Vit B12 1000 mcg    Diagnosis: ANEMIA (ICD-285.9)    Route: IM    Site: L deltoid    Exp Date: 02/2010    Lot #: [id number]    Mfr: [employer] Regent    Patient tolerated injection without complications    Given by: Shonna Chock (October 17, 2009 1:44 PM)  Orders Added: 1)  Vit B12 1000 mcg [J3420] 2)  Admin of Therapeutic Inj  intramuscular or subcutaneous [11914]

## 2010-08-22 NOTE — Letter (Signed)
Summary: LETTER FROM PT  LETTER FROM PT   Imported By: Freddy Jaksch 06/05/2007 08:52:47  _____________________________________________________________________  External Attachment:    Type:   Image     Comment:   External Document

## 2010-08-22 NOTE — Assessment & Plan Note (Signed)
Summary: b-12/cbs   Nurse Visit    Prior Medications: MECLIZINE HCL 25 MG TABS (MECLIZINE HCL) 1/2-1 TAB PO EVERY 8 HOURS as needed FOR DIZZINESS PROSCAR 5 MG  TABS (FINASTERIDE) 1 by mouth once daily CLONAZEPAM 0.5 MG  TABS (CLONAZEPAM) TAKE AS DIRECTED AGGRENOX 25-200 MG  CP12 (ASPIRIN-DIPYRIDAMOLE) 1 bid LIPITOR 10 MG  TABS (ATORVASTATIN CALCIUM) 1 by mouth qd Current Allergies: ! PREDNISONE    Medication Administration  Injection # 1:    Medication: Vit B12 1000 mcg    Diagnosis: ANEMIA (ICD-285.9)    Route: IM    Site: L deltoid    Exp Date: 08/2009    Lot #: [id number]    Mfr: [employer] Regent    Patient tolerated injection without complications    Given by: Floydene Flock CMA (Nov 24, 2007 1:39 PM)  Orders Added: 1)  Vit B12 1000 mcg [J3420] 2)  Admin of Therapeutic Inj  intramuscular or subcutaneous [email or web address].Putnam    ]  Medication Administration  Injection # 1:    Medication: Vit B12 1000 mcg    Diagnosis: ANEMIA (ICD-285.9)    Route: IM    Site: L deltoid    Exp Date: 08/2009    Lot #: [id number]    Mfr: [employer] Regent    Patient tolerated injection without complications    Given by: Floydene Flock CMA (Nov 24, 2007 1:39 PM)  Orders Added: 1)  Vit B12 1000 mcg [J3420] 2)  Admin of Therapeutic Inj  intramuscular or subcutaneous [[address]]

## 2010-08-22 NOTE — Assessment & Plan Note (Signed)
Summary: PROTIME/NTA   Nurse Visit   Allergies: 1)  ! Prednisone  Medication Administration  Injection # 1:    Medication: Vit B12 1000 mcg    Diagnosis: ANEMIA (ICD-285.9)    Route: IM    Site: L deltoid    Exp Date: 02/2011    Lot #: [id number]    Mfr: [employer] Regent    Patient tolerated injection without complications    Given by: Floydene Flock (May 16, 2009 1:44 PM)  Orders Added: 1)  Admin of Therapeutic Inj  intramuscular or subcutaneous [96372] 2)  Vit B12 1000 mcg [J3420]   Medication Administration  Injection # 1:    Medication: Vit B12 1000 mcg    Diagnosis: ANEMIA (ICD-285.9)    Route: IM    Site: L deltoid    Exp Date: 02/2011    Lot #: [id number]    Mfr: [employer] Regent    Patient tolerated injection without complications    Given by: Floydene Flock (May 16, 2009 1:44 PM)  Orders Added: 1)  Admin of Therapeutic Inj  intramuscular or subcutaneous [96372] 2)  Vit B12 1000 mcg [J3420]

## 2010-08-22 NOTE — Assessment & Plan Note (Signed)
Summary: b-12 shot and flu shot///sph   Nurse Visit  CC: B-12 and flu shot./kb   Allergies: 1)  ! Prednisone  Medication Administration  Injection # 1:    Medication: Vit B12 1000 mcg    Diagnosis: ANEMIA (ICD-285.9)    Route: IM    Site: R deltoid    Exp Date: 09/21/2011    Lot #: [id number]    Mfr: American Regent    Patient tolerated injection without complications    Given by: Lucious Groves CMA (May 23, 2010 11:17 AM)  Orders Added: 1)  Flu Vaccine 37yrs + MEDICARE PATIENTS [Q2039] 2)  Administration Flu vaccine - MCR [G0008] 3)  Vit B12 1000 mcg [J3420] 4)  Admin of Therapeutic Inj  intramuscular or subcutaneous [96372]           Flu Vaccine Consent Questions     Do you have a history of severe allergic reactions to this vaccine? no    Any prior history of allergic reactions to egg and/or gelatin? no    Do you have a sensitivity to the preservative Thimersol? no    Do you have a past history of Guillan-Barre Syndrome? no    Do you currently have an acute febrile illness? no    Have you ever had a severe reaction to latex? no    Vaccine information given and explained to patient? yes    Are you currently pregnant? no    Lot Number:[id number]   Exp Date:01/20/2011   Site Given  Right Deltoid IM

## 2010-08-22 NOTE — Assessment & Plan Note (Signed)
Summary: acute only  left side pain/cbs   Vital Signs:  Patient Profile:   77 Years Old Male Weight:      184 pounds Temp:     98.6 degrees F oral Pulse rate:   82 / minute Pulse rhythm:   regular BP sitting:   120 / 64  (left arm) Cuff size:   large  Vitals Entered By: Shary Decamp (May 14, 2008 11:26 AM)                 PCP:  [facility]  Chief Complaint:  still having pain; pain is in left hip (burning sensation); no rash; pt saw urologist this week -- they did CT pt does have kidney stones but they did not detect any movement - Dr. Logan Bores put pt on Levaquin 500 qd x 10;  pain is constant -- worse in the afternoon.  History of Present Illness: still having pain since 05-08-08 pain is in left hip area , burning sensation "like a torch" , no rash; pt saw urologist this week no worse w/  walking , worse at 4pm (reason unclear) pain is constant does have kidney stones but they did not detect any movement - Dr. Logan Bores put patient on Levaquin 500 qd x 10    Updated Prior Medication List: MECLIZINE HCL 25 MG TABS (MECLIZINE HCL) 1/2-1 TAB PO EVERY 8 HOURS as needed FOR DIZZINESS PROSCAR 5 MG  TABS (FINASTERIDE) 1 by mouth once daily CLONAZEPAM 0.5 MG  TABS (CLONAZEPAM) TAKE AS DIRECTED AGGRENOX 25-200 MG  CP12 (ASPIRIN-DIPYRIDAMOLE) 1 bid LIPITOR 10 MG  TABS (ATORVASTATIN CALCIUM) 1 by mouth qd HYDROCODONE-ACETAMINOPHEN 5-500 MG TABS (HYDROCODONE-ACETAMINOPHEN) 1-2 q6 -8 hrs  Current Allergies (reviewed today): ! PREDNISONE  Past Medical History:    Reviewed history from 05/10/2008 and no changes required:       Benign prostatic hypertrophy       Hyperlipidemia       Nephrolithiasis, hx of       Transient ischemic attack, hx of; Dr Pearlean Brownie       Colonic polyps, hx of       Diverticulosis, colon     Review of Systems       no fever, no rash denies back pain per se, no injury   Physical Exam  General:     alert and well-developed.   Abdomen:     soft and  non-tender.   Extremities:     no pretibial edema bilaterally  ROM hips normal and full, no pain (+) tenderness at L throcanteric area ("right were the pain is" per patient) Skin:     no rash at the back, abdomen, flank    Impression & Recommendations:  Problem # 1:  FLANK PAIN, LEFT (ICD-789.09) suspect pain is actually  related to throchanteric bursitis this dx was d/w patient   (this still could be shingles, see instructions )  His updated medication list for this problem includes:    Hydrocodone-acetaminophen 5-500 Mg Tabs (Hydrocodone-acetaminophen) .Marland Kitchen... 1-2 q6 -8 hrs   Complete Medication List: 1)  Meclizine Hcl 25 Mg Tabs (Meclizine hcl) .... 1/2-1 tab po every 8 hours as needed for dizziness 2)  Proscar 5 Mg Tabs (Finasteride) .Marland Kitchen.. 1 by mouth once daily 3)  Clonazepam 0.5 Mg Tabs (Clonazepam) .... Take as directed 4)  Aggrenox 25-200 Mg Cp12 (Aspirin-dipyridamole) .Marland Kitchen.. 1 bid 5)  Lipitor 10 Mg Tabs (Atorvastatin calcium) .Marland Kitchen.. 1 by mouth qd 6)  Hydrocodone-acetaminophen 5-500 Mg Tabs (Hydrocodone-acetaminophen) .Marland KitchenMarland KitchenMarland Kitchen  1-2 q6 -8 hrs 7)  Valtrex 1 Gm Tabs (Valacyclovir hcl) .Marland Kitchen.. 1 by mouth three times a day x 1 week   Patient Instructions: 1)  ICE three times a day  2)  capsaicin cream 3 times a day 3)  continue w/ hydrocodone as needed pain  4)  IF you develop a rash : call us and start Valtrex. DO NOT start valtrex if you have no rash   Prescriptions: VALTREX 1 GM TABS (VALACYCLOVIR HCL) 1 by mouth three times a day x 1 week  #21 x 0   Entered and Authorized by:   Nolon Rod. Sundae Maners MD   Signed by:   Nolon Rod. Brandilynn Taormina MD on 05/14/2008   Method used:   Print then Give to Patient   RxID:   [id number]  ]

## 2010-08-22 NOTE — Letter (Signed)
Summary: Results Follow up Letter  [place] [facility]  [address] [place], [place] [address]   Phone: [phone]  Fax: [phone]    04/16/2007 MRN: [id number]  Justin [name] [address] [place], [place]  [address]  Dear [name],  The following are the results of your recent test(s):  Test         Result    Pap Smear:        Normal _____  Not Normal _____ Comments: ______________________________________________________ Cholesterol: LDL(Bad cholesterol):         Your goal is less than:         HDL (Good cholesterol):       Your goal is more than: Comments:  ______________________________________________________ Mammogram:        Normal _____  Not Normal _____ Comments:  ___________________________________________________________________ Hemoccult:        Normal _____  Not normal _______ Comments:    _____________________________________________________________________ Other Tests:   Please see attached results and comments  We routinely do not discuss normal results over the telephone.  If you desire a copy of the results, or you have any questions about this information we can discuss them at your next office visit.   Sincerely,

## 2010-08-22 NOTE — Assessment & Plan Note (Signed)
Summary: b12--tl   Nurse Visit    Prior Medications: MECLIZINE HCL 25 MG TABS (MECLIZINE HCL) 1/2-1 TAB PO EVERY 8 HOURS as needed FOR DIZZINESS PROSCAR 5 MG  TABS (FINASTERIDE) 1 by mouth once daily CLONAZEPAM 0.5 MG  TABS (CLONAZEPAM) TAKE AS DIRECTED AGGRENOX 25-200 MG  CP12 (ASPIRIN-DIPYRIDAMOLE) 1 bid LIPITOR 10 MG  TABS (ATORVASTATIN CALCIUM) 1 by mouth qd DOXYCYCLINE MONOHYDRATE 100 MG  CAPS (DOXYCYCLINE MONOHYDRATE) 1 two times a day X 5 days then 1 once daily HYCODAN 5-1.5 MG/5ML  SYRP (HYDROCODONE-HOMATROPINE) 1 tsp q 6 hrs prn Current Allergies: ! PREDNISONE    Medication Administration  Injection # 1:    Medication: Vit B12 1000 mcg    Diagnosis: ANEMIA (ICD-285.9)    Route: IM    Site: L deltoid    Exp Date: 10/2009    Lot #: [id number]    Mfr: American Regent    Patient tolerated injection without complications    Given by: Floydene Flock CMA (January 22, 2008 3:22 PM)  Orders Added: 1)  Vit B12 1000 mcg [J3420] 2)  Admin of Therapeutic Inj  intramuscular or subcutaneous Lepidus.Putnam    ]  Medication Administration  Injection # 1:    Medication: Vit B12 1000 mcg    Diagnosis: ANEMIA (ICD-285.9)    Route: IM    Site: L deltoid    Exp Date: 10/2009    Lot #: [id number]    Mfr: American Regent    Patient tolerated injection without complications    Given by: Floydene Flock CMA (January 22, 2008 3:22 PM)  Orders Added: 1)  Vit B12 1000 mcg [J3420] 2)  Admin of Therapeutic Inj  intramuscular or subcutaneous [57846]

## 2010-08-22 NOTE — Letter (Signed)
Summary: [facility]  [facility]   Imported By: Lanelle Bal 10/06/2009 10:55:53  _____________________________________________________________________  External Attachment:    Type:   Image     Comment:   External Document

## 2010-08-22 NOTE — Assessment & Plan Note (Signed)
Summary: BLOOD IN STOOL/CBS   Vital Signs:  Patient profile:   77 year old male Weight:      184.6 pounds Temp:     98.2 degrees F oral Pulse rate:   72 / minute Resp:     17 per minute BP sitting:   102 / 60  (left arm) Cuff size:   large  Vitals Entered By: Shonna Chock (January 07, 2009 2:15 PM) CC: BLOOD IN STOOL X 2 DAYS-BOWELS NORMAL   Primary Care Provider:  [facility]  CC:  BLOOD IN STOOL X 2 DAYS-BOWELS NORMAL.  History of Present Illness: Painless rectal bleeding  after straining with BM due to constipation. Intermittent constipation. PMH of colon polyps ; due this year. " My colon is crooked  according to Dr Doreatha Martin" PMH of rectal bleeding 1980 due to ruptured diverticulum (?). PMH of hemorrhoidectomy in [employer]  Allergies: 1)  ! Prednisone  Past History:  Past Medical History: Benign prostatic hypertrophy Hyperlipidemia Nephrolithiasis, hx of Transient ischemic attack, hx of; Dr Pearlean Brownie Colonic polyps, hx of, Dr Victorino Dike Diverticulosis, colon  Past Surgical History: Lithotripsy, Dr Logan Bores Hemorrhoidectomy Colon polypectomy, due 2010, Dr Victorino Dike  Family History: F MI 55; M colon CA; bro prostate CA  Review of Systems General:  Denies chills and fever; Weight loss  due to decreased fat in diet. GI:  Denies abdominal pain, dark tarry stools, and indigestion.  Physical Exam  General:  Appears younger than age,well-nourished,in no acute distress; alert,appropriate and cooperative throughout examination Abdomen:  Bowel sounds positive,abdomen soft and non-tender without masses, organomegaly or hernias noted. Rectal:  external hemorrhoid(s).   Erythema of anus. Stool soft & brown ; FOB + Cervical Nodes:  No lymphadenopathy noted Axillary Nodes:  No palpable lymphadenopathy   Impression & Recommendations:  Problem # 1:  RECTAL BLEEDING (ICD-569.3)  Orders: Venipuncture ([address]) TLB-CBC Platelet - w/Differential (85025-CBCD) Gastroenterology Referral  (GI)  Problem # 2:  COLONIC POLYPS, HX OF (ICD-V12.72)  Orders: Venipuncture ([address]454) Gastroenterology Referral (GI)  Complete Medication List: 1)  Proscar 5 Mg Tabs (Finasteride) .Marland Kitchen.. 1 by mouth once daily 2)  Aggrenox 25-200 Mg Cp12 (Aspirin-dipyridamole) .Marland Kitchen.. 1 bid 3)  Lipitor 10 Mg Tabs (Atorvastatin calcium) .Marland Kitchen.. 1 by mouth qd 4)  Flexeril 10 Mg Tabs (Cyclobenzaprine hcl) .Marland Kitchen.. 1 by mouth three times a day as needed 5)  Pramoxine Hcl 1 % Foam (Pramoxine hcl) .... Once daily - three times a day as needed  Patient Instructions: 1)  Drink clear liquids only for the next 24 hours, then slowly add other liquids and food as you  tolerate them. Prescriptions: PRAMOXINE HCL 1 % FOAM (PRAMOXINE HCL) once daily - three times a day as needed  #1 pack x 1   Entered and Authorized by:   Marga Melnick MD   Signed by:   Marga Melnick MD on 01/07/2009   Method used:   Print then Give to Patient   RxID:   [id number]

## 2010-08-22 NOTE — Letter (Signed)
Summary: [facility]  [facility]   Imported By: Lanelle Bal 06/02/2008 08:53:42  _____________________________________________________________________  External Attachment:    Type:   Image     Comment:   External Document

## 2010-08-24 ENCOUNTER — Encounter (INDEPENDENT_AMBULATORY_CARE_PROVIDER_SITE_OTHER): Payer: Self-pay | Admitting: *Deleted

## 2010-08-24 ENCOUNTER — Ambulatory Visit (INDEPENDENT_AMBULATORY_CARE_PROVIDER_SITE_OTHER): Payer: Medicare Other

## 2010-08-24 DIAGNOSIS — D649 Anemia, unspecified: Secondary | ICD-10-CM

## 2010-08-24 NOTE — Assessment & Plan Note (Signed)
Summary: B-12//PH   Nurse Visit   Allergies: 1)  ! Prednisone  Medication Administration  Injection # 1:    Medication: Vit B12 1000 mcg    Diagnosis: ANEMIA (ICD-285.9)    Route: IM    Site: L deltoid    Exp Date: 09/21/2011    Lot #: [id number]    Mfr: American Regent    Patient tolerated injection without complications    Given by: Almeta Monas CMA Duncan Dull) (July 20, 2010 11:57 AM)  Orders Added: 1)  Vit B12 1000 mcg [J3420] 2)  Admin of Therapeutic Inj  intramuscular or subcutaneous [27253]

## 2010-08-24 NOTE — Progress Notes (Signed)
Summary: Fyi--Driving  Phone Note Call from Patient   Caller: Spouse (ANN) Summary of Call: Pts wife walked back in after leaving the appointment today. She really wants Dr. Alwyn Ren to take her husband "off of the road" and she is begging that this not get back to Patient. Initial call taken by: Lavell Islam,  July 28, 2010 1:47 PM  Follow-up for Phone Call        noted; he was told not to drive until cleared. Neurology appt will be scheduled. Follow-up by: Marga Melnick MD,  July 30, 2010 4:30 PM

## 2010-08-24 NOTE — Assessment & Plan Note (Signed)
Summary: WEAKNESS FEEL OFF BALANCE/CDJ   Vital Signs:  Patient profile:   77 year old male Height:      70.75 inches (179.71 cm) Weight:      182 pounds (82.73 kg) BMI:     25.66 Temp:     97.4 degrees F (36.33 degrees C) oral Resp:     15 per minute BP sitting:   130 / 70  (left arm)  Vitals Entered By: Lucious Groves CMA (July 28, 2010 12:51 PM) CC: C/O weakness and feeling off balance./kb, Syncope Is Patient Diabetic? No Pain Assessment Patient in pain? no      Comments Patient notes that he did a lot of driving and very little eating. He denies HA, dizziness, SOB, chest pain, fatigue, and congestion.   Primary Care Provider:  Marga Melnick, MD  CC:  C/O weakness and feeling off balance./kb and Syncope.  History of Present Illness:      This is an 77 year old man who presents with  acue dizziness this am. The patient reports lightheadedness, but denies  near or frank loss of consciousness, premonitory symptoms, palpitations, chest pain, shortness of breath, and incontinence.  Associated symptoms include pallor.  The patient denies the following symptoms: headache, abdominal discomfort, nausea, vomiting, feeling warm, diaphoresis, focal weakness, blurred vision, perioral numbness, bite injury of tongue, witnessed limb jerking, and witnessed confusion  after symptom resolution.  He  had driven  to & back from [place] the day prior to this event. He ate breakfast that morning but did not eat again for > 12 hrs. The acute dizziness occurred this am prior to his eating  breakfast. PMH of TIA 04/23/2007. Last Neuro evaluation was in 01/2010; F/U recommended for 10/2010.  Current Medications (verified): 1)  Proscar 5 Mg  Tabs (Finasteride) .Marland Kitchen.. 1 By Mouth Once Daily 2)  Zocor 20 Mg Tabs (Simvastatin) .... Take 1 Tablet By Mouth Once A Day 3)  Bayer Aspirin 325 Mg Tabs (Aspirin) .Marland Kitchen.. 1 By Mouth Once Daily 4)  Saw Palmetto .... Take Once Daily  Allergies (verified): 1)  !  Prednisone  Physical Exam  General:  in no acute distress; alert,appropriate and cooperative throughout examination Eyes:  No corneal or conjunctival inflammation noted. EOMI. Perrla. Fied of  Vision grossly normal. Mouth:  No tongue deviation Lungs:  Normal respiratory effort, chest expands symmetrically. Lungs are clear to auscultation, no crackles or wheezes. Heart:  Normal rate and regular rhythm. S1 and S2 normal without gallop, murmur, click, rub. S4 with slurring Pulses:  R and L carotid,radial  pulses are full and equal bilaterally Extremities:  No clubbing, cyanosis, edema. Neurologic:  alert & oriented X3, cranial nerves II-XII intact, strength normal in all extremities, sensation intact to light touch, gait normal, DTRs symmetrical and normal, and Romberg negative.   Skin:  Intact without suspicious lesions or rashes Cervical Nodes:  No lymphadenopathy noted Axillary Nodes:  No palpable lymphadenopathy Psych:  flat affect and subdued.  Judgement questionable based on driving for > 12 hrs w/o eating; especially in context of age & PMH of TIA   Impression & Recommendations:  Problem # 1:  DIZZINESS (ICD-780.4) ? hypoglycemia from prolonged fasting Orders: Venipuncture (16109) TLB-BMP (Basic Metabolic Panel-BMET) (80048-METABOL) TLB-CBC Platelet - w/Differential (85025-CBCD) EKG w/ Interpretation (93000) Specimen Handling (60454)  Problem # 2:  TRANSIENT ISCHEMIC ATTACK, HX OF (ICD-V12.50) 04/2007  Complete Medication List: 1)  Proscar 5 Mg Tabs (Finasteride) .Marland Kitchen.. 1 by mouth once daily 2)  Zocor 20 Mg Tabs (Simvastatin) .... Take 1 tablet by mouth once a day 3)  Bayer Aspirin 325 Mg Tabs (Aspirin) .Marland Kitchen.. 1 by mouth once daily 4)  Saw Palmetto  .... Take once daily  Patient Instructions: 1)  Do not  drive until cleared. Neurology had recommended return visit in 10/2010 . Because of this event & past history of TIA ; you should be seen now rather than in  10/2010.   Orders Added: 1)  Est. Patient Level IV [04540] 2)  Venipuncture [36415] 3)  TLB-BMP (Basic Metabolic Panel-BMET) [80048-METABOL] 4)  TLB-CBC Platelet - w/Differential [85025-CBCD] 5)  EKG w/ Interpretation [93000] 6)  Specimen Handling [99000]

## 2010-08-30 NOTE — Assessment & Plan Note (Signed)
   Nurse Visit   Allergies: 1)  ! Prednisone  Medication Administration  Injection # 1:    Medication: Vit B12 1000 mcg    Diagnosis: ANEMIA (ICD-285.9)    Route: IM    Site: L deltoid    Exp Date: 09/21/2011    Lot #: [id number]    Mfr: American Regent    Patient tolerated injection without complications    Given by: Jeremy Johann CMA (August 24, 2010 2:38 PM)  Orders Added: 1)  Admin of Therapeutic Inj  intramuscular or subcutaneous [96372] 2)  Vit B12 1000 mcg [J3420]

## 2010-09-18 ENCOUNTER — Encounter: Payer: Self-pay | Admitting: Internal Medicine

## 2010-09-27 ENCOUNTER — Encounter: Payer: Self-pay | Admitting: Internal Medicine

## 2010-09-27 ENCOUNTER — Ambulatory Visit (INDEPENDENT_AMBULATORY_CARE_PROVIDER_SITE_OTHER): Payer: Medicare Other

## 2010-09-27 DIAGNOSIS — D649 Anemia, unspecified: Secondary | ICD-10-CM

## 2010-09-28 NOTE — Consult Note (Signed)
Summary: [facility]  [facility]   Imported By: Maryln Gottron 09/22/2010 09:35:57  _____________________________________________________________________  External Attachment:    Type:   Image     Comment:   External Document

## 2010-10-03 ENCOUNTER — Encounter: Payer: Self-pay | Admitting: Internal Medicine

## 2010-10-03 NOTE — Assessment & Plan Note (Signed)
Summary: b12 shot   Nurse Visit   Allergies: 1)  ! Prednisone  Medication Administration  Injection # 1:    Medication: Vit B12 1000 mcg    Diagnosis: ANEMIA (ICD-285.9)    Route: IM    Site: L deltoid    Exp Date: 09/21/2011    Lot #: [id number]    Mfr: American Regent    Given by: Doristine Devoid CMA (September 27, 2010 10:37 AM)  Orders Added: 1)  Vit B12 1000 mcg [J3420] 2)  Admin of Therapeutic Inj  intramuscular or subcutaneous [96372]   Medication Administration  Injection # 1:    Medication: Vit B12 1000 mcg    Diagnosis: ANEMIA (ICD-285.9)    Route: IM    Site: L deltoid    Exp Date: 09/21/2011    Lot #: [id number]    Mfr: American Regent    Given by: Doristine Devoid CMA (September 27, 2010 10:37 AM)  Orders Added: 1)  Vit B12 1000 mcg [J3420] 2)  Admin of Therapeutic Inj  intramuscular or subcutaneous [11914]

## 2010-10-13 ENCOUNTER — Encounter: Payer: Self-pay | Admitting: Internal Medicine

## 2010-10-16 ENCOUNTER — Encounter: Payer: Self-pay | Admitting: Internal Medicine

## 2010-10-17 ENCOUNTER — Encounter: Payer: Self-pay | Admitting: Internal Medicine

## 2010-10-24 ENCOUNTER — Ambulatory Visit (INDEPENDENT_AMBULATORY_CARE_PROVIDER_SITE_OTHER): Payer: Medicare Other | Admitting: *Deleted

## 2010-10-24 ENCOUNTER — Ambulatory Visit: Payer: Medicare Other

## 2010-10-24 DIAGNOSIS — E538 Deficiency of other specified B group vitamins: Secondary | ICD-10-CM

## 2010-10-24 MED ORDER — CYANOCOBALAMIN 1000 MCG/ML IJ SOLN
1000.0000 ug | Freq: Once | INTRAMUSCULAR | Status: AC
  Administered 2010-10-24: 1000 ug via INTRAMUSCULAR

## 2010-11-21 ENCOUNTER — Ambulatory Visit (INDEPENDENT_AMBULATORY_CARE_PROVIDER_SITE_OTHER): Payer: Medicare Other | Admitting: *Deleted

## 2010-11-21 DIAGNOSIS — E538 Deficiency of other specified B group vitamins: Secondary | ICD-10-CM

## 2010-11-21 MED ORDER — CYANOCOBALAMIN 1000 MCG/ML IJ SOLN
1000.0000 ug | Freq: Once | INTRAMUSCULAR | Status: AC
  Administered 2010-11-21: 1000 ug via INTRAMUSCULAR

## 2010-12-08 NOTE — Op Note (Signed)
NAMEMENDEL, BINSFELD NO.:  [id number]   MEDICAL RECORD NO.:  [id number]          PATIENT TYPE:  AMB   LOCATION:  [facility]                         FACILITY:  [facility]   PHYSICIAN:  Jamison Neighbor, M.D.  DATE OF BIRTH:  09-13-1924   DATE OF PROCEDURE:  10/10/2004  DATE OF DISCHARGE:                                 OPERATIVE REPORT   PREOPERATIVE DIAGNOSIS:  Renal calculi in right pelvic kidney.   POSTOPERATIVE DIAGNOSIS:  Renal calculi in right pelvic kidney.   PROCEDURES:  1.  Cystoscopy.  2.  Right retrograde.  3.  Right ureteroscopy.   SURGEON:  Jamison Neighbor, M.D.   ANESTHESIA:  General.   COMPLICATIONS:  None.   DRAINS:  None.   BRIEF HISTORY:  This 77 year old male is status post ESWL for kidney stones  in the right pelvic kidney.  The patient had had some vague pain on that  side and it was felt that we should try to eliminate these stones if  possible.  The stones have not passed.  He is now to undergo diagnostic  retrograde and ureteroscopy with extraction if possible.  The patient  understands the risks and benefits of the procedure and gave full informed  consent.   DESCRIPTION OF PROCEDURE:  After successful induction of general anesthesia,  the patient was placed in the dorsolithotomy position, prepped with Betadine  and draped in the usual sterile fashion.  Careful inspection of the urethra  showed no evidence of any obstruction.  The prostate was not particularly  enlarged.  The bladder itself was not trabeculated.  Both ureteral orifices  were unremarkable in their appearance and position.  The retrograde study on  the right-hand side showed a very curvilinear ureter to a pelvic kidney.  Stones could be identified within the collecting system.  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  A guidewire was passed up into the  kidney.  The rigid  ureteroscope was then passed.  It could only pass  approximately two-thirds of the way up when there was a significant right  angle turn to the ureter.  This was followed by a balloon dilator to open  that area and then a ureteral access sheath, which did allow the  ureteroscope to be advanced up into the kidney.  The __________ ureteroscope  was utilized.  The kidney itself was inspected.  The stones were difficult  to visualized, but did appear to be impacted somewhat in the lower pole  segment and really not in the collecting system proper.  It was not felt  that these stones were at significant high risk to pass.  Because there were  two turns in the ureter and then a turn into the kidney, it was impossible  to pass a laser fiber all the way down to those stones.  For that reason, it  was felt they should be left alone.  It was felt, however, that if the  stones should pass, they would be  able to pass through the ureter now which  had been successfully dilated with the ureteral access sheath.  The ureter  was inspected on the way out and was not injured in any way.  It was not  felt that a stent would be appropriate and indeed it would be actually  somewhat difficult to a stent to stay in place given the pelvic nature of  this kidney.  The bladder was drained.  The patient tolerated the procedure  well and was taken to the recovery room in good condition.  He will be given  a prescription for Tylox, as well as Levaquin and will be monitored  carefully.  He does know that if he should have a stone pass down into the  ureter that given the dilation it should be able to pass without much  difficulty.  If it turns out that the stone is giving him problems and is  still in the same location, consideration would be given to referral to  Dorette Grate, M.D., at [facility].      RJE/MEDQ  D:  10/10/2004  T:  10/10/2004  Job:  [id number]   cc:   Titus Dubin. Alwyn Ren, M.D. [facility]

## 2010-12-21 ENCOUNTER — Ambulatory Visit (INDEPENDENT_AMBULATORY_CARE_PROVIDER_SITE_OTHER): Payer: Medicare Other | Admitting: *Deleted

## 2010-12-21 DIAGNOSIS — E538 Deficiency of other specified B group vitamins: Secondary | ICD-10-CM

## 2010-12-21 MED ORDER — CYANOCOBALAMIN 1000 MCG/ML IJ SOLN
1000.0000 ug | Freq: Once | INTRAMUSCULAR | Status: AC
  Administered 2010-12-21: 1000 ug via INTRAMUSCULAR

## 2011-01-09 ENCOUNTER — Ambulatory Visit (INDEPENDENT_AMBULATORY_CARE_PROVIDER_SITE_OTHER): Payer: Medicare Other | Admitting: Internal Medicine

## 2011-01-09 ENCOUNTER — Encounter: Payer: Self-pay | Admitting: Internal Medicine

## 2011-01-09 VITALS — BP 138/70 | HR 60 | Temp 97.6°F | Wt 182.0 lb

## 2011-01-09 DIAGNOSIS — M79609 Pain in unspecified limb: Secondary | ICD-10-CM

## 2011-01-09 DIAGNOSIS — M545 Low back pain, unspecified: Secondary | ICD-10-CM

## 2011-01-09 DIAGNOSIS — M79622 Pain in left upper arm: Secondary | ICD-10-CM

## 2011-01-09 LAB — POCT URINALYSIS DIPSTICK
Bilirubin, UA: NEGATIVE
Blood, UA: NEGATIVE
Glucose, UA: NEGATIVE
Ketones, UA: NEGATIVE
Leukocytes, UA: NEGATIVE
Nitrite, UA: NEGATIVE
Protein, UA: NEGATIVE
Spec Grav, UA: 1.005
Urobilinogen, UA: 0.2
pH, UA: 6.5

## 2011-01-09 MED ORDER — TRAMADOL HCL 50 MG PO TABS: 50.0000 mg | ORAL_TABLET | Freq: Four times a day (QID) | ORAL | Status: DC | PRN

## 2011-01-09 NOTE — Patient Instructions (Signed)
Physical Therapy or Chiropractry  if back pain & arm pain persist or progress

## 2011-01-09 NOTE — Progress Notes (Signed)
  Subjective:    Patient ID: Justin [name], male    DOB: 1924-12-24, 77 y.o.   MRN: [id number]  HPI BACK PAIN Location: L flank above hip  Quality: sharp   Onset: 10 days ago Worse with: sitting in soft chair  Better with: no Rx  Radiation: no Trauma: ? Heavy pulling up to 1000 #  in container on wheels  Red Flags Fecal/urinary incontinence: no  Numbness/Weakness: no  Fever/chills/sweats: no  Night pain: no  Unexplained weight loss: no  No relief with bedrest: no  h/o cancer/immunosuppression: no   PMH of osteoporosis or chronic steroid use: no He has had positional LUE deltoid  pain X 7-10 days ; it resolved spontaneously this am      Review of Systems easy bruising since Plavix Rxed by Neurologist     Objective:   Physical Exam Gen.: Healthy and well-nourished in appearance. Alert, appropriate and cooperative throughout exam. Head: Normocephalic without obvious abnormalities Neck: No deformities, masses, or tenderness noted. Range of motion & . Thyroid normal. Lungs: Normal respiratory effort; chest expands symmetrically. Lungs are clear to auscultation without rales, wheezes, or increased work of breathing. Heart: Normal rate and rhythm. Normal S1 and S2. No gallop, click, or rub. No  murmur. Abdomen: Bowel sounds normal; abdomen soft and nontender. No masses, organomegaly or hernias noted.No AAA or bruits Musculoskeletal/extremities: No deformity or scoliosis noted of  the thoracic or lumbar spine. No clubbing, cyanosis, edema, or deformity noted. Range of motion  normal .Tone & strength  normal.Joints normal. Nail health  Good. He lay down & sat up w/o help. Vascular: Carotid, radial artery, dorsalis pedis and dorsalis posterior tibial pulses are full and equal. No bruits present. Neurologic: Alert and oriented x3. Deep tendon reflexes symmetrical and 1/2+. Slight limp R foot with tip toe walking. No foot drop.          Skin: Intact without suspicious lesions or  rashes. Scattered ecchymoses Lymph: No cervical or axillary  lymphadenopathy present. Psych: Mood and affect are normal. Normally interactive                                                                                         Assessment & Plan:  #1 LBS #2 LU arm pain resolved,? C6 radiculopathy Plan : see Orders

## 2011-01-19 ENCOUNTER — Ambulatory Visit (INDEPENDENT_AMBULATORY_CARE_PROVIDER_SITE_OTHER): Payer: Medicare Other | Admitting: *Deleted

## 2011-01-19 DIAGNOSIS — E538 Deficiency of other specified B group vitamins: Secondary | ICD-10-CM

## 2011-01-19 MED ORDER — CYANOCOBALAMIN 1000 MCG/ML IJ SOLN
1000.0000 ug | Freq: Once | INTRAMUSCULAR | Status: AC
  Administered 2011-01-19: 1000 ug via INTRAMUSCULAR

## 2011-02-19 ENCOUNTER — Ambulatory Visit (INDEPENDENT_AMBULATORY_CARE_PROVIDER_SITE_OTHER): Payer: Medicare Other | Admitting: *Deleted

## 2011-02-19 DIAGNOSIS — E538 Deficiency of other specified B group vitamins: Secondary | ICD-10-CM

## 2011-02-19 MED ORDER — CYANOCOBALAMIN 1000 MCG/ML IJ SOLN
1000.0000 ug | Freq: Once | INTRAMUSCULAR | Status: AC
  Administered 2011-02-19: 1000 ug via INTRAMUSCULAR

## 2011-03-22 ENCOUNTER — Ambulatory Visit (INDEPENDENT_AMBULATORY_CARE_PROVIDER_SITE_OTHER): Payer: Medicare Other | Admitting: *Deleted

## 2011-03-22 DIAGNOSIS — E538 Deficiency of other specified B group vitamins: Secondary | ICD-10-CM

## 2011-03-22 MED ORDER — CYANOCOBALAMIN 1000 MCG/ML IJ SOLN
1000.0000 ug | Freq: Once | INTRAMUSCULAR | Status: AC
  Administered 2011-03-22: 1000 ug via INTRAMUSCULAR

## 2011-04-25 ENCOUNTER — Ambulatory Visit (INDEPENDENT_AMBULATORY_CARE_PROVIDER_SITE_OTHER): Payer: Medicare Other

## 2011-04-25 DIAGNOSIS — D519 Vitamin B12 deficiency anemia, unspecified: Secondary | ICD-10-CM

## 2011-04-25 DIAGNOSIS — D518 Other vitamin B12 deficiency anemias: Secondary | ICD-10-CM

## 2011-04-25 MED ORDER — CYANOCOBALAMIN 1000 MCG/ML IJ SOLN
1000.0000 ug | Freq: Once | INTRAMUSCULAR | Status: AC
  Administered 2011-04-25: 1000 ug via INTRAMUSCULAR

## 2011-05-25 ENCOUNTER — Ambulatory Visit (INDEPENDENT_AMBULATORY_CARE_PROVIDER_SITE_OTHER): Payer: Medicare Other | Admitting: Internal Medicine

## 2011-05-25 DIAGNOSIS — Z23 Encounter for immunization: Secondary | ICD-10-CM

## 2011-05-25 DIAGNOSIS — E538 Deficiency of other specified B group vitamins: Secondary | ICD-10-CM

## 2011-05-25 MED ORDER — CYANOCOBALAMIN 1000 MCG/ML IJ SOLN
1000.0000 ug | Freq: Once | INTRAMUSCULAR | Status: AC
  Administered 2011-05-25: 1000 ug via INTRAMUSCULAR

## 2011-05-27 NOTE — Progress Notes (Signed)
  Subjective:    Patient ID: Justin [name], male    DOB: Oct 18, 1924, 77 y.o.   MRN: [id number]  HPI    Review of Systems     Objective:   Physical Exam        Assessment & Plan:

## 2011-06-05 ENCOUNTER — Encounter: Payer: Self-pay | Admitting: Internal Medicine

## 2011-06-06 ENCOUNTER — Ambulatory Visit (INDEPENDENT_AMBULATORY_CARE_PROVIDER_SITE_OTHER): Payer: Medicare Other | Admitting: Internal Medicine

## 2011-06-06 ENCOUNTER — Encounter: Payer: Self-pay | Admitting: Internal Medicine

## 2011-06-06 DIAGNOSIS — Z0000 Encounter for general adult medical examination without abnormal findings: Secondary | ICD-10-CM

## 2011-06-06 DIAGNOSIS — Z86010 Personal history of colonic polyps: Secondary | ICD-10-CM

## 2011-06-06 DIAGNOSIS — G250 Essential tremor: Secondary | ICD-10-CM

## 2011-06-06 DIAGNOSIS — Z8679 Personal history of other diseases of the circulatory system: Secondary | ICD-10-CM

## 2011-06-06 DIAGNOSIS — E782 Mixed hyperlipidemia: Secondary | ICD-10-CM

## 2011-06-06 DIAGNOSIS — Z85828 Personal history of other malignant neoplasm of skin: Secondary | ICD-10-CM

## 2011-06-06 LAB — LIPID PANEL
Cholesterol: 127 mg/dL (ref 0–200)
HDL: 54 mg/dL (ref >39.00)
LDL Cholesterol: 56 mg/dL (ref 0–99)
Total CHOL/HDL Ratio: 2
Triglycerides: 84 mg/dL (ref 0.0–149.0)
VLDL: 16.8 mg/dL (ref 0.0–40.0)

## 2011-06-06 LAB — BASIC METABOLIC PANEL
BUN: 18 mg/dL (ref 6–23)
CO2: 30 mEq/L (ref 19–32)
Calcium: 8.9 mg/dL (ref 8.4–10.5)
Chloride: 104 mEq/L (ref 96–112)
Creatinine, Ser: 0.9 mg/dL (ref 0.4–1.5)
GFR: 91.28 mL/min (ref >60.00)
Glucose, Bld: 79 mg/dL (ref 70–99)
Potassium: 3.8 mEq/L (ref 3.5–5.1)
Sodium: 140 mEq/L (ref 135–145)

## 2011-06-06 LAB — HEPATIC FUNCTION PANEL
ALT: 17 U/L (ref 0–53)
AST: 22 U/L (ref 0–37)
Albumin: 3.7 g/dL (ref 3.5–5.2)
Alkaline Phosphatase: 65 U/L (ref 39–117)
Bilirubin, Direct: 0.1 mg/dL (ref 0.0–0.3)
Total Bilirubin: 0.6 mg/dL (ref 0.3–1.2)
Total Protein: 7 g/dL (ref 6.0–8.3)

## 2011-06-06 LAB — CBC WITH DIFFERENTIAL/PLATELET
Basophils Absolute: 0 10*3/uL (ref 0.0–0.1)
Basophils Relative: 0.5 % (ref 0.0–3.0)
Eosinophils Absolute: 0.1 10*3/uL (ref 0.0–0.7)
Eosinophils Relative: 1.6 % (ref 0.0–5.0)
HCT: 38.8 % — ABNORMAL LOW (ref 39.0–52.0)
Hemoglobin: 13.2 g/dL (ref 13.0–17.0)
Lymphocytes Relative: 18.2 % (ref 12.0–46.0)
Lymphs Abs: 1 10*3/uL (ref 0.7–4.0)
MCHC: 34 g/dL (ref 30.0–36.0)
MCV: 97.5 fl (ref 78.0–100.0)
Monocytes Absolute: 0.6 10*3/uL (ref 0.1–1.0)
Monocytes Relative: 11.1 % (ref 3.0–12.0)
Neutro Abs: 3.6 10*3/uL (ref 1.4–7.7)
Neutrophils Relative %: 68.6 % (ref 43.0–77.0)
Platelets: 186 10*3/uL (ref 150.0–400.0)
RBC: 3.98 Mil/uL — ABNORMAL LOW (ref 4.22–5.81)
RDW: 13.2 % (ref 11.5–14.6)
WBC: 5.3 10*3/uL (ref 4.5–10.5)

## 2011-06-06 LAB — TSH: TSH: 3.2 u[IU]/mL (ref 0.35–5.50)

## 2011-06-06 MED ORDER — PROPRANOLOL HCL 10 MG PO TABS: ORAL_TABLET | ORAL | Status: DC

## 2011-06-06 NOTE — Progress Notes (Signed)
Subjective:    Patient ID: Justin [name], male    DOB: [birth year], 77 y.o.   MRN: [id number]  HPI Medicare Wellness Visit:  The following psychosocial & medical history were reviewed as required by Medicare.   Social history: caffeine: 2 cups , alcohol:  no ,  tobacco use : quit 1978  & exercise : farm work.   Home & personal  safety / fall risk: no issues, activities of daily living: no limitations , seatbelt use : yes , and smoke alarm employment : yes .  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 Decreased hearing to whisper @ 6 ft. Audiology evaluation recommended. Orientation :oriented X3 , memory & recall: good , math testing: good,and mood & affect : normal . Depression / anxiety: no issues Travel history : [place] [address] , immunization status :Shingles needed , transfusion history:  no, and preventive health surveillance ( colonoscopies, BMD , etc as per protocol/ [facility]): colonoscopy 2009, Dental care:  Every 6 mos . Chart reviewed &  Updated. Active issues reviewed & addressed.       Review of Systems Dyslipidemia assessment: Family history of premature CAD/ MI: father MI @ 14;2  P uncles MI , both > 55 .  Nutrition: no .  Diabetes : no . HTN: no.  Weight :  stable. ROS: fatigue: no ; chest pain : no ;claudication: no; palpitations: no; abd pain/bowel changes: no ; myalgias:no;  syncope : no ; memory loss: no;skin changes: no.  He recently has seen his neurologist; he states that the neurologist has released him from followup. He describes an intention tremor of the right upper extremity       Objective:   Physical Exam Gen.: Healthy and well-nourished in appearance. Alert, appropriate and cooperative throughout exam. Head: Normocephalic without obvious abnormalities;  pattern  alopecia  Eyes: No corneal or conjunctival inflammation noted.Arcus senilis. Ptosis, OD > OS.  Extraocular motion intact.  Ears: External  ear exam reveals no significant lesions or deformities. Canals clear .TMs normal.  Nose: External nasal exam reveals no deformity or inflammation. Nasal mucosa are pink and moist. No lesions or exudates noted.  Mouth: Oral mucosa and oropharynx reveal no lesions or exudates. Teeth in good repair. Neck: No deformities, masses, or tenderness noted. Range of motion & Thyroid normal. Lungs: Normal respiratory effort; chest expands symmetrically. Lungs are clear to auscultation without rales, wheezes, or increased work of breathing. Heart: Normal rate and rhythm. Normal S1 and S2. No gallop, click, or rub. Grade 1/2 -1 systolic murmur. Abdomen: Bowel sounds normal; abdomen soft and nontender. No masses, organomegaly or hernias noted. Genitalia: deferred to Dr Logan Bores   .                                                                                   Musculoskeletal/extremities: No deformity or scoliosis noted of  the thoracic or lumbar spine. No clubbing, cyanosis, edema, or deformity noted. Range of motionessentially  normal .Tone & strength  normal.Joints normal. Nail health  good. Vascular: Carotid, radial  artery, dorsalis pedis and  posterior tibial pulses are full and equal. No bruits present. Neurologic: Alert and oriented x3. Deep tendon reflexes symmetrical and normal.          Skin: Intact without suspicious lesions or rashes. Keratoses Lymph: No cervical, axillary  lymphadenopathy present. Psych: Mood and affect are normal. Normally interactive                                                                                         Assessment & Plan:  #1 Medicare Wellness Exam; criteria met ; data entered #2 Problem List reviewed ; Assessment/ Recommendations made  #3 past medical history TIA. I explained that he needs to document that his neurologist is aware that he has an active DOT license.  #4 essential tremor, right hand. Low-dose beta  blocker recommended. He should avoid stimulants  Plan: see Orders

## 2011-06-06 NOTE — Patient Instructions (Signed)
Preventive Health Care: Exercise at least 30-45 minutes a day,  3-4 days a week.  Eat a low-fat diet with lots of fruits and vegetables, up to 7-9 servings per day. Consume less than 40 grams of sugar per day from foods & drinks with High Fructose Corn Sugar as # 1,2,3 or # 4 on label. Health Care Power of Attorney & Living Will. Complete if not in place ; these place you in charge of your health care decisions. As we discussed; please verify the DOT licensing  physician is aware of your prior TIA. Your neurologist must be  informed that you do have an active DOT license.

## 2011-07-19 ENCOUNTER — Ambulatory Visit (INDEPENDENT_AMBULATORY_CARE_PROVIDER_SITE_OTHER): Payer: Medicare Other | Admitting: *Deleted

## 2011-07-19 DIAGNOSIS — D519 Vitamin B12 deficiency anemia, unspecified: Secondary | ICD-10-CM

## 2011-07-19 DIAGNOSIS — D518 Other vitamin B12 deficiency anemias: Secondary | ICD-10-CM

## 2011-07-19 MED ORDER — CYANOCOBALAMIN 1000 MCG/ML IJ SOLN
1000.0000 ug | Freq: Once | INTRAMUSCULAR | Status: AC
  Administered 2011-07-19: 1000 ug via INTRAMUSCULAR

## 2011-08-22 ENCOUNTER — Ambulatory Visit (INDEPENDENT_AMBULATORY_CARE_PROVIDER_SITE_OTHER): Payer: Medicare Other | Admitting: *Deleted

## 2011-08-22 DIAGNOSIS — E538 Deficiency of other specified B group vitamins: Secondary | ICD-10-CM

## 2011-08-22 MED ORDER — CYANOCOBALAMIN 1000 MCG/ML IJ SOLN
1000.0000 ug | Freq: Once | INTRAMUSCULAR | Status: AC
  Administered 2011-08-22: 1000 ug via INTRAMUSCULAR

## 2011-09-25 ENCOUNTER — Ambulatory Visit (INDEPENDENT_AMBULATORY_CARE_PROVIDER_SITE_OTHER): Payer: Medicare Other

## 2011-09-25 DIAGNOSIS — N138 Other obstructive and reflux uropathy: Secondary | ICD-10-CM | POA: Insufficient documentation

## 2011-09-25 DIAGNOSIS — D518 Other vitamin B12 deficiency anemias: Secondary | ICD-10-CM

## 2011-09-25 DIAGNOSIS — D519 Vitamin B12 deficiency anemia, unspecified: Secondary | ICD-10-CM

## 2011-09-25 MED ORDER — CYANOCOBALAMIN 1000 MCG/ML IJ SOLN
1000.0000 ug | Freq: Once | INTRAMUSCULAR | Status: AC
  Administered 2011-09-25: 1000 ug via INTRAMUSCULAR

## 2011-10-03 ENCOUNTER — Other Ambulatory Visit: Payer: Self-pay | Admitting: *Deleted

## 2011-10-03 MED ORDER — SIMVASTATIN 20 MG PO TABS: 20.0000 mg | ORAL_TABLET | Freq: Every day | ORAL | Status: DC

## 2011-10-03 NOTE — Telephone Encounter (Signed)
Rx sent 

## 2011-10-24 ENCOUNTER — Ambulatory Visit (INDEPENDENT_AMBULATORY_CARE_PROVIDER_SITE_OTHER): Payer: Medicare Other | Admitting: *Deleted

## 2011-10-24 DIAGNOSIS — E538 Deficiency of other specified B group vitamins: Secondary | ICD-10-CM

## 2011-10-24 MED ORDER — CYANOCOBALAMIN 1000 MCG/ML IJ SOLN
1000.0000 ug | Freq: Once | INTRAMUSCULAR | Status: AC
  Administered 2011-10-24: 1000 ug via INTRAMUSCULAR

## 2011-11-19 ENCOUNTER — Encounter: Payer: Self-pay | Admitting: Internal Medicine

## 2011-11-19 ENCOUNTER — Ambulatory Visit (INDEPENDENT_AMBULATORY_CARE_PROVIDER_SITE_OTHER): Payer: Medicare Other | Admitting: Internal Medicine

## 2011-11-19 VITALS — BP 122/80 | HR 77 | Temp 98.0°F | Wt 183.0 lb

## 2011-11-19 DIAGNOSIS — J029 Acute pharyngitis, unspecified: Secondary | ICD-10-CM

## 2011-11-19 DIAGNOSIS — D519 Vitamin B12 deficiency anemia, unspecified: Secondary | ICD-10-CM

## 2011-11-19 DIAGNOSIS — D518 Other vitamin B12 deficiency anemias: Secondary | ICD-10-CM

## 2011-11-19 LAB — POCT RAPID STREP A (OFFICE): Rapid Strep A Screen: NEGATIVE

## 2011-11-19 MED ORDER — CYANOCOBALAMIN 1000 MCG/ML IJ SOLN
1000.0000 ug | Freq: Once | INTRAMUSCULAR | Status: AC
  Administered 2011-11-19: 1000 ug via INTRAMUSCULAR

## 2011-11-19 NOTE — Progress Notes (Signed)
  Subjective:    Patient ID: Justin [name], male    DOB: [birth year], 78 y.o.   MRN: [id number]  HPI On 11/17/11 he developed a sore throat. He has been mowing and has had some sneezing but no itchy watery eyes. He has no known infectious exposures.  He is not taking any medication for this.    Review of Systems The major and minor symptoms of rhinosinusitis were reviewed. Hedenies nasal congestion/obstruction; nasal purulence; facial pain; anosmia; fatigue; fever; headache; halitosis; earache and dental pain.    Objective:   Physical Exam General appearance:good health ;well nourished; no acute distress or increased work of breathing is present.  No  lymphadenopathy about the head, neck, or axilla noted.  Appears younger than stated age   Eyes: No conjunctival inflammation or lid edema is present.   Ears:  External ear exam shows no significant lesions or deformities.  Otoscopic examination reveals clear canals, tympanic membranes are intact bilaterally without bulging, retraction, inflammation or discharge.  Nose:  External nasal examination shows no deformity or inflammation. Nasal mucosa are pink and moist without lesions or exudates. No septal dislocation or deviation.No obstruction to airflow.   Oral exam: Dental hygiene is good; lips and gums are healthy appearing.There is minimal  oropharyngeal erythema  ; no  exudate noted.   Neck:  No deformities, thyromegaly, masses, or tenderness noted.   Supple with full range of motion without pain.   Heart:  Normal rate and regular rhythm. S1 and S2 normal without gallop, murmur, click, rub or other extra sounds.   Lungs:Chest clear to auscultation; no wheezes, rhonchi,rales ,or rubs present.No increased work of breathing.    Extremities:  No cyanosis, edema  Skin: Warm & dry w/o jaundice . 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 He states that this is monitored by his dermatologist          Assessment & Plan:   #1 pharyngitis with negative beta strep. Probable viral etiology

## 2011-11-19 NOTE — Patient Instructions (Signed)
Zicam Melts or Zinc lozenges ; vitamin C 2000 mg daily; & Echinacea for 4-7 days. Report fever, exudate("pus") or progressive pain.  Use  Aveeno Daily  Moisturizing Lotion  twice a day  for the cheek lesion. Bathe with moisturizing liquid soap , not bar soap.  Nasal cleansing in the shower as discussed. Make sure that all residual soap is removed to prevent irritation.  Wear a mask when mowing.

## 2011-12-03 ENCOUNTER — Ambulatory Visit (INDEPENDENT_AMBULATORY_CARE_PROVIDER_SITE_OTHER): Payer: Medicare Other | Admitting: Family Medicine

## 2011-12-03 ENCOUNTER — Encounter: Payer: Self-pay | Admitting: Family Medicine

## 2011-12-03 VITALS — BP 122/76 | HR 78 | Temp 98.3°F | Wt 181.8 lb

## 2011-12-03 DIAGNOSIS — J40 Bronchitis, not specified as acute or chronic: Secondary | ICD-10-CM

## 2011-12-03 DIAGNOSIS — J329 Chronic sinusitis, unspecified: Secondary | ICD-10-CM

## 2011-12-03 MED ORDER — AMOXICILLIN-POT CLAVULANATE 875-125 MG PO TABS: 1.0000 | ORAL_TABLET | Freq: Two times a day (BID) | ORAL | Status: AC

## 2011-12-03 NOTE — Progress Notes (Signed)
  Subjective:     Justin [name] is a 78 y.o. male here for evaluation of a cough. Onset of symptoms was 3 weeks ago. Symptoms have been gradually worsening since that time. The cough is productive and is aggravated by exercise and reclining position. Associated symptoms include: chills, shortness of breath, sputum production and wheezing. Patient does not have a history of asthma. Patient does not have a history of environmental allergens. Patient has not traveled recently. Patient does have a history of smoking. Patient has not had a previous chest x-ray. Patient has not had a PPD done.  The following portions of the patient's history were reviewed and updated as appropriate: allergies, current medications, past family history, past medical history, past social history, past surgical history and problem list.  Review of Systems Pertinent items are noted in HPI.    Objective:    Oxygen saturation 98% on room air BP 122/76  Pulse 78  Temp(Src) 98.3 F (36.8 C) (Oral)  Wt 181 lb 12.8 oz (82.464 kg)  SpO2 98% General appearance: alert, cooperative, appears stated age and no distress Ears: normal TM's and external ear canals both ears Nose: green discharge, moderate congestion, turbinates red, swollen, sinus tenderness bilateral Throat: abnormal findings: mild oropharyngeal erythema and pnd Neck: moderate anterior cervical adenopathy, supple, symmetrical, trachea midline and thyroid not enlarged, symmetric, no tenderness/mass/nodules Lungs: rhonchi bilaterally Heart: S1, S2 normal Extremities: extremities normal, atraumatic, no cyanosis or edema    Assessment:    Acute Bronchitis and Sinusitis    Plan:    Antibiotics per medication orders. Avoid exposure to tobacco smoke and fumes. Call if shortness of breath worsens, blood in sputum, change in character of cough, development of fever or chills, inability to maintain nutrition and hydration. Avoid exposure to tobacco smoke and  fumes. cont otc cough med  Pt did not want nasal spray

## 2011-12-03 NOTE — Patient Instructions (Signed)

## 2011-12-10 ENCOUNTER — Telehealth: Payer: Self-pay | Admitting: Internal Medicine

## 2011-12-10 MED ORDER — MOXIFLOXACIN HCL 400 MG PO TABS: 400.0000 mg | ORAL_TABLET | Freq: Every day | ORAL | Status: AC

## 2011-12-10 NOTE — Telephone Encounter (Signed)
We can change the abx first to see if it works better----avelox 400 mg 1 po qd for 7 days If cough is still productive--- check cxr and then if he starts new med tonight we can see him the end of the week if he is no better or he can just come in ----he does not have to finish augmentin before coming in.

## 2011-12-10 NOTE — Telephone Encounter (Signed)
Spoke with patient and he will go ahead and stop the augmentin and start the Avelox, he will cal in a few days if not better for the CXR.    KP

## 2011-12-10 NOTE — Telephone Encounter (Signed)
Please advise      KP 

## 2011-12-24 ENCOUNTER — Ambulatory Visit: Payer: Medicare Other

## 2011-12-31 ENCOUNTER — Ambulatory Visit (INDEPENDENT_AMBULATORY_CARE_PROVIDER_SITE_OTHER): Payer: Medicare Other | Admitting: Internal Medicine

## 2011-12-31 VITALS — BP 130/68 | HR 77 | Temp 98.0°F | Wt 177.0 lb

## 2011-12-31 DIAGNOSIS — E538 Deficiency of other specified B group vitamins: Secondary | ICD-10-CM | POA: Insufficient documentation

## 2011-12-31 NOTE — Patient Instructions (Signed)
Please  schedule fasting Labs before next B12 shot : BMET, hepatic panel, CBC & dif, TSH, B12 level. PLEASE BRING THESE INSTRUCTIONS TO FOLLOW UP  LAB APPOINTMENT.This will guarantee correct labs are drawn, eliminating need for repeat blood sampling ( needle sticks ! ). Diagnoses /Codes:780.79,281.0

## 2011-12-31 NOTE — Progress Notes (Signed)
Subjective:    Patient ID: Justin [name], male    DOB: [birth year], 78 y.o.   MRN: [id number]  HPI  There was slow resolution of his bronchitic symptoms; he has significant response of his cough to Robitussin. He also was having night sweats but these have also resolved. Fatigue accompanied bronchitic episode but it is also improving.  He continues to receive monthly B12 shots;  B12 level has not been checked in the recent past.      Review of Systems He was intolerant to Inderal (propranolol) for his tremor as  it caused a typical frontal headaches.                                                                                      Vision changes ( blurred/ double/ loss): no                                                                                                 Hoarseness or swallowing dysfunction: no                                                                                        Bowel changes( constipation/ diarrhea): constipation 6/9 only                                                                                     Weight change: down 2 #  Exertional chest pain: no  Dyspnea on exertion:no Leg swelling:no PND:no Melena/ rectal bleeding: no Adenopathy:no Severe snoring: no  Skin / hair / nail changes: no Temperature intolerance( heat/ cold) : cold intolerance on Plavix  Altered appetite:poor X 1 week  Poor sleep/ Apnea : no Abnormal bruising / bleeding or enlarged lymph nodes: bruising on Plavix                                                                         PMH/ FH of thyroid disease: son        Objective:   Physical Exam Gen.: Healthy and well-nourished in appearance. Alert, appropriate and cooperative throughout exam. Appears younger than stated age  Eyes:  Extraocular motion intact. Vision grossly normal.  Neck: No deformities, masses, or  tenderness noted.  Thyroid normal. Lungs: Normal respiratory effort; chest expands symmetrically. Lungs are clear to auscultation without rales, wheezes, or increased work of breathing. Heart: Normal rate and rhythm. Normal S1 and S2. No gallop, click, or rub. S4 with slurring at  LSB  Abdomen: Bowel sounds normal; abdomen soft and nontender. No masses, organomegaly or hernias noted.                                                                   Musculoskeletal/extremities:  No clubbing, cyanosis, edema, or deformity noted. Range of motion  normal .Tone & strength  normal.Joints normal. Nail health  good. Vascular: Carotid, radial artery, dorsalis pedis and  posterior tibial pulses are full and equal. No bruits present. Neurologic: Alert and oriented x3. Deep tendon reflexes symmetrical and normal.          Skin: Intact without suspicious lesions or rashes. Lymph: No cervical, axillary lymphadenopathy present. Psych: Mood and affect are normal. Normally interactive                                                                                        Assessment & Plan:  #1 cough, resolved  #2 fatigue, improved  #3 pernicious anemia, he is receiving monthly B12 shots  Plan: Labs will be collected prior to his next B12 shot to ascertain the need for continuing the shots and to evaluate the fatigue.

## 2012-01-11 ENCOUNTER — Other Ambulatory Visit: Payer: Self-pay | Admitting: Internal Medicine

## 2012-01-21 ENCOUNTER — Other Ambulatory Visit (INDEPENDENT_AMBULATORY_CARE_PROVIDER_SITE_OTHER): Payer: Medicare Other

## 2012-01-21 DIAGNOSIS — Z0000 Encounter for general adult medical examination without abnormal findings: Secondary | ICD-10-CM

## 2012-01-21 DIAGNOSIS — Z79899 Other long term (current) drug therapy: Secondary | ICD-10-CM

## 2012-01-21 DIAGNOSIS — E538 Deficiency of other specified B group vitamins: Secondary | ICD-10-CM

## 2012-01-21 LAB — CBC WITH DIFFERENTIAL/PLATELET
Basophils Absolute: 0 10*3/uL (ref 0.0–0.1)
Basophils Relative: 0.8 % (ref 0.0–3.0)
Eosinophils Absolute: 0.1 10*3/uL (ref 0.0–0.7)
Eosinophils Relative: 2.8 % (ref 0.0–5.0)
HCT: 36.7 % — ABNORMAL LOW (ref 39.0–52.0)
Hemoglobin: 12.6 g/dL — ABNORMAL LOW (ref 13.0–17.0)
Lymphocytes Relative: 20 % (ref 12.0–46.0)
Lymphs Abs: 0.9 10*3/uL (ref 0.7–4.0)
MCHC: 34.3 g/dL (ref 30.0–36.0)
MCV: 97.6 fl (ref 78.0–100.0)
Monocytes Absolute: 0.6 10*3/uL (ref 0.1–1.0)
Monocytes Relative: 12.4 % — ABNORMAL HIGH (ref 3.0–12.0)
Neutro Abs: 3 10*3/uL (ref 1.4–7.7)
Neutrophils Relative %: 64 % (ref 43.0–77.0)
Platelets: 178 10*3/uL (ref 150.0–400.0)
RBC: 3.76 Mil/uL — ABNORMAL LOW (ref 4.22–5.81)
RDW: 13.6 % (ref 11.5–14.6)
WBC: 4.6 10*3/uL (ref 4.5–10.5)

## 2012-01-21 LAB — VITAMIN B12: Vitamin B-12: 404 pg/mL (ref 211–911)

## 2012-01-21 LAB — HEPATIC FUNCTION PANEL
ALT: 15 U/L (ref 0–53)
AST: 18 U/L (ref 0–37)
Albumin: 3.7 g/dL (ref 3.5–5.2)
Alkaline Phosphatase: 64 U/L (ref 39–117)
Bilirubin, Direct: 0 mg/dL (ref 0.0–0.3)
Total Bilirubin: 0.5 mg/dL (ref 0.3–1.2)
Total Protein: 7 g/dL (ref 6.0–8.3)

## 2012-01-21 LAB — BASIC METABOLIC PANEL
BUN: 20 mg/dL (ref 6–23)
CO2: 29 mEq/L (ref 19–32)
Calcium: 8.9 mg/dL (ref 8.4–10.5)
Chloride: 105 mEq/L (ref 96–112)
Creatinine, Ser: 0.8 mg/dL (ref 0.4–1.5)
GFR: 103.7 mL/min (ref >60.00)
Glucose, Bld: 96 mg/dL (ref 70–99)
Potassium: 3.7 mEq/L (ref 3.5–5.1)
Sodium: 140 mEq/L (ref 135–145)

## 2012-01-21 MED ORDER — CYANOCOBALAMIN 1000 MCG/ML IJ SOLN
1000.0000 ug | Freq: Once | INTRAMUSCULAR | Status: AC
  Administered 2012-01-21: 1000 ug via INTRAMUSCULAR

## 2012-01-21 NOTE — Progress Notes (Signed)
Labs only

## 2012-01-22 LAB — TSH: TSH: 3.29 u[IU]/mL (ref 0.35–5.50)

## 2012-01-23 ENCOUNTER — Other Ambulatory Visit: Payer: Medicare Other

## 2012-02-04 ENCOUNTER — Other Ambulatory Visit (INDEPENDENT_AMBULATORY_CARE_PROVIDER_SITE_OTHER): Payer: Medicare Other

## 2012-02-04 DIAGNOSIS — R5383 Other fatigue: Secondary | ICD-10-CM

## 2012-02-04 DIAGNOSIS — R5381 Other malaise: Secondary | ICD-10-CM

## 2012-02-04 DIAGNOSIS — D510 Vitamin B12 deficiency anemia due to intrinsic factor deficiency: Secondary | ICD-10-CM

## 2012-02-04 LAB — CBC WITH DIFFERENTIAL/PLATELET
Basophils Absolute: 0 10*3/uL (ref 0.0–0.1)
Basophils Relative: 0.9 % (ref 0.0–3.0)
Eosinophils Absolute: 0.1 10*3/uL (ref 0.0–0.7)
Eosinophils Relative: 2.5 % (ref 0.0–5.0)
HCT: 36.6 % — ABNORMAL LOW (ref 39.0–52.0)
Hemoglobin: 12.4 g/dL — ABNORMAL LOW (ref 13.0–17.0)
Lymphocytes Relative: 21.2 % (ref 12.0–46.0)
Lymphs Abs: 1 10*3/uL (ref 0.7–4.0)
MCHC: 34 g/dL (ref 30.0–36.0)
MCV: 97.7 fl (ref 78.0–100.0)
Monocytes Absolute: 0.5 10*3/uL (ref 0.1–1.0)
Monocytes Relative: 11.4 % (ref 3.0–12.0)
Neutro Abs: 2.9 10*3/uL (ref 1.4–7.7)
Neutrophils Relative %: 64 % (ref 43.0–77.0)
Platelets: 176 10*3/uL (ref 150.0–400.0)
RBC: 3.75 Mil/uL — ABNORMAL LOW (ref 4.22–5.81)
RDW: 13.6 % (ref 11.5–14.6)
WBC: 4.5 10*3/uL (ref 4.5–10.5)

## 2012-02-04 LAB — HEPATIC FUNCTION PANEL
ALT: 17 U/L (ref 0–53)
AST: 20 U/L (ref 0–37)
Albumin: 3.6 g/dL (ref 3.5–5.2)
Alkaline Phosphatase: 61 U/L (ref 39–117)
Bilirubin, Direct: 0 mg/dL (ref 0.0–0.3)
Total Bilirubin: 0.7 mg/dL (ref 0.3–1.2)
Total Protein: 6.6 g/dL (ref 6.0–8.3)

## 2012-02-04 LAB — VITAMIN B12: Vitamin B-12: 473 pg/mL (ref 211–911)

## 2012-02-07 ENCOUNTER — Other Ambulatory Visit (INDEPENDENT_AMBULATORY_CARE_PROVIDER_SITE_OTHER): Payer: Medicare Other

## 2012-02-07 DIAGNOSIS — Z1289 Encounter for screening for malignant neoplasm of other sites: Secondary | ICD-10-CM

## 2012-02-07 LAB — POC HEMOCCULT BLD/STL (OFFICE/1-CARD/DIAGNOSTIC): Fecal Occult Blood, POC: NEGATIVE

## 2012-02-21 ENCOUNTER — Ambulatory Visit (INDEPENDENT_AMBULATORY_CARE_PROVIDER_SITE_OTHER): Payer: Medicare Other | Admitting: *Deleted

## 2012-02-21 DIAGNOSIS — E538 Deficiency of other specified B group vitamins: Secondary | ICD-10-CM

## 2012-02-21 MED ORDER — CYANOCOBALAMIN 1000 MCG/ML IJ SOLN
1000.0000 ug | Freq: Once | INTRAMUSCULAR | Status: AC
  Administered 2012-02-21: 1000 ug via INTRAMUSCULAR

## 2012-03-25 ENCOUNTER — Ambulatory Visit (INDEPENDENT_AMBULATORY_CARE_PROVIDER_SITE_OTHER): Payer: Medicare Other | Admitting: *Deleted

## 2012-03-25 DIAGNOSIS — E538 Deficiency of other specified B group vitamins: Secondary | ICD-10-CM

## 2012-03-25 MED ORDER — CYANOCOBALAMIN 1000 MCG/ML IJ SOLN
1000.0000 ug | Freq: Once | INTRAMUSCULAR | Status: AC
  Administered 2012-03-25: 1000 ug via INTRAMUSCULAR

## 2012-04-14 ENCOUNTER — Telehealth: Payer: Self-pay | Admitting: Internal Medicine

## 2012-04-14 NOTE — Telephone Encounter (Signed)
Refill: Clopidogrel 75mg . 1T, po, qd. #90.

## 2012-04-14 NOTE — Telephone Encounter (Signed)
Rxed by Dr Pearlean Brownie, Neurology

## 2012-04-14 NOTE — Telephone Encounter (Signed)
Dr.Hopper please advise for it is noted that this was rx'ed by Neurologist

## 2012-04-15 ENCOUNTER — Other Ambulatory Visit: Payer: Self-pay

## 2012-04-15 ENCOUNTER — Other Ambulatory Visit: Payer: Self-pay | Admitting: Internal Medicine

## 2012-04-15 MED ORDER — CLOPIDOGREL BISULFATE 75 MG PO TABS: 75.0000 mg | ORAL_TABLET | Freq: Every day | ORAL | Status: DC

## 2012-04-15 NOTE — Telephone Encounter (Signed)
per [facility] dr[facility] Pearlean Brownie stated pt has not been seen at his ofc in a yr Rx should be filled by PCP cb for pharmacy as they would like meds to be filled here if possible is [phone] Sue Lush will be in til 5pm today

## 2012-04-15 NOTE — Telephone Encounter (Signed)
I can  fill this medication long-term if the neurologist documents this is a lifelong med &does not require periodic reassessment. If it needs to be reassessed; I recommend annual neurology evaluation

## 2012-04-15 NOTE — Telephone Encounter (Signed)
I called the pharmacy and spoke with Tresa Endo, rx to be forwarded to Dr.Sethi

## 2012-04-15 NOTE — Telephone Encounter (Signed)
Rx has been [facility] to [facility] [address]      MW

## 2012-04-16 NOTE — Telephone Encounter (Signed)
Patient will see Neuro in near future

## 2012-04-24 ENCOUNTER — Ambulatory Visit (INDEPENDENT_AMBULATORY_CARE_PROVIDER_SITE_OTHER): Payer: Medicare Other | Admitting: *Deleted

## 2012-04-24 DIAGNOSIS — E538 Deficiency of other specified B group vitamins: Secondary | ICD-10-CM

## 2012-04-24 MED ORDER — CYANOCOBALAMIN 1000 MCG/ML IJ SOLN
1000.0000 ug | Freq: Once | INTRAMUSCULAR | Status: AC
  Administered 2012-04-24: 1000 ug via INTRAMUSCULAR

## 2012-05-23 ENCOUNTER — Ambulatory Visit (INDEPENDENT_AMBULATORY_CARE_PROVIDER_SITE_OTHER): Payer: Medicare Other

## 2012-05-23 DIAGNOSIS — Z23 Encounter for immunization: Secondary | ICD-10-CM

## 2012-05-23 DIAGNOSIS — E538 Deficiency of other specified B group vitamins: Secondary | ICD-10-CM

## 2012-05-23 MED ORDER — CYANOCOBALAMIN 1000 MCG/ML IJ SOLN: 1000.0000 ug | Freq: Once | INTRAMUSCULAR | Status: DC

## 2012-06-10 ENCOUNTER — Encounter: Payer: Self-pay | Admitting: Internal Medicine

## 2012-06-10 ENCOUNTER — Ambulatory Visit (INDEPENDENT_AMBULATORY_CARE_PROVIDER_SITE_OTHER): Payer: Medicare Other | Admitting: Internal Medicine

## 2012-06-10 VITALS — BP 114/66 | HR 72 | Temp 98.1°F | Resp 12 | Ht 70.5 in | Wt 181.6 lb

## 2012-06-10 DIAGNOSIS — Z86010 Personal history of colonic polyps: Secondary | ICD-10-CM

## 2012-06-10 DIAGNOSIS — E538 Deficiency of other specified B group vitamins: Secondary | ICD-10-CM

## 2012-06-10 DIAGNOSIS — E785 Hyperlipidemia, unspecified: Secondary | ICD-10-CM

## 2012-06-10 DIAGNOSIS — Z0000 Encounter for general adult medical examination without abnormal findings: Secondary | ICD-10-CM

## 2012-06-10 LAB — CBC WITH DIFFERENTIAL/PLATELET
Basophils Absolute: 0 10*3/uL (ref 0.0–0.1)
Basophils Relative: 0.7 % (ref 0.0–3.0)
Eosinophils Absolute: 0.1 10*3/uL (ref 0.0–0.7)
Eosinophils Relative: 1.7 % (ref 0.0–5.0)
HCT: 38.7 % — ABNORMAL LOW (ref 39.0–52.0)
Hemoglobin: 13 g/dL (ref 13.0–17.0)
Lymphocytes Relative: 22.6 % (ref 12.0–46.0)
Lymphs Abs: 1.2 10*3/uL (ref 0.7–4.0)
MCHC: 33.5 g/dL (ref 30.0–36.0)
MCV: 96 fl (ref 78.0–100.0)
Monocytes Absolute: 0.7 10*3/uL (ref 0.1–1.0)
Monocytes Relative: 13.1 % — ABNORMAL HIGH (ref 3.0–12.0)
Neutro Abs: 3.4 10*3/uL (ref 1.4–7.7)
Neutrophils Relative %: 61.9 % (ref 43.0–77.0)
Platelets: 189 10*3/uL (ref 150.0–400.0)
RBC: 4.03 Mil/uL — ABNORMAL LOW (ref 4.22–5.81)
RDW: 13.3 % (ref 11.5–14.6)
WBC: 5.4 10*3/uL (ref 4.5–10.5)

## 2012-06-10 LAB — LIPID PANEL
Cholesterol: 135 mg/dL (ref 0–200)
HDL: 52.8 mg/dL (ref >39.00)
LDL Cholesterol: 55 mg/dL (ref 0–99)
Total CHOL/HDL Ratio: 3
Triglycerides: 137 mg/dL (ref 0.0–149.0)
VLDL: 27.4 mg/dL (ref 0.0–40.0)

## 2012-06-10 LAB — AST: AST: 19 U/L (ref 0–37)

## 2012-06-10 LAB — VITAMIN B12: Vitamin B-12: 414 pg/mL (ref 211–911)

## 2012-06-10 LAB — ALT: ALT: 18 U/L (ref 0–53)

## 2012-06-10 NOTE — Patient Instructions (Addendum)
Preventive Health Care: Exercise at least 30-45 minutes a day,  3-4 days a week.  Eat a low-fat diet with lots of fruits and vegetables, up to 7-9 servings per day.  Consume less than 40 grams of sugar per day from foods & drinks with High Fructose Corn Sugar as # 1,2,3 or # 4 on label. Health Care Power of Attorney & Living Will. Complete if not in place ; these place you in charge of your health care decisions. Review and correct the record as indicated. Please share record with all medical staff seen.

## 2012-06-10 NOTE — Progress Notes (Signed)
Subjective:    Patient ID: Justin [name], male    DOB: 06/06/1925, 78 y.o.   MRN: [id number]  HPI Medicare Wellness Visit:  The following psychosocial & medical history were reviewed as required by Medicare.   Social history: caffeine: 2 cups/ day , alcohol: no ,  tobacco use : quit 1978  & exercise : physically active.   Home & personal  safety / fall risk: no issues, activities of daily living: no limitations , seatbelt use : yes , and smoke alarm employment : yes .  Power of Attorney/Living Will status : NO  Vision ( as recorded per Nurse) & Hearing  evaluation : Ophth exam 2/13; no hearing exam. Orientation :oriented X 3 , memory & recall :good,  math testing good:,and mood & affect : normal . Depression / anxiety: denied Travel history :last in 45 to [place]  , immunization status : Shingles needed , transfusion history: no, and preventive health surveillance ( colonoscopies, BMD , etc as per protocol/ SOC): ? Colonoscopy status, Dental care: every 6 mos . Chart reviewed &  Updated. Active issues reviewed & addressed.       Review of Systems Dyslipidemia assessment: Family history of premature CAD/ MI: no .  Nutrition: no specific diet .  Medication compliance: yes HTN: no   Weight :  Stable now , but 8 # loss over past year . ROS: fatigue: no ; chest pain : no ;claudication: no; palpitations: no; abd pain/bowel changes: no ; myalgias:no but hand cramping @ night holding paper;  syncope : no ; memory loss: no;skin changes: no.      Objective:   Physical Exam Gen.: Appears younger than stated age ; well-nourished in appearance. Alert, appropriate and cooperative throughout exam. Head: Normocephalic without obvious abnormalities;  pattern alopecia  Eyes: No corneal or conjunctival inflammation noted. Arcus senilis. Extraocular motion intact. Vision grossly normal. Ears: External  ear exam reveals no significant lesions or deformities. Canals blocked with wax. Hearing is  grossly decreased bilaterally. Nose: External nasal exam reveals no deformity or inflammation. Nasal mucosa are pink and moist. No lesions or exudates noted. Septum  To L Mouth: Oral mucosa and oropharynx reveal no lesions or exudates. Teeth in good repair. Neck: No deformities, masses, or tenderness noted. Range of motion &. Thyroid normal. Lungs: Normal respiratory effort; chest expands symmetrically. Lungs are clear to auscultation without rales, wheezes, or increased work of breathing. Heart: Normal rate and rhythm. Normal S1 and S2. No gallop, click, or rub. S4 w/o murmur. Abdomen: Bowel sounds normal; abdomen soft and nontender. No masses, organomegaly or hernias noted. Genitalia: Dr Logan Bores, Urology                                                        Musculoskeletal/extremities: There is some asymmetry of the posterior thoracic musculature suggesting occult scoliosis.  No clubbing, cyanosis, edema, or deformity noted. Range of motion  normal .Tone & strength  normal.Joints normal. Toenails thickened Vascular: Carotid, radial artery, dorsalis pedis and  posterior tibial pulses are full and equal. No bruits present. Neurologic: Alert and oriented x3. Deep tendon reflexes symmetrical and normal.          Skin: Intact without suspicious lesions or rashes.Scattered ecchymoses Lymph: No cervical, axillary lymphadenopathy present. Psych: Mood and affect are normal. Normally  interactive                                                                                         Assessment & Plan:  #1 Medicare Wellness Exam; criteria met ; data entered #2 Problem List reviewed ; Assessment/ Recommendations made Plan: see Orders

## 2012-07-20 ENCOUNTER — Other Ambulatory Visit: Payer: Self-pay | Admitting: Internal Medicine

## 2012-07-21 NOTE — Telephone Encounter (Signed)
Refill done.  

## 2012-07-24 ENCOUNTER — Ambulatory Visit (INDEPENDENT_AMBULATORY_CARE_PROVIDER_SITE_OTHER): Payer: BC Managed Care – PPO

## 2012-07-24 DIAGNOSIS — E538 Deficiency of other specified B group vitamins: Secondary | ICD-10-CM

## 2012-07-24 MED ORDER — CYANOCOBALAMIN 1000 MCG/ML IJ SOLN: 1000.0000 ug | Freq: Once | INTRAMUSCULAR | Status: DC

## 2012-08-25 ENCOUNTER — Ambulatory Visit (INDEPENDENT_AMBULATORY_CARE_PROVIDER_SITE_OTHER): Payer: Medicare Other | Admitting: *Deleted

## 2012-08-25 DIAGNOSIS — E538 Deficiency of other specified B group vitamins: Secondary | ICD-10-CM

## 2012-08-25 MED ORDER — CYANOCOBALAMIN 1000 MCG/ML IJ SOLN
1000.0000 ug | Freq: Once | INTRAMUSCULAR | Status: AC
  Administered 2012-08-25: 1000 ug via INTRAMUSCULAR

## 2012-09-24 ENCOUNTER — Ambulatory Visit (INDEPENDENT_AMBULATORY_CARE_PROVIDER_SITE_OTHER): Payer: Medicare Other

## 2012-09-24 DIAGNOSIS — D518 Other vitamin B12 deficiency anemias: Secondary | ICD-10-CM

## 2012-09-24 DIAGNOSIS — D519 Vitamin B12 deficiency anemia, unspecified: Secondary | ICD-10-CM

## 2012-09-24 MED ORDER — CYANOCOBALAMIN 1000 MCG/ML IJ SOLN
1000.0000 ug | Freq: Once | INTRAMUSCULAR | Status: AC
  Administered 2012-09-24: 1000 ug via INTRAMUSCULAR

## 2012-10-22 ENCOUNTER — Ambulatory Visit: Payer: Medicare Other

## 2012-11-12 ENCOUNTER — Ambulatory Visit (INDEPENDENT_AMBULATORY_CARE_PROVIDER_SITE_OTHER): Payer: Medicare Other | Admitting: *Deleted

## 2012-11-12 DIAGNOSIS — E538 Deficiency of other specified B group vitamins: Secondary | ICD-10-CM

## 2012-11-12 MED ORDER — CYANOCOBALAMIN 1000 MCG/ML IJ SOLN
1000.0000 ug | Freq: Once | INTRAMUSCULAR | Status: AC
  Administered 2012-11-12: 1000 ug via INTRAMUSCULAR

## 2012-12-03 ENCOUNTER — Ambulatory Visit (INDEPENDENT_AMBULATORY_CARE_PROVIDER_SITE_OTHER): Payer: Medicare Other | Admitting: Family Medicine

## 2012-12-03 ENCOUNTER — Encounter: Payer: Self-pay | Admitting: Family Medicine

## 2012-12-03 VITALS — BP 130/68 | HR 64 | Temp 98.2°F | Wt 181.4 lb

## 2012-12-03 DIAGNOSIS — S90569A Insect bite (nonvenomous), unspecified ankle, initial encounter: Secondary | ICD-10-CM

## 2012-12-03 DIAGNOSIS — Z23 Encounter for immunization: Secondary | ICD-10-CM

## 2012-12-03 DIAGNOSIS — S70362A Insect bite (nonvenomous), left thigh, initial encounter: Secondary | ICD-10-CM

## 2012-12-03 DIAGNOSIS — L089 Local infection of the skin and subcutaneous tissue, unspecified: Secondary | ICD-10-CM | POA: Insufficient documentation

## 2012-12-03 MED ORDER — DOXYCYCLINE HYCLATE 100 MG PO TABS: 100.0000 mg | ORAL_TABLET | Freq: Two times a day (BID) | ORAL | Status: DC

## 2012-12-03 NOTE — Assessment & Plan Note (Signed)
Doxy sent to pharmacy If any symptoms develop labs will be done at that time F/u pcp prn

## 2012-12-03 NOTE — Patient Instructions (Addendum)
Deer Tick Bite Deer ticks are brown arachnids (spider family) that vary in size from as small as the head of a pin to 1/4 inch (1/2 cm) diameter. They thrive in wooded areas. Deer are the preferred host of adult deer ticks. Small rodents are the host of young ticks (nymphs). When a person walks in a field or wooded area, young and adult ticks in the surrounding grass and vegetation can attach themselves to the skin. They can suck blood for hours to days if unnoticed. Ticks are found all over the [place] Some ticks carry a specific bacteria (Borrelia burgdorferi) that causes an infection called Lyme disease. The bacteria is typically passed into a person during the blood sucking process. This happens after the tick has been attached for at least a number of hours. While ticks can be found all over the [place] those carrying the bacteria that causes Lyme disease are most common in [place] and the [place]. Only a small proportion of ticks in these areas carry the Lyme disease bacteria and cause human infections. Ticks usually attach to warm spots on the body, such as the:  Head.  Back.  Neck.  Armpits.  Groin. SYMPTOMS  Most of the time, a deer tick bite will not be felt. You may or may not see the attached tick. You may notice mild irritation or redness around the bite site. If the deer tick passes the Lyme disease bacteria to a person, a round, red rash may be noticed 2 to 3 days after the bite. The rash may be clear in the middle, like a bull's-eye or target. If not treated, other symptoms may develop several days to weeks after the onset of the rash. These symptoms may include:  New rash lesions.  Fatigue and weakness.  General ill feeling and achiness.  Chills.  Headache and neck pain.  Swollen lymph glands.  Sore muscles and joints. 5 to 15% of untreated people with Lyme disease may develop more severe illnesses after several weeks to months. This may include inflammation of the  brain lining (meningitis), nerve palsies, an abnormal heartbeat, or severe muscle and joint pain and inflammation (myositis or arthritis). DIAGNOSIS   Physical exam and medical history.  Viewing the tick if it was saved for confirmation.  Blood tests (to check or confirm the presence of Lyme disease). TREATMENT  Most ticks do not carry disease. If found, an attached tick should be removed using tweezers. Tweezers should be placed under the body of the tick so it is removed by its attachment parts (pincers). If there are signs or symptoms of being sick, or Lyme disease is confirmed, medicines (antibiotics) that kill germs are usually prescribed. In more severe cases, antibiotics may be given through an intravenous (IV) access. HOME CARE INSTRUCTIONS   Always remove ticks with tweezers. Do not use petroleum jelly or other methods to kill or remove the tick. Slide the tweezers under the body and pull out as much as you can. If you are not sure what it is, save it in a jar and show your caregiver.  Once you remove the tick, the skin will heal on its own. Wash your hands and the affected area with water and soap. You may place a bandage on the affected area.  Take medicine as directed. You may be advised to take a full course of antibiotics.  Follow up with your caregiver as recommended. FINDING OUT THE RESULTS OF YOUR TEST Not all test results are available   during your visit. If your test results are not back during the visit, make an appointment with your caregiver to find out the results. Do not assume everything is normal if you have not heard from your caregiver or the medical facility. It is important for you to follow up on all of your test results. PROGNOSIS  If Lyme disease is confirmed, early treatment with antibiotics is very effective. Following preventive guidelines is important since it is possible to get the disease more than once. PREVENTION   Wear long sleeves and long pants in  wooded or grassy areas. Tuck your pants into your socks.  Use an insect repellent while hiking.  Check yourself, your children, and your pets regularly for ticks after playing outside.  Clear piles of leaves or brush from your yard. Ticks might live there. SEEK MEDICAL CARE IF:   You or your child has an oral temperature above 102 F (38.9 C).  You develop a severe headache following the bite.  You feel generally ill.  You notice a rash.  You are having trouble removing the tick.  The bite area has red skin or yellow drainage. SEEK IMMEDIATE MEDICAL CARE IF:   Your face is weak and droopy or you have other neurological symptoms.  You have severe joint pain or weakness. MAKE SURE YOU:   Understand these instructions.  Will watch your condition.  Will get help right away if you are not doing well or get worse. FOR MORE INFORMATION Centers for Disease Control and Prevention: FootballExhibition.com.br American Academy of [employer]: www.https://powers.com/ Document Released: 10/03/2009 Document Revised: 10/01/2011 Document Reviewed: 10/03/2009 [facility] Patient Information 2013 [place], [place]. Wood Tick Bite Ticks are insects that attach themselves to the skin. Most tick bites are harmless, but sometimes ticks carry diseases that can make a person quite ill. The chance of getting ill depends on:  The kind of tick that bites you.  Time of year.  How long the tick is attached.  Geographic location. Wood ticks are also called dog ticks. They are generally black. They can have white markings. They live in shrubs and grassy areas. They are larger than deer ticks. Wood ticks are about the size of a watermelon seed. They have a hard body. The most common places for ticks to attach themselves are the scalp, neck, armpits, waist, and groin. Wood ticks may stay attached for up to 2 weeks. TICKS MUST BE REMOVED AS SOON AS POSSIBLE TO HELP PREVENT DISEASES CAUSED BY TICK BITES.  TO REMOVE A  TICK: 1. If available, put on latex gloves before trying to remove a tick. 2. Grasp the tick as close to the skin as possible, with curved forceps, fine tweezers or a special tick removal tool. 3. Pull gently with steady pressure until the tick lets go. Do not twist the tick or jerk it suddenly. This may break off the tick's head or mouth parts. 4. Do not crush the tick's body. This could force disease-carrying fluids from the tick into your body. 5. After the tick is removed, wash the bite area and your hands with soap and water or other disinfectant. 6. Apply a small amount of antiseptic cream or ointment to the bite site. 7. Wash and disinfect any instruments that were used. 8. Save the tick in a jar or plastic bag for later identification. Preserve the tick with a bit of alcohol or put it in the freezer. 9. Do not apply a hot match, petroleum jelly, or fingernail polish  to the tick. This does not work and may increase the chances of disease from the tick bite. YOU MAY NEED TO SEE YOUR CAREGIVER FOR A TETANUS SHOT NOW IF:  You have no idea when you had the last one.  You have never had a tetanus shot before. If you need a tetanus shot, and you decide not to get one, there is a rare chance of getting tetanus. Sickness from tetanus can be serious. If you get a tetanus shot, your arm may swell, get red and warm to the touch at the shot site. This is common and not a problem. PREVENTION  Wear protective clothing. Long sleeves and pants are best.  Wear white clothes to see ticks more easily  Tuck your pant legs into your socks.  If walking on trail, stay in the middle of the trail to avoid brushing against bushes.  Put insect repellent on all exposed skin and along boot tops, pant legs and sleeve cuffs  Check clothing, hair and skin repeatedly and before coming inside.  Brush off any ticks that are not attached. SEEK MEDICAL CARE IF:   You cannot remove a tick or part of the tick that  is left in the skin.  Unexplained fever.  Redness and swelling in the area of the tick bite.  Tender, swollen lymph glands.  Diarrhea.  Weight loss.  Cough.  Fatigue.  Muscle, joint or bone pain.  Belly pain.  Headache.  Rash. SEEK IMMEDIATE MEDICAL CARE IF:   You develop an oral temperature above 102 F (38.9 C).  You are having trouble walking or moving your legs.  Numbness in the legs.  Shortness of breath.  Confusion.  Repeated vomiting. Document Released: 07/06/2000 Document Revised: 10/01/2011 Document Reviewed: 06/14/2008 [facility] Patient Information 2013 [place], [place].

## 2012-12-03 NOTE — Progress Notes (Signed)
  Subjective:    Patient ID: Justin [name], male    DOB: 07-09-1925, 79 y.o.   MRN: [id number]  HPI Pt here with his wife c/o tick bite x 3-4 days ago .  There removed tick from back of R thigh and area is red/  Pt with no other rash or fevers.     Review of Systems As above    Objective:   Physical Exam BP 130/68  Pulse 64  Temp(Src) 98.2 F (36.8 C) (Oral)  Wt 181 lb 6.4 oz (82.283 kg)  BMI 25.65 kg/m2  SpO2 98% General appearance: alert, cooperative, appears stated age and no distress Skin: + area of errythema where tick was removed about dime sized       Assessment & Plan:

## 2012-12-08 ENCOUNTER — Ambulatory Visit: Payer: Medicare Other | Admitting: Internal Medicine

## 2012-12-25 ENCOUNTER — Ambulatory Visit (INDEPENDENT_AMBULATORY_CARE_PROVIDER_SITE_OTHER): Payer: Medicare Other

## 2012-12-25 DIAGNOSIS — D518 Other vitamin B12 deficiency anemias: Secondary | ICD-10-CM

## 2012-12-25 DIAGNOSIS — D519 Vitamin B12 deficiency anemia, unspecified: Secondary | ICD-10-CM

## 2012-12-25 MED ORDER — CYANOCOBALAMIN 1000 MCG/ML IJ SOLN
1000.0000 ug | Freq: Once | INTRAMUSCULAR | Status: AC
  Administered 2012-12-25: 1000 ug via INTRAMUSCULAR

## 2013-01-26 ENCOUNTER — Ambulatory Visit (INDEPENDENT_AMBULATORY_CARE_PROVIDER_SITE_OTHER): Payer: Medicare Other

## 2013-01-26 DIAGNOSIS — E538 Deficiency of other specified B group vitamins: Secondary | ICD-10-CM

## 2013-01-26 MED ORDER — CYANOCOBALAMIN 1000 MCG/ML IJ SOLN
1000.0000 ug | Freq: Once | INTRAMUSCULAR | Status: AC
  Administered 2013-01-26: 1000 ug via INTRAMUSCULAR

## 2013-02-03 ENCOUNTER — Other Ambulatory Visit: Payer: Self-pay | Admitting: Internal Medicine

## 2013-02-23 ENCOUNTER — Ambulatory Visit (INDEPENDENT_AMBULATORY_CARE_PROVIDER_SITE_OTHER): Payer: Medicare Other

## 2013-02-23 DIAGNOSIS — E538 Deficiency of other specified B group vitamins: Secondary | ICD-10-CM

## 2013-02-23 MED ORDER — CYANOCOBALAMIN 1000 MCG/ML IJ SOLN
1000.0000 ug | Freq: Once | INTRAMUSCULAR | Status: AC
  Administered 2013-02-23: 1000 ug via INTRAMUSCULAR

## 2013-03-25 ENCOUNTER — Ambulatory Visit: Payer: Medicare Other

## 2013-03-26 ENCOUNTER — Ambulatory Visit (INDEPENDENT_AMBULATORY_CARE_PROVIDER_SITE_OTHER): Payer: Medicare Other

## 2013-03-26 DIAGNOSIS — E538 Deficiency of other specified B group vitamins: Secondary | ICD-10-CM

## 2013-03-26 MED ORDER — CYANOCOBALAMIN 1000 MCG/ML IJ SOLN
1000.0000 ug | Freq: Once | INTRAMUSCULAR | Status: AC
  Administered 2013-03-26: 1000 ug via INTRAMUSCULAR

## 2013-04-24 ENCOUNTER — Ambulatory Visit (INDEPENDENT_AMBULATORY_CARE_PROVIDER_SITE_OTHER): Payer: Medicare Other | Admitting: *Deleted

## 2013-04-24 DIAGNOSIS — E538 Deficiency of other specified B group vitamins: Secondary | ICD-10-CM

## 2013-04-24 MED ORDER — CYANOCOBALAMIN 1000 MCG/ML IJ SOLN: 1000.0000 ug | Freq: Once | INTRAMUSCULAR | Status: DC

## 2013-04-24 MED ORDER — CYANOCOBALAMIN 1000 MCG/ML IJ SOLN
1000.0000 ug | Freq: Once | INTRAMUSCULAR | Status: AC
  Administered 2013-04-24: 1000 ug via INTRAMUSCULAR

## 2013-05-09 ENCOUNTER — Other Ambulatory Visit: Payer: Self-pay | Admitting: Internal Medicine

## 2013-05-11 ENCOUNTER — Other Ambulatory Visit: Payer: Self-pay | Admitting: *Deleted

## 2013-05-11 MED ORDER — SIMVASTATIN 20 MG PO TABS: ORAL_TABLET | ORAL | Status: DC

## 2013-05-11 NOTE — Telephone Encounter (Signed)
Simvastatin refill sent to pharmacy 

## 2013-05-25 ENCOUNTER — Ambulatory Visit (INDEPENDENT_AMBULATORY_CARE_PROVIDER_SITE_OTHER): Payer: Medicare Other | Admitting: *Deleted

## 2013-05-25 DIAGNOSIS — Z23 Encounter for immunization: Secondary | ICD-10-CM

## 2013-05-25 DIAGNOSIS — E538 Deficiency of other specified B group vitamins: Secondary | ICD-10-CM

## 2013-05-25 MED ORDER — CYANOCOBALAMIN 1000 MCG/ML IJ SOLN
1000.0000 ug | Freq: Once | INTRAMUSCULAR | Status: AC
  Administered 2013-05-25: 1000 ug via INTRAMUSCULAR

## 2013-06-11 ENCOUNTER — Telehealth: Payer: Self-pay

## 2013-06-11 NOTE — Telephone Encounter (Signed)
Medication List and allergies: reviewed and updated  90 day supply/mail order: na Local prescriptions: [facility]  Immunizations due: shingles and PNA  A/P:   No changes to FH or PSH PSA--sees urology--Dr Logan Bores No more CCS per Dr Arlyce Dice Health Maintenance  Topic Date Due  . Zostavax  06/14/1987  . Pneumococcal Polysaccharide Vaccine Age 77 And Over  06/13/1992  . Influenza Vaccine  02/20/2014  . Colonoscopy  02/18/2019  . Tetanus/tdap  12/04/2022   To Discuss with Provider: Not at this time

## 2013-06-12 ENCOUNTER — Ambulatory Visit (INDEPENDENT_AMBULATORY_CARE_PROVIDER_SITE_OTHER): Payer: Medicare Other | Admitting: Internal Medicine

## 2013-06-12 ENCOUNTER — Encounter: Payer: Self-pay | Admitting: Internal Medicine

## 2013-06-12 VITALS — BP 126/68 | HR 62 | Temp 97.2°F | Ht 70.25 in | Wt 188.2 lb

## 2013-06-12 DIAGNOSIS — E538 Deficiency of other specified B group vitamins: Secondary | ICD-10-CM

## 2013-06-12 DIAGNOSIS — Z86010 Personal history of colonic polyps: Secondary | ICD-10-CM

## 2013-06-12 DIAGNOSIS — N400 Enlarged prostate without lower urinary tract symptoms: Secondary | ICD-10-CM

## 2013-06-12 DIAGNOSIS — Z0000 Encounter for general adult medical examination without abnormal findings: Secondary | ICD-10-CM

## 2013-06-12 DIAGNOSIS — E782 Mixed hyperlipidemia: Secondary | ICD-10-CM

## 2013-06-12 NOTE — Progress Notes (Signed)
Subjective:    Patient ID: Justin [name], male    DOB: 1925-01-28, 79 y.o.   MRN: [id number]  HPI Medicare Wellness Visit: Psychosocial and medical history were reviewed as required by Medicare (history related to abuse, antisocial behavior , firearm risk). Social history: Caffeine: 2 cups coffee/day , Alcohol: rare , Tobacco use: quit 1978 Exercise:see below Personal safety/fall risk:no Limitations of activities of daily living:no Seatbelt/ smoke alarm use:yes Healthcare Power of Attorney/Living Will status: needed Ophthalmologic exam status:pending Hearing evaluation status: currrent Orientation: Oriented X 3 Memory and recall: good Spelling testing: good Depression/anxiety assessment: denied Foreign travel history:1952 [place] Immunization status for influenza/pneumonia/ shingles /tetanus:no shots taken; risks discussed Transfusion history:no Preventive health care maintenance status: Colonoscopy as per protocol/standard care: ? due Dental care:every 6 mos Chart reviewed and updated. Active issues reviewed and addressed as documented below.    Review of Systems A heart healthy diet is not followed; exercise is yard work  per week as weekly. There is medication compliance with the statin. Low dose ASA taken Specifically denied are chest pain, palpitations, dyspnea, or claudication.  Significant abdominal symptoms, memory deficit, or myalgias not present.     Objective:   Physical Exam  Gen.:  well-nourished in appearance. Alert, appropriate and cooperative throughout exam.Appears younger than stated age  Head: Normocephalic without obvious abnormalities; pattern alopecia  Eyes: No corneal or conjunctival inflammation noted. Pupils equal round reactive to light and accommodation. Extraocular motion intact. Ears: External  ear exam reveals no significant lesions or deformities. Canals clear .TMs normal.  Nose: External nasal exam reveals no deformity or inflammation. Nasal  mucosa are pink and moist. No lesions or exudates noted.   Mouth: Oral mucosa and oropharynx reveal no lesions or exudates. Teeth in good repair. Neck: No deformities, masses, or tenderness noted. Range of motion & Thyroid normal. Lungs: Normal respiratory effort; chest expands symmetrically. Lungs are clear to auscultation without rales, wheezes, or increased work of breathing. Heart: Normal rate and rhythm. Normal S1 and S2. No gallop, click, or rub. S4 w/o murmur. Abdomen: Bowel sounds normal; abdomen soft and nontender. No masses, organomegaly or hernias noted. Genitalia:  as per Urology                                 Musculoskeletal/extremities:  There is some asymmetry of the posterior thoracic musculature suggesting occult scoliosis. No clubbing, cyanosis, edema, or significant extremity  deformity noted. Range of motion normal .Tone & strength normal. Hand joints normal . Fingernail  health good. Able to lie down & sit up w/o help. Negative SLR bilaterally Vascular: Carotid, radial artery, dorsalis pedis and  posterior tibial pulses are equal. Decreased DPP.No bruits present. Neurologic: Alert and oriented x3. Deep tendon reflexes symmetrical and normal.      Skin: Intact without suspicious lesions or rashes. Lymph: No cervical, axillary lymphadenopathy present. Psych: Mood and affect are normal. Normally interactive                                                                                        Assessment & Plan:  #  1 Medicare Wellness Exam; criteria met ; data entered #2 Problem List/Diagnoses reviewed Plan:  Assessments made/ Orders entered

## 2013-06-12 NOTE — Progress Notes (Signed)
Pre visit review using our clinic review tool, if applicable. No additional management support is needed unless otherwise documented below in the visit note. 

## 2013-06-12 NOTE — Patient Instructions (Signed)
Your next office appointment will be determined based upon review of your pending labs . Those instructions will be transmitted to you  by mail. 

## 2013-06-17 ENCOUNTER — Other Ambulatory Visit: Payer: Medicare Other

## 2013-06-22 ENCOUNTER — Other Ambulatory Visit (INDEPENDENT_AMBULATORY_CARE_PROVIDER_SITE_OTHER): Payer: Medicare Other

## 2013-06-22 ENCOUNTER — Encounter: Payer: Self-pay | Admitting: *Deleted

## 2013-06-22 ENCOUNTER — Other Ambulatory Visit: Payer: Self-pay | Admitting: Internal Medicine

## 2013-06-22 DIAGNOSIS — E782 Mixed hyperlipidemia: Secondary | ICD-10-CM

## 2013-06-22 DIAGNOSIS — E538 Deficiency of other specified B group vitamins: Secondary | ICD-10-CM

## 2013-06-22 DIAGNOSIS — D649 Anemia, unspecified: Secondary | ICD-10-CM

## 2013-06-22 DIAGNOSIS — Z86010 Personal history of colonic polyps: Secondary | ICD-10-CM

## 2013-06-22 LAB — HEPATIC FUNCTION PANEL
ALT: 19 U/L (ref 0–53)
AST: 20 U/L (ref 0–37)
Albumin: 3.8 g/dL (ref 3.5–5.2)
Alkaline Phosphatase: 62 U/L (ref 39–117)
Bilirubin, Direct: 0.1 mg/dL (ref 0.0–0.3)
Total Bilirubin: 0.5 mg/dL (ref 0.3–1.2)
Total Protein: 7.1 g/dL (ref 6.0–8.3)

## 2013-06-22 LAB — BASIC METABOLIC PANEL
BUN: 16 mg/dL (ref 6–23)
CO2: 30 mEq/L (ref 19–32)
Calcium: 8.8 mg/dL (ref 8.4–10.5)
Chloride: 104 mEq/L (ref 96–112)
Creatinine, Ser: 0.9 mg/dL (ref 0.4–1.5)
GFR: 82.9 mL/min (ref >60.00)
Glucose, Bld: 86 mg/dL (ref 70–99)
Potassium: 3.5 mEq/L (ref 3.5–5.1)
Sodium: 140 mEq/L (ref 135–145)

## 2013-06-22 LAB — CBC WITH DIFFERENTIAL/PLATELET
Basophils Absolute: 0 10*3/uL (ref 0.0–0.1)
Basophils Relative: 0.7 % (ref 0.0–3.0)
Eosinophils Absolute: 0.1 10*3/uL (ref 0.0–0.7)
Eosinophils Relative: 2.7 % (ref 0.0–5.0)
HCT: 38 % — ABNORMAL LOW (ref 39.0–52.0)
Hemoglobin: 13.1 g/dL (ref 13.0–17.0)
Lymphocytes Relative: 21.9 % (ref 12.0–46.0)
Lymphs Abs: 1.1 10*3/uL (ref 0.7–4.0)
MCHC: 34.4 g/dL (ref 30.0–36.0)
MCV: 94.9 fl (ref 78.0–100.0)
Monocytes Absolute: 0.6 10*3/uL (ref 0.1–1.0)
Monocytes Relative: 12 % (ref 3.0–12.0)
Neutro Abs: 3.3 10*3/uL (ref 1.4–7.7)
Neutrophils Relative %: 62.7 % (ref 43.0–77.0)
Platelets: 196 10*3/uL (ref 150.0–400.0)
RBC: 4.01 Mil/uL — ABNORMAL LOW (ref 4.22–5.81)
RDW: 13.2 % (ref 11.5–14.6)
WBC: 5.2 10*3/uL (ref 4.5–10.5)

## 2013-06-22 LAB — TSH: TSH: 4.66 u[IU]/mL (ref 0.35–5.50)

## 2013-06-22 LAB — LIPID PANEL
Cholesterol: 134 mg/dL (ref 0–200)
HDL: 51.3 mg/dL (ref >39.00)
LDL Cholesterol: 61 mg/dL (ref 0–99)
Total CHOL/HDL Ratio: 3
Triglycerides: 111 mg/dL (ref 0.0–149.0)
VLDL: 22.2 mg/dL (ref 0.0–40.0)

## 2013-06-22 LAB — VITAMIN B12: Vitamin B-12: 385 pg/mL (ref 211–911)

## 2013-07-03 ENCOUNTER — Ambulatory Visit (INDEPENDENT_AMBULATORY_CARE_PROVIDER_SITE_OTHER): Payer: Medicare Other | Admitting: *Deleted

## 2013-07-03 DIAGNOSIS — E538 Deficiency of other specified B group vitamins: Secondary | ICD-10-CM

## 2013-07-03 MED ORDER — CYANOCOBALAMIN 1000 MCG/ML IJ SOLN
1000.0000 ug | Freq: Once | INTRAMUSCULAR | Status: AC
  Administered 2013-07-03: 1000 ug via INTRAMUSCULAR

## 2013-07-03 MED ORDER — CYANOCOBALAMIN 1000 MCG/ML IJ SOLN: 1000.0000 ug | Freq: Once | INTRAMUSCULAR | Status: DC

## 2013-08-03 ENCOUNTER — Ambulatory Visit (INDEPENDENT_AMBULATORY_CARE_PROVIDER_SITE_OTHER): Payer: Medicare Other | Admitting: *Deleted

## 2013-08-03 DIAGNOSIS — E538 Deficiency of other specified B group vitamins: Secondary | ICD-10-CM

## 2013-08-03 MED ORDER — CYANOCOBALAMIN 1000 MCG/ML IJ SOLN
1000.0000 ug | Freq: Once | INTRAMUSCULAR | Status: AC
  Administered 2013-08-03: 1000 ug via INTRAMUSCULAR

## 2013-09-03 ENCOUNTER — Ambulatory Visit (INDEPENDENT_AMBULATORY_CARE_PROVIDER_SITE_OTHER): Payer: Medicare Other

## 2013-09-03 DIAGNOSIS — E538 Deficiency of other specified B group vitamins: Secondary | ICD-10-CM

## 2013-09-03 MED ORDER — CYANOCOBALAMIN 1000 MCG/ML IJ SOLN
1000.0000 ug | Freq: Once | INTRAMUSCULAR | Status: AC
  Administered 2013-09-03: 1000 ug via INTRAMUSCULAR

## 2013-10-07 ENCOUNTER — Ambulatory Visit (INDEPENDENT_AMBULATORY_CARE_PROVIDER_SITE_OTHER): Payer: Medicare Other | Admitting: *Deleted

## 2013-10-07 DIAGNOSIS — E538 Deficiency of other specified B group vitamins: Secondary | ICD-10-CM

## 2013-10-07 MED ORDER — CYANOCOBALAMIN 1000 MCG/ML IJ SOLN: 1000.0000 ug | Freq: Once | INTRAMUSCULAR | Status: DC

## 2013-10-07 MED ORDER — CYANOCOBALAMIN 1000 MCG/ML IJ SOLN
1000.0000 ug | Freq: Once | INTRAMUSCULAR | Status: AC
  Administered 2013-10-07: 1000 ug via INTRAMUSCULAR

## 2013-11-02 ENCOUNTER — Ambulatory Visit (INDEPENDENT_AMBULATORY_CARE_PROVIDER_SITE_OTHER): Payer: Medicare Other

## 2013-11-02 DIAGNOSIS — E538 Deficiency of other specified B group vitamins: Secondary | ICD-10-CM

## 2013-11-02 MED ORDER — CYANOCOBALAMIN 1000 MCG/ML IJ SOLN
1000.0000 ug | Freq: Once | INTRAMUSCULAR | Status: AC
  Administered 2013-11-02: 1000 ug via INTRAMUSCULAR

## 2013-12-01 ENCOUNTER — Ambulatory Visit (INDEPENDENT_AMBULATORY_CARE_PROVIDER_SITE_OTHER): Payer: Medicare Other | Admitting: *Deleted

## 2013-12-01 ENCOUNTER — Ambulatory Visit: Payer: Medicare Other

## 2013-12-01 DIAGNOSIS — E538 Deficiency of other specified B group vitamins: Secondary | ICD-10-CM

## 2013-12-01 MED ORDER — CYANOCOBALAMIN 1000 MCG/ML IJ SOLN
1000.0000 ug | Freq: Once | INTRAMUSCULAR | Status: AC
  Administered 2013-12-01: 1000 ug via INTRAMUSCULAR

## 2013-12-10 DIAGNOSIS — Z8042 Family history of malignant neoplasm of prostate: Secondary | ICD-10-CM | POA: Insufficient documentation

## 2013-12-10 DIAGNOSIS — N200 Calculus of kidney: Secondary | ICD-10-CM | POA: Insufficient documentation

## 2014-01-01 ENCOUNTER — Ambulatory Visit (INDEPENDENT_AMBULATORY_CARE_PROVIDER_SITE_OTHER): Payer: Medicare Other | Admitting: *Deleted

## 2014-01-01 DIAGNOSIS — E538 Deficiency of other specified B group vitamins: Secondary | ICD-10-CM

## 2014-01-01 MED ORDER — CYANOCOBALAMIN 1000 MCG/ML IJ SOLN
1000.0000 ug | Freq: Once | INTRAMUSCULAR | Status: AC
  Administered 2014-01-01: 1000 ug via INTRAMUSCULAR

## 2014-02-01 ENCOUNTER — Ambulatory Visit (INDEPENDENT_AMBULATORY_CARE_PROVIDER_SITE_OTHER): Payer: Medicare Other

## 2014-02-01 DIAGNOSIS — E538 Deficiency of other specified B group vitamins: Secondary | ICD-10-CM

## 2014-02-01 MED ORDER — CYANOCOBALAMIN 1000 MCG/ML IJ SOLN
1000.0000 ug | Freq: Once | INTRAMUSCULAR | Status: AC
  Administered 2014-02-01: 1000 ug via INTRAMUSCULAR

## 2014-02-16 ENCOUNTER — Other Ambulatory Visit: Payer: Self-pay | Admitting: Internal Medicine

## 2014-03-08 ENCOUNTER — Ambulatory Visit: Payer: Medicare Other

## 2014-03-09 ENCOUNTER — Ambulatory Visit (INDEPENDENT_AMBULATORY_CARE_PROVIDER_SITE_OTHER): Payer: Medicare Other

## 2014-03-09 DIAGNOSIS — E538 Deficiency of other specified B group vitamins: Secondary | ICD-10-CM

## 2014-03-09 MED ORDER — CYANOCOBALAMIN 1000 MCG/ML IJ SOLN
1000.0000 ug | Freq: Once | INTRAMUSCULAR | Status: AC
  Administered 2014-03-09: 1000 ug via INTRAMUSCULAR

## 2014-04-09 ENCOUNTER — Ambulatory Visit: Payer: Medicare Other

## 2014-04-12 ENCOUNTER — Ambulatory Visit (INDEPENDENT_AMBULATORY_CARE_PROVIDER_SITE_OTHER): Payer: Medicare Other

## 2014-04-12 DIAGNOSIS — E538 Deficiency of other specified B group vitamins: Secondary | ICD-10-CM

## 2014-04-12 MED ORDER — CYANOCOBALAMIN 1000 MCG/ML IJ SOLN
1000.0000 ug | Freq: Once | INTRAMUSCULAR | Status: AC
  Administered 2014-04-12: 1000 ug via INTRAMUSCULAR

## 2014-04-19 ENCOUNTER — Encounter: Payer: Self-pay | Admitting: Internal Medicine

## 2014-04-19 ENCOUNTER — Ambulatory Visit (INDEPENDENT_AMBULATORY_CARE_PROVIDER_SITE_OTHER): Payer: Medicare Other | Admitting: Internal Medicine

## 2014-04-19 ENCOUNTER — Ambulatory Visit (INDEPENDENT_AMBULATORY_CARE_PROVIDER_SITE_OTHER)
Admission: RE | Admit: 2014-04-19 | Discharge: 2014-04-19 | Disposition: A | Discharge status: Home / Self Care | Payer: Medicare Other | Source: Ambulatory Visit | Attending: Internal Medicine | Admitting: Internal Medicine

## 2014-04-19 VITALS — BP 132/70 | HR 65 | Temp 97.6°F | Resp 15 | Wt 187.0 lb

## 2014-04-19 DIAGNOSIS — S79929A Unspecified injury of unspecified thigh, initial encounter: Secondary | ICD-10-CM

## 2014-04-19 DIAGNOSIS — S79911A Unspecified injury of right hip, initial encounter: Secondary | ICD-10-CM

## 2014-04-19 DIAGNOSIS — S79919A Unspecified injury of unspecified hip, initial encounter: Secondary | ICD-10-CM

## 2014-04-19 NOTE — Progress Notes (Signed)
   Subjective:    Patient ID: Justin [name], male    DOB: 10/15/1924, 80 y.o.   MRN: [id number]  HPI He was carrying a computer and descending brick steps 04/12/14 when he missed the last step and fell on his right hip.  There was no cardiac or neuro prodrome prior to the fall.  There was faint bruising initially which progressed.  He has not taken anything for pain. He was not seen to address the hip pain   Review of Systems  Denied were any change in heart rhythm or rate prior to the event. There was no associated chest pain or shortness of breath .  Also specifically denied prior to the episode were headache, limb weakness, tingling, or numbness. No seizure activity noted.  He denies hematuria or rectal bleeding.  He denies any numbness, tingling, weakness lower extremity. He has no urine or stool incontinence.      Objective:   Physical Exam    Positive or pertinent findings include: He is able to walk with a normal gait and is able to walk on his heels and toes. He is able to lie flat and sit up without help. There is negative straight leg raising. He has no pain with range of motion of right hip. There is pain with light percussion over the lateral upper thigh. He has extensive 19 x 20 cm inverted/reverse L-shaped ecchymosis over the right hip.   General appearance :adequately nourished; in no distress. Heart:  Normal rate and regular rhythm. S1 and S2 normal without gallop, murmur, click, rub or other extra sounds   Lungs:Chest clear to auscultation; no wheezes, rhonchi,rales ,or rubs present.No increased work of breathing.  Abdomen: bowel sounds normal, soft and non-tender without masses, organomegaly or hernias noted.  No guarding or rebound.  Skin:Warm & dry.  Intact without suspicious lesions or rashes ; no jaundice or tenting Lymphatic: No lymphadenopathy is noted about the head, neck, axilla            Assessment & Plan:   #1 right hip trauma, rule out  hairline fracture  Plan see orders and after visit summary

## 2014-04-19 NOTE — Patient Instructions (Addendum)
Use warm moist compresses to 3 times a day to the affected area. Your next office appointment will be determined based upon review of your pending or x-rays. Those instructions will be transmitted to you by mail

## 2014-04-19 NOTE — Progress Notes (Signed)
Pre visit review using our clinic review tool, if applicable. No additional management support is needed unless otherwise documented below in the visit note. 

## 2014-05-10 ENCOUNTER — Ambulatory Visit (INDEPENDENT_AMBULATORY_CARE_PROVIDER_SITE_OTHER): Payer: Medicare Other

## 2014-05-10 DIAGNOSIS — E538 Deficiency of other specified B group vitamins: Secondary | ICD-10-CM

## 2014-05-10 DIAGNOSIS — Z23 Encounter for immunization: Secondary | ICD-10-CM

## 2014-05-10 MED ORDER — CYANOCOBALAMIN 1000 MCG/ML IJ SOLN
1000.0000 ug | Freq: Once | INTRAMUSCULAR | Status: AC
  Administered 2014-05-10: 1000 ug via INTRAMUSCULAR

## 2014-06-02 ENCOUNTER — Other Ambulatory Visit: Payer: Self-pay | Admitting: Dermatology

## 2014-06-10 ENCOUNTER — Ambulatory Visit (INDEPENDENT_AMBULATORY_CARE_PROVIDER_SITE_OTHER): Payer: Medicare Other

## 2014-06-10 DIAGNOSIS — E538 Deficiency of other specified B group vitamins: Secondary | ICD-10-CM

## 2014-06-10 MED ORDER — CYANOCOBALAMIN 1000 MCG/ML IJ SOLN
1000.0000 ug | Freq: Once | INTRAMUSCULAR | Status: AC
  Administered 2014-06-10: 1000 ug via INTRAMUSCULAR

## 2014-06-10 NOTE — Progress Notes (Signed)
Pt tolerated injection well

## 2014-06-10 NOTE — Progress Notes (Signed)
Pre visit review using our clinic review tool, if applicable. No additional management support is needed unless otherwise documented below in the visit note. 

## 2014-06-16 ENCOUNTER — Encounter: Payer: Medicare Other | Admitting: Internal Medicine

## 2014-07-01 ENCOUNTER — Other Ambulatory Visit: Payer: Self-pay | Admitting: Internal Medicine

## 2014-07-01 ENCOUNTER — Encounter: Payer: Self-pay | Admitting: Internal Medicine

## 2014-07-01 ENCOUNTER — Other Ambulatory Visit (INDEPENDENT_AMBULATORY_CARE_PROVIDER_SITE_OTHER): Payer: Medicare Other

## 2014-07-01 ENCOUNTER — Ambulatory Visit (INDEPENDENT_AMBULATORY_CARE_PROVIDER_SITE_OTHER): Payer: Medicare Other | Admitting: Internal Medicine

## 2014-07-01 VITALS — BP 120/60 | HR 61 | Temp 97.9°F | Resp 14 | Ht 69.75 in | Wt 186.1 lb

## 2014-07-01 DIAGNOSIS — E782 Mixed hyperlipidemia: Secondary | ICD-10-CM

## 2014-07-01 DIAGNOSIS — Z8673 Personal history of transient ischemic attack (TIA), and cerebral infarction without residual deficits: Secondary | ICD-10-CM

## 2014-07-01 DIAGNOSIS — Z86010 Personal history of colonic polyps: Secondary | ICD-10-CM

## 2014-07-01 DIAGNOSIS — E538 Deficiency of other specified B group vitamins: Secondary | ICD-10-CM

## 2014-07-01 LAB — HEPATIC FUNCTION PANEL
ALT: 18 U/L (ref 0–53)
AST: 23 U/L (ref 0–37)
Albumin: 3.9 g/dL (ref 3.5–5.2)
Alkaline Phosphatase: 66 U/L (ref 39–117)
Bilirubin, Direct: 0.2 mg/dL (ref 0.0–0.3)
Total Bilirubin: 0.9 mg/dL (ref 0.2–1.2)
Total Protein: 7 g/dL (ref 6.0–8.3)

## 2014-07-01 LAB — CK: Total CK: 138 U/L (ref 7–232)

## 2014-07-01 LAB — CBC WITH DIFFERENTIAL/PLATELET
Basophils Absolute: 0 10*3/uL (ref 0.0–0.1)
Basophils Relative: 0.8 % (ref 0.0–3.0)
Eosinophils Absolute: 0.2 10*3/uL (ref 0.0–0.7)
Eosinophils Relative: 2.9 % (ref 0.0–5.0)
HCT: 37 % — ABNORMAL LOW (ref 39.0–52.0)
Hemoglobin: 12.6 g/dL — ABNORMAL LOW (ref 13.0–17.0)
Lymphocytes Relative: 27.8 % (ref 12.0–46.0)
Lymphs Abs: 1.5 10*3/uL (ref 0.7–4.0)
MCHC: 34.1 g/dL (ref 30.0–36.0)
MCV: 95.1 fl (ref 78.0–100.0)
Monocytes Absolute: 0.6 10*3/uL (ref 0.1–1.0)
Monocytes Relative: 11.8 % (ref 3.0–12.0)
Neutro Abs: 3.1 10*3/uL (ref 1.4–7.7)
Neutrophils Relative %: 56.7 % (ref 43.0–77.0)
Platelets: 200 10*3/uL (ref 150.0–400.0)
RBC: 3.89 Mil/uL — ABNORMAL LOW (ref 4.22–5.81)
RDW: 13.1 % (ref 11.5–15.5)
WBC: 5.5 10*3/uL (ref 4.0–10.5)

## 2014-07-01 LAB — VITAMIN B12: Vitamin B-12: 380 pg/mL (ref 211–911)

## 2014-07-01 LAB — TSH: TSH: 2.65 u[IU]/mL (ref 0.35–4.50)

## 2014-07-01 LAB — BASIC METABOLIC PANEL
BUN: 17 mg/dL (ref 6–23)
CO2: 27 mEq/L (ref 19–32)
Calcium: 8.6 mg/dL (ref 8.4–10.5)
Chloride: 101 mEq/L (ref 96–112)
Creatinine, Ser: 0.8 mg/dL (ref 0.4–1.5)
GFR: 91.86 mL/min (ref >60.00)
Glucose, Bld: 87 mg/dL (ref 70–99)
Potassium: 3.4 mEq/L — ABNORMAL LOW (ref 3.5–5.1)
Sodium: 134 mEq/L — ABNORMAL LOW (ref 135–145)

## 2014-07-01 NOTE — Assessment & Plan Note (Signed)
NMR Lipids, LFTs, TSH ,CK

## 2014-07-01 NOTE — Patient Instructions (Signed)
Your next office appointment will be determined based upon review of your pending labs . Those instructions will be transmitted to you by mail  Please report any significant change in your symptoms.  Reflux of gastric acid may be asymptomatic as this may occur mainly during sleep.The triggers for reflux  include stress; the "aspirin family" ; alcohol; peppermint; and caffeine (coffee, tea, cola, and chocolate). The aspirin family would include aspirin and the nonsteroidal agents such as ibuprofen &  Naproxen. Tylenol would not cause reflux. If having symptoms ; food & drink should be avoided for @ least 2 hours before going to bed.

## 2014-07-01 NOTE — Assessment & Plan Note (Signed)
Lipids, LFTs, TSH ,CK 

## 2014-07-01 NOTE — Progress Notes (Signed)
Pre visit review using our clinic review tool, if applicable. No additional management support is needed unless otherwise documented below in the visit note. 

## 2014-07-01 NOTE — Progress Notes (Signed)
   Subjective:    Patient ID: Justin [name], male    DOB: 12-Sep-1924, 80 y.o.   MRN: [id number]  HPI  He is here to assess active health issues & conditions. PMH, FH, & Social history verified & updated  He has been compliant with his statin without adverse effects  He is not on a heart healthy diet. He is very physically active without cardiopulmonary symptoms  He's had malodorous breath; Gas Relief over-the-counter has been of some benefit. He describes rare dyspepsia  He did have a tubular adenoma 2010; his mother had colon cancer and sister had colon polyps.    Review of Systems   Chest pain, palpitations, tachycardia, exertional dyspnea, paroxysmal nocturnal dyspnea, claudication or edema are absent.  Unexplained weight loss, abdominal pain, significant dyspepsia, dysphagia, melena, rectal bleeding, or persistently small caliber stools are denied.     Objective:   Physical Exam Gen.: Healthy and well-nourished in appearance. Alert, appropriate and cooperative throughout exam. Appears younger than stated age  Head: Normocephalic without obvious abnormalities; pattern alopecia  Eyes: No corneal or conjunctival inflammation noted. Pupils equal round reactive to light and accommodation. Extraocular motion intact. Arcus Ears: External  ear exam reveals no significant lesions or deformities. Canals clear .TMs normal. Hearing is grossly decreased bilaterally. Nose: External nasal exam reveals no deformity or inflammation. Nasal mucosa are pink and moist. No lesions or exudates noted.   Mouth: Oral mucosa and oropharynx reveal no lesions or exudates. Teeth in good repair. Neck: No deformities, masses, or tenderness noted. Range of motion  & Thyroid normal Lungs: Normal respiratory effort; chest expands symmetrically. Lungs are clear to auscultation without rales, wheezes, or increased work of breathing. Heart: Normal rate and rhythm. Normal S1 and S2. No gallop, click, or rub. No  murmur. Abdomen: Bowel sounds normal; abdomen soft and nontender. No masses, organomegaly or hernias noted. Genitalia:  as per  Dr Amalia Hailey                               Musculoskeletal/extremities: There is some asymmetry of the posterior thoracic musculature suggesting occult scoliosis. No clubbing, cyanosis, edema, or significant extremity  deformity noted.  Range of motion normal . Tone & strength normal. Hand joints normal  Fingernail  health good. Ecchymosis R thumbnail Crepitus of knees  Able to lie down & sit up w/o help.  Negative SLR bilaterally Vascular: Carotid, radial artery, dorsalis pedis and  posterior tibial pulses are full and equal. No bruits present. Neurologic: Alert and oriented x3. Deep tendon reflexes symmetrical and normal.  Gait normal      Skin: Intact without suspicious lesions or rashes. Lymph: No cervical, axillary lymphadenopathy present. Psych: Mood and affect are normal. Normally interactive                                                                                        Assessment & Plan:  #1 See Current Assessment & Plan in Problem List under specific Diagnosis #2 halitosis, ? From GERD

## 2014-07-01 NOTE — Assessment & Plan Note (Signed)
B12 level 

## 2014-07-01 NOTE — Assessment & Plan Note (Signed)
CBC

## 2014-07-02 ENCOUNTER — Other Ambulatory Visit: Payer: Self-pay | Admitting: Internal Medicine

## 2014-07-02 DIAGNOSIS — E876 Hypokalemia: Secondary | ICD-10-CM

## 2014-07-02 DIAGNOSIS — D649 Anemia, unspecified: Secondary | ICD-10-CM

## 2014-07-03 LAB — NMR LIPOPROFILE WITH LIPIDS
Cholesterol, Total: 132 mg/dL (ref 100–199)
HDL Particle Number: 27.9 umol/L — ABNORMAL LOW (ref >=30.5)
HDL Size: 10.2 nm (ref >=9.2)
HDL-C: 59 mg/dL (ref >39)
LDL (calc): 59 mg/dL (ref 0–99)
LDL Particle Number: 655 nmol/L (ref <1000)
LDL Size: 20.8 nm (ref >=20.8)
LP-IR Score: <25 (ref <=45)
Large HDL-P: 10.8 umol/L (ref >=4.8)
Large VLDL-P: 1.7 nmol/L (ref <=2.7)
Small LDL Particle Number: 269 nmol/L (ref <=527)
Triglycerides: 68 mg/dL (ref 0–149)
VLDL Size: 45.2 nm (ref <=46.6)

## 2014-07-21 ENCOUNTER — Ambulatory Visit (INDEPENDENT_AMBULATORY_CARE_PROVIDER_SITE_OTHER): Payer: Medicare Other

## 2014-07-21 DIAGNOSIS — E538 Deficiency of other specified B group vitamins: Secondary | ICD-10-CM

## 2014-07-21 MED ORDER — CYANOCOBALAMIN 1000 MCG/ML IJ SOLN
1000.0000 ug | Freq: Once | INTRAMUSCULAR | Status: AC
  Administered 2014-07-21: 1000 ug via INTRAMUSCULAR

## 2014-07-21 NOTE — Progress Notes (Signed)
Pt tolerated injection well

## 2014-07-21 NOTE — Progress Notes (Signed)
Pre visit review using our clinic review tool, if applicable. No additional management support is needed unless otherwise documented below in the visit note. 

## 2014-08-20 ENCOUNTER — Encounter: Payer: Self-pay | Admitting: Gastroenterology

## 2014-08-20 ENCOUNTER — Ambulatory Visit (INDEPENDENT_AMBULATORY_CARE_PROVIDER_SITE_OTHER): Payer: Medicare Other

## 2014-08-20 DIAGNOSIS — E538 Deficiency of other specified B group vitamins: Secondary | ICD-10-CM

## 2014-08-23 MED ORDER — CYANOCOBALAMIN 1000 MCG/ML IJ SOLN
1000.0000 ug | Freq: Once | INTRAMUSCULAR | Status: AC
  Administered 2014-08-20: 1000 ug via INTRAMUSCULAR

## 2014-09-21 ENCOUNTER — Ambulatory Visit (INDEPENDENT_AMBULATORY_CARE_PROVIDER_SITE_OTHER): Payer: Medicare Other

## 2014-09-21 DIAGNOSIS — E538 Deficiency of other specified B group vitamins: Secondary | ICD-10-CM

## 2014-09-21 MED ORDER — CYANOCOBALAMIN 1000 MCG/ML IJ SOLN
1000.0000 ug | Freq: Once | INTRAMUSCULAR | Status: AC
  Administered 2014-09-21: 1000 ug via INTRAMUSCULAR

## 2014-10-08 ENCOUNTER — Ambulatory Visit: Payer: Medicare Other | Admitting: Gastroenterology

## 2014-10-13 ENCOUNTER — Encounter: Payer: Self-pay | Admitting: Gastroenterology

## 2014-10-13 ENCOUNTER — Ambulatory Visit (INDEPENDENT_AMBULATORY_CARE_PROVIDER_SITE_OTHER): Payer: Medicare Other | Admitting: Gastroenterology

## 2014-10-13 VITALS — BP 130/60 | HR 80 | Ht 69.75 in | Wt 186.4 lb

## 2014-10-13 DIAGNOSIS — Z86010 Personal history of colonic polyps: Secondary | ICD-10-CM

## 2014-10-13 NOTE — Patient Instructions (Signed)
You will receive a cologuard test in the mail Information provided to you

## 2014-10-13 NOTE — Assessment & Plan Note (Signed)
Patient is asymptomatic.  Generally we stop surveillance colonoscopy at 78-80.  In view of the patient's advanced age and absence of symptoms I don't think that screening colonoscopy is warranted.  In lieu of that test, I will check a Cologuard.

## 2014-10-13 NOTE — Progress Notes (Signed)
_                                                                                                                History of Present Illness:  Justin [name] pleasant 81 year old white male referred at the request of Dr. Linna Darner for colorectal cancer screening.  Family history is pertinent for his mother who had colon cancer.  He has had  colonoscopies through the years where adenomatous polyps have been removed.  Last examined 2010 was pertinent for small adenomatous polyp.  He has no GI complaints including change of bowel habits, abdominal pain or rectal bleeding.  He has seen spots of blood on the toilet tissue if he has a hard stool.   Past Medical History  Diagnosis Date  . Anemia   . Chronic kidney disease   . Hyperlipidemia   . Basal cell cancer     Dr Wilhemina Bonito  . Diverticulosis   . Transient ischemic attack     Dr Leonie Man  . Benign prostatic hypertrophy     Dr Amalia Hailey  . Kidney stones   . Stroke   . Colon polyp    Past Surgical History  Procedure Laterality Date  . Lithotripsy       X 2;DrAmalia Hailey  . Hemorrhoid surgery    . Colonoscopy w/ polypectomy  01/2009    adenomatous, Dr. Alben Spittle  . Cataract extraction, bilateral    . Prostate biopsy      X2   family history includes Colon cancer in his mother; Colon polyps in his sister; Coronary artery disease in his brother; Diabetes in his brother and mother; Heart attack in his brother and another family member; Heart attack (age of onset: 7) in his father; Heart failure in his father; Prostate cancer in his brother; Stroke in his brother. Current Outpatient Prescriptions  Medication Sig Dispense Refill  . aspirin 81 MG tablet Take 81 mg by mouth daily.    . finasteride (PROSCAR) 5 MG tablet Take 5 mg by mouth daily.      . simvastatin (ZOCOR) 20 MG tablet TAKE 1 TABLET (20 MG TOTAL) BY MOUTH AT BEDTIME. 90 tablet 3   No current facility-administered medications for this visit.   Allergies as of 10/13/2014 -  Review Complete 10/13/2014  Allergen Reaction Noted  . Prednisone    . Inderal [propranolol]  12/31/2011    reports that he quit smoking about 38 years ago. His smoking use included Cigarettes, Pipe, and Cigars. He has quit using smokeless tobacco. His smokeless tobacco use included Chew. He reports that he drinks alcohol. He reports that he does not use illicit drugs.   Review of Systems: Pertinent positive and negative review of systems were noted in the above HPI section. All other review of systems were otherwise negative.  Vital signs were reviewed in today's medical record Physical Exam: General: Well developed , well nourished, no acute distress Skin: anicteric Head: Normocephalic and atraumatic Eyes:  sclerae anicteric, EOMI Ears: Normal auditory acuity  Mouth: No deformity or lesions Neck: Supple, no masses or thyromegaly Lungs: Clear throughout to auscultation Heart: Regular rate and rhythm; no murmurs, rubs or bruits Abdomen: Soft, non tender and non distended. No masses, hepatosplenomegaly or hernias noted. Normal Bowel sounds Rectal:deferred Musculoskeletal: Symmetrical with no gross deformities  Skin: No lesions on visible extremities Pulses:  Normal pulses noted Extremities: No clubbing, cyanosis, edema or deformities noted Neurological: Alert oriented x 4, grossly nonfocal Cervical Nodes:  No significant cervical adenopathy Inguinal Nodes: No significant inguinal adenopathy Psychological:  Alert and cooperative. Normal mood and affect  See Assessment and Plan under Problem List

## 2014-10-28 ENCOUNTER — Ambulatory Visit (INDEPENDENT_AMBULATORY_CARE_PROVIDER_SITE_OTHER): Payer: Medicare Other | Admitting: *Deleted

## 2014-10-28 DIAGNOSIS — E538 Deficiency of other specified B group vitamins: Secondary | ICD-10-CM

## 2014-10-28 MED ORDER — CYANOCOBALAMIN 1000 MCG/ML IJ SOLN
1000.0000 ug | Freq: Once | INTRAMUSCULAR | Status: AC
  Administered 2014-10-28: 1000 ug via INTRAMUSCULAR

## 2014-10-28 NOTE — Progress Notes (Signed)
Pre visit review using our clinic review tool, if applicable. No additional management support is needed unless otherwise documented below in the visit note.  Patient tolerated injection well.  

## 2014-11-15 ENCOUNTER — Telehealth: Payer: Self-pay

## 2014-11-15 NOTE — Telephone Encounter (Signed)
Patient called to educate on Medicare Wellness apt. LVM for the patient to call back to educate and schedule for wellness visit.   

## 2014-11-17 ENCOUNTER — Ambulatory Visit: Payer: Medicare Other

## 2014-11-17 ENCOUNTER — Telehealth: Payer: Self-pay

## 2014-11-17 NOTE — Telephone Encounter (Signed)
Late entry; patient called back and scheduled AWV for 4pm on the 27th.

## 2014-11-30 ENCOUNTER — Ambulatory Visit: Payer: Medicare Other

## 2014-12-01 ENCOUNTER — Ambulatory Visit: Payer: Medicare Other

## 2014-12-08 ENCOUNTER — Ambulatory Visit (INDEPENDENT_AMBULATORY_CARE_PROVIDER_SITE_OTHER): Payer: Medicare Other | Admitting: *Deleted

## 2014-12-08 DIAGNOSIS — E538 Deficiency of other specified B group vitamins: Secondary | ICD-10-CM | POA: Diagnosis not present

## 2014-12-08 MED ORDER — CYANOCOBALAMIN 1000 MCG/ML IJ SOLN
1000.0000 ug | Freq: Once | INTRAMUSCULAR | Status: AC
  Administered 2014-12-08: 1000 ug via INTRAMUSCULAR

## 2014-12-15 ENCOUNTER — Telehealth: Payer: Self-pay | Admitting: Gastroenterology

## 2014-12-16 NOTE — Telephone Encounter (Signed)
[employer] will not cover the Cologuard. You can try for an exception with a letter explaining why he needs to be screened.

## 2014-12-17 NOTE — Telephone Encounter (Signed)
Please contact the patient and let him know.  It is his choice to either before, not have the test, or have a colonoscopy

## 2014-12-29 NOTE — Telephone Encounter (Signed)
I have left message for the patient to call back. Insurance will not cover screening past age 79.

## 2015-01-19 ENCOUNTER — Ambulatory Visit (INDEPENDENT_AMBULATORY_CARE_PROVIDER_SITE_OTHER): Payer: Medicare Other

## 2015-01-19 DIAGNOSIS — D509 Iron deficiency anemia, unspecified: Secondary | ICD-10-CM

## 2015-01-19 MED ORDER — CYANOCOBALAMIN 1000 MCG/ML IJ SOLN
1000.0000 ug | Freq: Once | INTRAMUSCULAR | Status: AC
  Administered 2015-01-19: 1000 ug via INTRAMUSCULAR

## 2015-02-23 ENCOUNTER — Ambulatory Visit (INDEPENDENT_AMBULATORY_CARE_PROVIDER_SITE_OTHER): Payer: Medicare Other | Admitting: *Deleted

## 2015-02-23 DIAGNOSIS — E538 Deficiency of other specified B group vitamins: Secondary | ICD-10-CM

## 2015-02-23 MED ORDER — CYANOCOBALAMIN 1000 MCG/ML IJ SOLN
1000.0000 ug | Freq: Once | INTRAMUSCULAR | Status: AC
  Administered 2015-02-23: 1000 ug via INTRAMUSCULAR

## 2015-02-25 ENCOUNTER — Ambulatory Visit (INDEPENDENT_AMBULATORY_CARE_PROVIDER_SITE_OTHER): Payer: Worker's Compensation | Admitting: Family Medicine

## 2015-02-25 ENCOUNTER — Ambulatory Visit: Payer: Worker's Compensation

## 2015-02-25 VITALS — BP 124/70 | HR 64 | Temp 97.8°F | Resp 16 | Ht 70.0 in | Wt 186.0 lb

## 2015-02-25 DIAGNOSIS — M25531 Pain in right wrist: Secondary | ICD-10-CM

## 2015-02-25 DIAGNOSIS — W010XXA Fall on same level from slipping, tripping and stumbling without subsequent striking against object, initial encounter: Secondary | ICD-10-CM | POA: Diagnosis not present

## 2015-02-25 NOTE — Progress Notes (Signed)
  Subjective:  Patient ID: Justin [name], male    DOB: [birth year]  Age: 81 y.o. 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 He went into a business today. It was raining and his feet were wet. He got on the concrete surface in the building and when a few steps and didn't realize house like he was with his wet feet and he fell landing hard on his right hand. He has been hurting ever since. Unable to shift gears in the pain of the wrist, also to turn the wheel hard on a tractor trailer.  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   Objective:   Mild swelling of right wrist. Diffuse tenderness this along the joint line but also in the distal radius. Minimal tenderness at the anatomic snuffbox region. I believe the pain is more proximal to that however.  Assessment & Plan:   Assessment:  Fall on wet surface Wrist injury and pain  Plan:   X-ray wrist  [facility] reading (PRIMARY) by  Dr. Linna Darner No fracture seen.  Possible osteoporosis  .    Patient Instructions  Wear a wrist splint.  Ice for about 15 minutes for 4 or 5 times daily.  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 You can come in sooner if it is completely better.     Ketina Mars, MD 02/25/2015

## 2015-02-25 NOTE — Patient Instructions (Addendum)
Wear a wrist splint.  Ice for about 15 minutes for 4 or 5 times daily.  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 You can come in sooner if it is completely better.

## 2015-02-27 ENCOUNTER — Other Ambulatory Visit: Payer: Self-pay | Admitting: Internal Medicine

## 2015-02-28 ENCOUNTER — Other Ambulatory Visit: Payer: Self-pay | Admitting: Emergency Medicine

## 2015-02-28 ENCOUNTER — Ambulatory Visit (INDEPENDENT_AMBULATORY_CARE_PROVIDER_SITE_OTHER): Payer: Worker's Compensation | Admitting: Family Medicine

## 2015-02-28 VITALS — BP 122/60 | HR 65 | Temp 98.0°F | Resp 18 | Ht 70.0 in | Wt 185.0 lb

## 2015-02-28 DIAGNOSIS — S63501D Unspecified sprain of right wrist, subsequent encounter: Secondary | ICD-10-CM | POA: Diagnosis not present

## 2015-02-28 DIAGNOSIS — M25531 Pain in right wrist: Secondary | ICD-10-CM

## 2015-02-28 MED ORDER — SIMVASTATIN 20 MG PO TABS: ORAL_TABLET | ORAL | Status: DC

## 2015-02-28 NOTE — Patient Instructions (Addendum)
You're cleared to return to work tomorrow.  Wear the wrist splint on an as-needed basis.  Be cautious not to reinjure the wrist.  No follow-up necessary unless you have further concerns

## 2015-02-28 NOTE — Progress Notes (Signed)
  Subjective:  Patient ID: Justin [name], male    DOB: October 20, 1924  Age: 81 y.o. MRN: [id number]  Patient is here for a follow-up from the sprain and pain of his right wrist. He is doing much better. He was able to drive over here well. He has good motion of his right wrist. He feels like it's not quite as strong feeling in and benefits a little from the splint still, but he has no concerns about holding the wheel the truck or shifting of years with that hand.   Objective:   Good range of motion of the wrist. Very adequate strength. Not quite as strong or grip as his left hand, but he can flex and extend and side to side tilt the wrist well. There is no swelling or significant tenderness.  Assessment & Plan:   Assessment:  Pain and sprain right wrist, significant amount improvement  Plan:  Return to regular duty tomorrow. He can use the splint on as-needed basis but I will not require its use at this point. If it is more comfortable to him he can use it.  There are no Patient Instructions on file for this visit.   Maya Scholer, MD 02/28/2015

## 2015-03-14 ENCOUNTER — Other Ambulatory Visit: Payer: Self-pay | Admitting: Internal Medicine

## 2015-03-14 ENCOUNTER — Other Ambulatory Visit: Payer: Self-pay | Admitting: Emergency Medicine

## 2015-03-14 MED ORDER — SIMVASTATIN 20 MG PO TABS: ORAL_TABLET | ORAL | Status: DC

## 2015-04-12 ENCOUNTER — Ambulatory Visit (INDEPENDENT_AMBULATORY_CARE_PROVIDER_SITE_OTHER): Payer: Medicare Other

## 2015-04-12 ENCOUNTER — Telehealth: Payer: Self-pay | Admitting: Gastroenterology

## 2015-04-12 DIAGNOSIS — E538 Deficiency of other specified B group vitamins: Secondary | ICD-10-CM | POA: Diagnosis not present

## 2015-04-12 MED ORDER — CYANOCOBALAMIN 1000 MCG/ML IJ SOLN
1000.0000 ug | Freq: Once | INTRAMUSCULAR | Status: AC
  Administered 2015-04-12: 1000 ug via INTRAMUSCULAR

## 2015-04-12 NOTE — Telephone Encounter (Signed)
Will check with Dr Linna Darner during his physical in December.

## 2015-05-19 ENCOUNTER — Ambulatory Visit (INDEPENDENT_AMBULATORY_CARE_PROVIDER_SITE_OTHER): Payer: Worker's Compensation | Admitting: Family Medicine

## 2015-05-19 VITALS — BP 122/60 | HR 57 | Temp 97.5°F | Resp 18 | Ht 70.0 in | Wt 183.0 lb

## 2015-05-19 DIAGNOSIS — M7581 Other shoulder lesions, right shoulder: Secondary | ICD-10-CM | POA: Diagnosis not present

## 2015-05-19 DIAGNOSIS — S60211D Contusion of right wrist, subsequent encounter: Secondary | ICD-10-CM

## 2015-05-19 NOTE — Progress Notes (Signed)
Subjective:  This chart was scribed for Reginia Forts, MD by Thea Alken, ED Scribe. This patient was seen in room 4 and the patient's care was started at 10:46 AM.   Patient ID: Justin [name], male    DOB: 1925-07-19, 81 y.o.   MRN: [id number]  HPI   Chief Complaint  Patient presents with  . Follow-up    rt hand and wrist    HPI Comments: Justin [name] is a 81 y.o. male who presents to the [facility] for a two month  follow up of Workman's Compensation injury in August 2016.  Pt's initial visit was 8/5 with Dr. Linna Darner after he slipped and fell hard on his right wrist and right hand. He had an xray on 8/5 that was negative. He had f/u on 8/8 and was doing much better and was returned back to work on full duty.  Today, pt states he is doing better. He has persistent throbbing pain across right wrist at night with rest. He also has pain in right proximal arm when hyperextending right arm.  Pt denies pain keeping him from working or awake at night. He is right hand dominant but has been favoring left hand. He denies numbness and tingling in right arm or fingers.  He wants to know when discomfort will resolve; no limitation of activity or range of motion. No hand swelling. Able to tolerate regular duty without issues.  Review of Systems  Constitutional: Negative for fever, chills, diaphoresis and fatigue.  Musculoskeletal: Positive for myalgias and arthralgias. Negative for back pain, joint swelling, gait problem, neck pain and neck stiffness.  Skin: Negative for color change, pallor, rash and wound.  Neurological: Negative for weakness and numbness.   Objective:   Physical Exam  Constitutional: He is oriented to person, place, and time. He appears well-developed and well-nourished. No distress.  HENT:  Head: Normocephalic and atraumatic.  Eyes: Conjunctivae and EOM are normal.  Neck: Neck supple.  Pulmonary/Chest: Effort normal.  Musculoskeletal:       Right  shoulder: He exhibits decreased range of motion and pain. He exhibits no tenderness, no bony tenderness, no spasm, normal pulse and normal strength.       Right elbow: Normal.He exhibits normal range of motion, no swelling, no effusion, no deformity and no laceration. No radial head, no medial epicondyle, no lateral epicondyle and no olecranon process tenderness noted.       Right wrist: He exhibits normal range of motion, no tenderness, no bony tenderness, no swelling and no effusion.       Cervical back: He exhibits decreased range of motion. He exhibits no tenderness and no bony tenderness.       Right hand: He exhibits normal range of motion, no tenderness, no bony tenderness and no swelling. Normal sensation noted. Normal strength noted.  R SHOULDER: ABLE TO ELEVATE TO 150 DEGREES B SHOULDERS; CROSS OVER NEGATIVE; EMPTY CAN SIGN NEGATIVE.  +REPRODUCIBLE PAIN ALONG DELTOID REGION WITH RANGE OF MOTION. R ELBOW: FULL ROM; NORMAL EXTENSION AND FLEXION; NORMAL SUPINATION AND PRONATION. R WRIST: NO SWELLING; FULL RANGE OF MOTION YET WITH SOME REPRODUCIBLE PAIN.  Neurological: He is alert and oriented to person, place, and time.  Skin: Skin is warm and dry. He is not diaphoretic.  Psychiatric: He has a normal mood and affect. His behavior is normal.  Nursing note and vitals reviewed.  Filed Vitals:   05/19/15 1026  BP: 122/60  Pulse: 57  Temp: 97.5 F (  36.4 C)  TempSrc: Oral  Resp: 18  Height: 5\' 10"  (1.778 m)  Weight: 183 lb (83.008 kg)  SpO2: 98%   Assessment & Plan:   1. Wrist contusion, right, subsequent encounter   2. Right rotator cuff tendonitis     1.  R wrist contusion subsequent:  Persistent pain with no limitation in activities.  Recommend ongoing rest with gradual increase in use.  Pt declined referral for formal physical therapy; able to tolerate regular duty at work. 2.  R rotator cuff tendonitis: New.  Home exercise program provided to perform daily; pt declined NSAID or  Cox-2 inhibitor.  RTC one month if no improvement.  Pt declined formal physical therapy for R rotator cuff syndrome; also declined R shoulder xray.  Regular duty.   By signing my name below, I, Raven Small, attest that this documentation has been prepared under the direction and in the presence of Reginia Forts, MD.  Electronically Signed: Thea Alken, ED Scribe. 05/19/2015. 12:29 PM.  I personally performed the services described in this documentation, which was scribed in my presence. The recorded information has been reviewed and considered.  Oluwaseyi Raffel Elayne Guerin, M.D. [facility] [address] [place], [place]  [phone] phone [phone] fax

## 2015-05-19 NOTE — Patient Instructions (Signed)

## 2015-05-23 ENCOUNTER — Telehealth: Payer: Self-pay | Admitting: Internal Medicine

## 2015-05-23 NOTE — Telephone Encounter (Signed)
ok 

## 2015-05-24 ENCOUNTER — Telehealth: Payer: Self-pay | Admitting: Internal Medicine

## 2015-05-24 NOTE — Telephone Encounter (Signed)
Dr Leonie Man will want updated report by me before he is seen. Please make routine appointment.

## 2015-05-24 NOTE — Telephone Encounter (Signed)
11.1.16 Pt is requesting referral in order to see Dr. Leonie Man at [facility]. Pt states he has seen Dr. Leonie Man before but it's been awhile. Pt also states if a visit with Dr. Linna Darner is needed before to please call him and he will schedule an appt. Please advise MS

## 2015-05-25 ENCOUNTER — Ambulatory Visit (INDEPENDENT_AMBULATORY_CARE_PROVIDER_SITE_OTHER): Payer: Medicare Other | Admitting: Internal Medicine

## 2015-05-25 ENCOUNTER — Encounter: Payer: Self-pay | Admitting: Internal Medicine

## 2015-05-25 VITALS — BP 140/62 | HR 63 | Wt 182.0 lb

## 2015-05-25 DIAGNOSIS — Z23 Encounter for immunization: Secondary | ICD-10-CM

## 2015-05-25 DIAGNOSIS — R29898 Other symptoms and signs involving the musculoskeletal system: Secondary | ICD-10-CM

## 2015-05-25 DIAGNOSIS — Z8673 Personal history of transient ischemic attack (TIA), and cerebral infarction without residual deficits: Secondary | ICD-10-CM

## 2015-05-25 NOTE — Progress Notes (Signed)
Pre visit review using our clinic review tool, if applicable. No additional management support is needed unless otherwise documented below in the visit note. 

## 2015-05-25 NOTE — Patient Instructions (Signed)
The Neurology referral will be scheduled and you'll be notified of the time.Please call the Referral Co-Ordinator @ [phone] if you have not been notified of appointment time within 7-10 days. 

## 2015-05-25 NOTE — Telephone Encounter (Signed)
appt has been made with PCP

## 2015-05-25 NOTE — Progress Notes (Signed)
   Subjective:    Patient ID: Justin [name], male    DOB: 12-07-1924, 81 y.o.   MRN: [id number]  HPI  He is requesting referral to his Neurologist for subjective weakness in the left upper extremity over the last 1-2 weeks. He questions whether this might be related to head trauma sustained in a fall in August of this year. There was no cardiac or neurologic prodrome prior to the fall.  At that time he slipped on wet concrete and landed on his right forearm. He rolled to his left striking his head. There was no loss of consciousness and he did not seek medical treatment at that time. He's had no no neuromuscular symptoms since that time until the episode of weakness began.  Review of Systems Fever, chills, sweats, or unexplained weight loss not present. No significant headaches. Mental status change or memory loss denied. Blurred vision , diplopia or vision loss absent. Vertigo, near syncope or imbalance denied. There is no numbness or tingling in extremities.   No loss of control of bladder or bowels. Radicular type pain absent. No seizure stigmata.    Objective:   Physical Exam Pertinent or positive findings include: Pattern alopecia is present. He has bilateral ptosis. Arcus senilis is present. There is slight decrease in the left nasolabial fold. He has minor rales at the bases without increased work of breathing. Fungal nail changes noted of the right thumbnail. He turns slowly when gait is tested. There is no definite cranial nerve deficit. I cannot appreciate significant weakness in the left upper extremity. Rhomberg is slightly unsteady but he does not fall.He has scattered bruising over the forearms. He is oriented 3.   General appearance :adequately nourished; in no distress.  Eyes: No conjunctival inflammation or scleral icterus is present.  Oral exam:  Lips and gums are healthy appearing.There is no oropharyngeal erythema or exudate noted. Dental hygiene is  good.  Heart:  Normal rate and regular rhythm. S1 and S2 normal without gallop, murmur, click, rub or other extra sounds    Lungs:Chest clear to auscultation; no wheezes, rhonchi,rales ,or rubs present.No increased work of breathing.   Abdomen: bowel sounds normal, soft and non-tender without masses, organomegaly or hernias noted.  No guarding or rebound.   Vascular : all pulses equal ; no bruits present.  Skin:Warm & dry.  Intact without suspicious lesions or rashes ; no tenting or jaundice   Lymphatic: No lymphadenopathy is noted about the head, neck, axilla.   Neuro: Strength, tone & DTRs normal.     Assessment & Plan:  #1 subjective weakness left upper extremity. This is not demonstrable on exam.  Because of his concerns referral be made to Neurology

## 2015-06-07 ENCOUNTER — Ambulatory Visit (INDEPENDENT_AMBULATORY_CARE_PROVIDER_SITE_OTHER): Payer: Medicare Other | Admitting: *Deleted

## 2015-06-07 ENCOUNTER — Ambulatory Visit: Payer: Medicare Other

## 2015-06-07 DIAGNOSIS — E538 Deficiency of other specified B group vitamins: Secondary | ICD-10-CM | POA: Diagnosis not present

## 2015-06-07 MED ORDER — CYANOCOBALAMIN 1000 MCG/ML IJ SOLN
1000.0000 ug | Freq: Once | INTRAMUSCULAR | Status: AC
  Administered 2015-06-07: 1000 ug via INTRAMUSCULAR

## 2015-07-04 ENCOUNTER — Encounter: Payer: Medicare Other | Admitting: Internal Medicine

## 2015-07-26 ENCOUNTER — Ambulatory Visit (INDEPENDENT_AMBULATORY_CARE_PROVIDER_SITE_OTHER): Payer: Medicare Other

## 2015-07-26 DIAGNOSIS — E538 Deficiency of other specified B group vitamins: Secondary | ICD-10-CM

## 2015-07-26 MED ORDER — CYANOCOBALAMIN 1000 MCG/ML IJ SOLN: 1000.0000 ug | Freq: Once | INTRAMUSCULAR | Status: DC

## 2015-07-26 NOTE — Progress Notes (Signed)
Pre visit review using our clinic review tool, if applicable. No additional management support is needed unless otherwise documented below in the visit note.  Patient in for B12 injection. Given IM Left deltoid. Patient tolerated well. Will call office to reschedule for 1 month.

## 2015-08-22 ENCOUNTER — Encounter: Payer: Self-pay | Admitting: Neurology

## 2015-08-22 ENCOUNTER — Ambulatory Visit (INDEPENDENT_AMBULATORY_CARE_PROVIDER_SITE_OTHER): Payer: Worker's Compensation | Admitting: Family Medicine

## 2015-08-22 ENCOUNTER — Ambulatory Visit (INDEPENDENT_AMBULATORY_CARE_PROVIDER_SITE_OTHER): Payer: Medicare Other | Admitting: Neurology

## 2015-08-22 ENCOUNTER — Encounter: Payer: Self-pay | Admitting: Gastroenterology

## 2015-08-22 ENCOUNTER — Ambulatory Visit: Payer: Worker's Compensation

## 2015-08-22 VITALS — BP 128/65 | HR 60 | Ht 70.0 in | Wt 185.6 lb

## 2015-08-22 VITALS — BP 120/70 | HR 59 | Temp 97.8°F | Resp 18 | Ht 70.0 in | Wt 187.8 lb

## 2015-08-22 DIAGNOSIS — M79641 Pain in right hand: Secondary | ICD-10-CM

## 2015-08-22 DIAGNOSIS — G459 Transient cerebral ischemic attack, unspecified: Secondary | ICD-10-CM | POA: Diagnosis not present

## 2015-08-22 DIAGNOSIS — S60221D Contusion of right hand, subsequent encounter: Secondary | ICD-10-CM

## 2015-08-22 DIAGNOSIS — M25541 Pain in joints of right hand: Secondary | ICD-10-CM

## 2015-08-22 NOTE — Progress Notes (Signed)
      Chief Complaint:  Chief Complaint  Patient presents with  . Follow-up    for W/C injury. Right hand.     HPI: Justin [name] is a 82 y.o. male who reports to [facility] today complaining of right hand pain follow-up.  He has hypothenar eminence he has pain and throbbing pain on knuckles. He states it is intermittent since he had WC injury. HE did not have this before,  He has this injury in August , he is being seen by neuro and they are planning to do some studies but he wants an xray done to make sure nothing else is going on since this is WC He has been doing everything as before, just has pain along the top of his hand    Allergies  Allergen Reactions  . Prednisone     REACTION: RASH  . Inderal [Propranolol]     When taken for tremor it caused "streaks of pain " across forehead   ROS: The patient denies fevers, chills, night sweats, unintentional weight loss, chest pain, palpitations, wheezing, dyspnea on exertion, nausea, vomiting, abdominal pain, dysuria, hematuria, melena, numbness, weakness, or tingling.   All other systems have been reviewed and were otherwise negative with the exception of those mentioned in the HPI and as above.    PHYSICAL EXAM: Filed Vitals:   08/22/15 1555  BP: 120/70  Pulse: 59  Temp: 97.8 F (36.6 C)  Resp: 18   Body mass index is 26.95 kg/(m^2).   General: Alert, no acute distress HEENT:  Normocephalic, atraumatic, oropharynx patent. EOMI, PERRLA Cardiovascular:  Regular rate and rhythm, no rubs murmurs or gallops.  Respiratory: Clear to auscultation bilaterally.  No wheezes, rales, or rhonchi.  No cyanosis, no use of accessory musculature Abdominal: No organomegaly, abdomen is soft and non-tender, positive bowel sounds. No masses. Skin: No rashes. Neurologic: Facial musculature symmetric. Psychiatric: Patient acts appropriately throughout our interaction. Tender all over hand, Full ROM Sensation intact + radial pulses 5/5  strength Tender at top of hand at metacarpals Lymphatic: No cervical or submandibular lymphadenopathy Musculoskeletal: Gait intact. No edema, tenderness   LABS:    EKG/XRAY:   Primary read interpreted by Dr. Marin Comment at [facility].   ASSESSMENT/PLAN: Encounter Diagnoses  Name Primary?  . Contusion of right hand, subsequent encounter Yes  . Pain of right hand    Neg fx or dislocation on xray Fu prn    Gross sideeffects, risk and benefits, and alternatives of medications d/w patient. Patient is aware that all medications have potential sideeffects and we are unable to predict every sideeffect or drug-drug interaction that may occur.  Jaque Dacy DO  08/22/2015 5:08 PM

## 2015-08-22 NOTE — Progress Notes (Signed)
[facility] [address] [place]. [place] [address] [phone]       OFFICE CONSULT NOTE  [name] Date of Birth:  [birth year] Medical Record Number:  [id number]   Referring MD: Dr Linna Darner Reason for Referral:   Hand pain HPI: [name] is 82 year old Caucasian male who fell on a concrete floor in August 2016 and landed on his right hand and hit the back of his head. He did not loose consciousness. He he developed pain and swelling in his right hand and was diagnosed with a sprain. X-rays of the hand did not reveal any fracture. He has had persistent pain since then. He initially had some weakness of his right grip but after 3 or 4 days it improved. He was asked to wear a hand splint which he did not wear for long time. He denies any neck pain, radicular pain, tingling, numbness or burning. He has had a remote TIA in 2007 for which he had seen me. He is remained free from any neurovascular symptoms since then. He is tolerating aspirin 81 mg daily and also takes Zocor for his lipids. He states his last lipid profile was fine. He has not had any follow-up carotid ultrasound done for several years.  ROS:   14 system review of systems is positive for anemia, easy bruising, easy bleeding, joint pain, cramps, runny nose, incontinence, constipation, itching, most, hearing loss, memory loss, confusion, not enough sleep, sleepiness and all other systems negative PMH:  Past Medical History  Diagnosis Date  . Anemia   . Chronic kidney disease   . Hyperlipidemia   . Basal cell cancer     Dr Wilhemina Bonito  . Diverticulosis   . Transient ischemic attack     Dr Leonie Man  . Benign prostatic hypertrophy     Dr Amalia Hailey  . Kidney stones   . Stroke ([facility])   . Colon polyp     Social History:  Social History   Social History  . Marital Status: Married    Spouse Name: N/A  . Number of Children: 4  . Years of Education: N/A   Occupational History  . Truck driver    Social  History Main Topics  . Smoking status: Former Smoker    Types: Cigarettes, Pipe, Cigars    Quit date: 07/23/1976  . Smokeless tobacco: Former Systems developer    Types: Chew     Comment: smoked [phone], up to < 1 ppd  . Alcohol Use: 0.6 oz/week    1 Glasses of wine per week     Comment: rare wine  . Drug Use: No  . Sexual Activity: Not on file   Other Topics Concern  . Not on file   Social History Narrative    Medications:   Current Outpatient Prescriptions on File Prior to Visit  Medication Sig Dispense Refill  . aspirin 81 MG tablet Take 81 mg by mouth daily.    . finasteride (PROSCAR) 5 MG tablet Take 5 mg by mouth daily.      . simvastatin (ZOCOR) 20 MG tablet TAKE 1 TABLET (20 MG TOTAL) BY MOUTH AT BEDTIME. 90 tablet 3   Current Facility-Administered Medications on File Prior to Visit  Medication Dose Route Frequency Provider Last Rate Last Dose  . cyanocobalamin ((VITAMIN B-12)) injection 1,000 mcg  1,000 mcg Intramuscular Once Hendricks Limes, MD        Allergies:   Allergies  Allergen Reactions  . Prednisone  REACTION: RASH  . Inderal [Propranolol]     When taken for tremor it caused "streaks of pain " across forehead    Physical Exam General: well developed, well nourished elderly Caucasian male, seated, in no evident distress Head: head normocephalic and atraumatic.   Neck: supple with no carotid or supraclavicular bruits Cardiovascular: regular rate and rhythm, no murmurs Musculoskeletal: no deformity .slight point tenderness over the dorsal aspect of the right hand over the second and third metatarsals. Skin:  no rash/petichiae Vascular:  Normal pulses all extremities  Neurologic Exam Mental Status: Awake and fully alert. Oriented to place and time. Recent and remote memory intact. Attention span, concentration and fund of knowledge appropriate. Mood and affect appropriate.  Cranial Nerves: Fundoscopic exam reveals sharp disc margins. Pupils equal, briskly  reactive to light. Extraocular movements full without nystagmus. Visual fields full to confrontation. Hearing intact. Facial sensation intact. Face, tongue, palate moves normally and symmetrically.  Motor: Normal bulk and tone. Normal strength in all tested extremity muscles. Sensory.: intact to touch , pinprick , position and vibratory sensation.  Coordination: Rapid alternating movements normal in all extremities. Finger-to-nose and heel-to-shin performed accurately bilaterally. Gait and Station: Arises from chair without difficulty. Stance is normal. Gait demonstrates normal stride length and balance . Unable to heel, toe and tandem walk without difficulty.  Reflexes: 1+ and symmetric. Toes downgoing.       ASSESSMENT: 82 year old Caucasian male with right hand pain following a fall in August 2016 likely of musculoskeletal nature. Entrapment neuropathy of the branch of ulnar nerve is less likely. Remote history of TIA but stable from neurovascular standpoint    PLAN: I had a long discussion with the patient and his wife regarding his right hand pain following his fall which is likely muscular in nature but given point tenderness on exam I feel that repeating x-rays of the right hand is necessary to rule out any bony pathology. Check EMG nerve conduction study to further evaluation for underlying nerve injury. Continue aspirin for stroke prevention and strict control of lipids with LDL cholesterol goal below 70 mg percent. Check screening follow-up carotid ultrasound study. Patient was advised to return for follow-up in the future only as necessary and no routine follow-up appointment was made.   Justin Contras, MD  Note: This document was prepared with digital dictation and possible smart phrase technology. Any transcriptional errors that result from this process are unintentional.

## 2015-08-22 NOTE — Patient Instructions (Signed)
I had a long discussion with the patient and his wife regarding his right hand pain following his fall which is likely muscular in nature but given point tenderness on exam I feel that repeating x-rays of the right hand is necessary to rule out any bony pathology. Check EMG nerve conduction study to further evaluation for underlying nerve injury. Continue aspirin for stroke prevention and strict control of lipids with LDL cholesterol goal below 70 mg percent. Check screening follow-up carotid ultrasound study. Patient was advised to return for follow-up in the future only as necessary and no routine follow-up appointment was made.

## 2015-08-22 NOTE — Patient Instructions (Signed)
Because you received an x-ray today, you will receive an invoice from [facility]. Please contact [facility] at [phone] with questions or concerns regarding your invoice. Our billing staff will not be able to assist you with those questions. °

## 2015-08-30 ENCOUNTER — Telehealth: Payer: Self-pay | Admitting: Neurology

## 2015-08-30 NOTE — Telephone Encounter (Signed)
Called and left message for patient relaying The office will call him back in a few weeks spoke to Dr. Leonie [name] he is ok to wait to have Doppler here at [facility].

## 2015-08-31 ENCOUNTER — Ambulatory Visit: Payer: Medicare Other

## 2015-09-01 ENCOUNTER — Ambulatory Visit (INDEPENDENT_AMBULATORY_CARE_PROVIDER_SITE_OTHER): Payer: Medicare Other | Admitting: General Practice

## 2015-09-01 DIAGNOSIS — E539 Vitamin B deficiency, unspecified: Secondary | ICD-10-CM

## 2015-09-01 MED ORDER — CYANOCOBALAMIN 1000 MCG/ML IJ SOLN
1000.0000 ug | Freq: Once | INTRAMUSCULAR | Status: AC
  Administered 2015-09-01: 1000 ug via INTRAMUSCULAR

## 2015-09-15 ENCOUNTER — Encounter: Payer: Self-pay | Admitting: Neurology

## 2015-09-15 ENCOUNTER — Other Ambulatory Visit: Payer: Self-pay

## 2015-09-21 ENCOUNTER — Ambulatory Visit (INDEPENDENT_AMBULATORY_CARE_PROVIDER_SITE_OTHER): Payer: Medicare Other

## 2015-09-21 DIAGNOSIS — G459 Transient cerebral ischemic attack, unspecified: Secondary | ICD-10-CM

## 2015-09-22 ENCOUNTER — Other Ambulatory Visit: Payer: Self-pay

## 2015-10-19 ENCOUNTER — Ambulatory Visit (INDEPENDENT_AMBULATORY_CARE_PROVIDER_SITE_OTHER): Payer: Self-pay | Admitting: Neurology

## 2015-10-19 ENCOUNTER — Encounter: Payer: Self-pay | Admitting: Neurology

## 2015-10-19 ENCOUNTER — Telehealth: Payer: Self-pay

## 2015-10-19 ENCOUNTER — Ambulatory Visit (INDEPENDENT_AMBULATORY_CARE_PROVIDER_SITE_OTHER): Payer: Medicare Other | Admitting: Neurology

## 2015-10-19 DIAGNOSIS — G5601 Carpal tunnel syndrome, right upper limb: Secondary | ICD-10-CM

## 2015-10-19 DIAGNOSIS — G5600 Carpal tunnel syndrome, unspecified upper limb: Secondary | ICD-10-CM

## 2015-10-19 DIAGNOSIS — M25541 Pain in joints of right hand: Secondary | ICD-10-CM

## 2015-10-19 HISTORY — DX: Carpal tunnel syndrome, unspecified upper limb: G56.00

## 2015-10-19 NOTE — Progress Notes (Signed)
Please refer to EMG and nerve conduction study procedure note. 

## 2015-10-19 NOTE — Telephone Encounter (Signed)
Pt is needing to get a copy of his right wrist xray to take to his appt this afternoon    Best number [phone]

## 2015-10-19 NOTE — Procedures (Signed)
     HISTORY:  Justin [name] is an 82 year old gentleman with a history of a fall that occurred on 02/25/2015. The patient injured his right hand, he has had persistent discomfort in the hand without numbness since that time. He does report some neck discomfort. He is being evaluated for this issue.  NERVE CONDUCTION STUDIES:  Nerve conduction studies were performed on both upper extremities. The distal motor latency for the right median nerve was prolonged, normal on the left. The motor amplitudes for these nerves were normal bilaterally. The distal motor latencies and motor amplitudes for the ulnar nerves were normal bilaterally. The F wave latencies and nerve conduction velocities for the median and ulnar nerves were normal bilaterally. The sensory latencies for the median nerves were prolonged on the right, normal on the left, normal for the ulnar nerves bilaterally.  EMG STUDIES:  EMG study was performed on the right upper extremity:  The first dorsal interosseous muscle reveals 2 to 5 K units with full recruitment. No fibrillations or positive waves were noted. The abductor pollicis brevis muscle reveals 2 to 5 K units with full recruitment. No fibrillations or positive waves were noted. The extensor indicis proprius muscle reveals 1 to 3 K units with full recruitment. No fibrillations or positive waves were noted. The pronator teres muscle reveals 2 to 3 K units with full recruitment. No fibrillations or positive waves were noted. The biceps muscle reveals 1 to 2 K units with full recruitment. No fibrillations or positive waves were noted. The triceps muscle reveals 2 to 4 K units with full recruitment. No fibrillations or positive waves were noted. The anterior deltoid muscle reveals 2 to 3 K units with full recruitment. No fibrillations or positive waves were noted. The cervical paraspinal muscles were tested at 2 levels. No abnormalities of insertional activity were seen at either  level tested. There was fair relaxation.   IMPRESSION:  Nerve conduction studies done on both upper extremities shows evidence of a mild right carpal tunnel syndrome. EMG evaluation of the right upper extremity does not show evidence of an overlying cervical radiculopathy.  Jill Alexanders MD 10/19/2015 5:30 PM  [facility] [address] [place], [place] [address]  Phone [phone] Fax [phone]

## 2015-10-20 NOTE — Telephone Encounter (Signed)
At the patient's request, I made a copy of his x-ray on CD , and gave it to the patient

## 2015-10-25 ENCOUNTER — Ambulatory Visit (INDEPENDENT_AMBULATORY_CARE_PROVIDER_SITE_OTHER): Payer: Medicare Other | Admitting: Internal Medicine

## 2015-10-25 ENCOUNTER — Other Ambulatory Visit: Payer: Self-pay | Admitting: Internal Medicine

## 2015-10-25 ENCOUNTER — Other Ambulatory Visit (INDEPENDENT_AMBULATORY_CARE_PROVIDER_SITE_OTHER): Payer: Medicare Other

## 2015-10-25 ENCOUNTER — Encounter: Payer: Self-pay | Admitting: Internal Medicine

## 2015-10-25 VITALS — BP 136/70 | HR 59 | Temp 98.1°F | Resp 16 | Wt 184.0 lb

## 2015-10-25 DIAGNOSIS — N138 Other obstructive and reflux uropathy: Secondary | ICD-10-CM

## 2015-10-25 DIAGNOSIS — E039 Hypothyroidism, unspecified: Secondary | ICD-10-CM | POA: Insufficient documentation

## 2015-10-25 DIAGNOSIS — E782 Mixed hyperlipidemia: Secondary | ICD-10-CM

## 2015-10-25 DIAGNOSIS — E538 Deficiency of other specified B group vitamins: Secondary | ICD-10-CM | POA: Diagnosis not present

## 2015-10-25 DIAGNOSIS — N401 Enlarged prostate with lower urinary tract symptoms: Secondary | ICD-10-CM | POA: Diagnosis not present

## 2015-10-25 DIAGNOSIS — Z8673 Personal history of transient ischemic attack (TIA), and cerebral infarction without residual deficits: Secondary | ICD-10-CM

## 2015-10-25 DIAGNOSIS — Z0000 Encounter for general adult medical examination without abnormal findings: Secondary | ICD-10-CM

## 2015-10-25 DIAGNOSIS — E038 Other specified hypothyroidism: Secondary | ICD-10-CM

## 2015-10-25 LAB — COMPREHENSIVE METABOLIC PANEL
ALT: 14 U/L (ref 0–53)
AST: 18 U/L (ref 0–37)
Albumin: 4.1 g/dL (ref 3.5–5.2)
Alkaline Phosphatase: 72 U/L (ref 39–117)
BUN: 16 mg/dL (ref 6–23)
CO2: 32 mEq/L (ref 19–32)
Calcium: 9 mg/dL (ref 8.4–10.5)
Chloride: 104 mEq/L (ref 96–112)
Creatinine, Ser: 0.8 mg/dL (ref 0.40–1.50)
GFR: 96.88 mL/min (ref >60.00)
Glucose, Bld: 93 mg/dL (ref 70–99)
Potassium: 3.6 mEq/L (ref 3.5–5.1)
Sodium: 140 mEq/L (ref 135–145)
Total Bilirubin: 0.6 mg/dL (ref 0.2–1.2)
Total Protein: 7.7 g/dL (ref 6.0–8.3)

## 2015-10-25 LAB — CBC WITH DIFFERENTIAL/PLATELET
Basophils Absolute: 0 10*3/uL (ref 0.0–0.1)
Basophils Relative: 0.7 % (ref 0.0–3.0)
Eosinophils Absolute: 0.1 10*3/uL (ref 0.0–0.7)
Eosinophils Relative: 2 % (ref 0.0–5.0)
HCT: 38.8 % — ABNORMAL LOW (ref 39.0–52.0)
Hemoglobin: 13.4 g/dL (ref 13.0–17.0)
Lymphocytes Relative: 18 % (ref 12.0–46.0)
Lymphs Abs: 1.2 10*3/uL (ref 0.7–4.0)
MCHC: 34.5 g/dL (ref 30.0–36.0)
MCV: 93.9 fl (ref 78.0–100.0)
Monocytes Absolute: 0.7 10*3/uL (ref 0.1–1.0)
Monocytes Relative: 10.5 % (ref 3.0–12.0)
Neutro Abs: 4.6 10*3/uL (ref 1.4–7.7)
Neutrophils Relative %: 68.8 % (ref 43.0–77.0)
Platelets: 211 10*3/uL (ref 150.0–400.0)
RBC: 4.14 Mil/uL — ABNORMAL LOW (ref 4.22–5.81)
RDW: 13.6 % (ref 11.5–15.5)
WBC: 6.7 10*3/uL (ref 4.0–10.5)

## 2015-10-25 LAB — TSH: TSH: 5.53 u[IU]/mL — ABNORMAL HIGH (ref 0.35–4.50)

## 2015-10-25 LAB — LIPID PANEL
Cholesterol: 140 mg/dL (ref 0–200)
HDL: 50.6 mg/dL (ref >39.00)
LDL Cholesterol: 65 mg/dL (ref 0–99)
NonHDL: 89.02
Total CHOL/HDL Ratio: 3
Triglycerides: 122 mg/dL (ref 0.0–149.0)
VLDL: 24.4 mg/dL (ref 0.0–40.0)

## 2015-10-25 MED ORDER — CYANOCOBALAMIN 1000 MCG/ML IJ SOLN
1000.0000 ug | INTRAMUSCULAR | Status: DC
  Administered 2015-10-25: 1000 ug via INTRAMUSCULAR

## 2015-10-25 NOTE — Assessment & Plan Note (Signed)
Taking proscar daily

## 2015-10-25 NOTE — Assessment & Plan Note (Signed)
On statin and asa 81

## 2015-10-25 NOTE — Assessment & Plan Note (Signed)
On simvastatin 20 mg daily Check lipid panel 

## 2015-10-25 NOTE — Assessment & Plan Note (Addendum)
B12 injection today and monthly 

## 2015-10-25 NOTE — Progress Notes (Signed)
Subjective:    Patient ID: Justin [name], male    DOB: Feb 01, 1925, 82 y.o.   MRN: [id number]  HPI Here for medicare wellness exam/physical exam.   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  He drives a few times a month.   I have personally reviewed and have noted 1.The patient's medical and social history 2.Their use of alcohol, tobacco or illicit drugs 3.Their current medications and supplements 4.The patient's functional ability including ADL's, fall risks, home safety risks and                 hearing or visual impairment. 5.Diet and physical activities 6.Evidence for depression or mood disorders    Are there smokers in your home (other than you)? No  Risk Factors Exercise: active, no regular exercise Dietary issues discussed: eats everything, does not refrain from eating anyting  Cardiac risk factors: advanced age (older than 64 for men, 82 for women), hyperlipidemia  Depression Screen  Have you felt down, depressed or hopeless? No  Have you felt little interest or pleasure in doing things?  No  Activities of Daily Living In your present state of health, do you have any difficulty performing the following activities?:  Driving? No Managing money?  No Feeding yourself? No Getting from bed to chair? No Climbing a flight of stairs? No Preparing food and eating?: No Bathing or showering? No Getting dressed: No Getting to/using the toilet? No Moving around from place to place: No In the past year have you fallen or had a near fall?: fall 2016, slipped on a wet floor, sprained right wrist   Are you sexually active?  No  Do you have more than one partner?  N/A  Hearing Difficulties: No Do you often ask people to speak up or repeat themselves? No Do you experience ringing or noises in your ears? No Do you have difficulty understanding soft or whispered voices? No Vision:              Any change in  vision:  No              Up to date with eye exam:  yes  Memory:  Do you feel that you have a problem with memory? No  Do you often misplace items? No  Do you feel safe at home?  Yes  Cognitive Testing  Alert, Orientated? Yes  Normal Appearance? Yes  Recall of three objects?  Yes  Can perform simple calculations? Yes  Displays appropriate judgment? Yes  Can read the correct time from a watch face? Yes   Advanced Directives have been discussed with the patient? Yes - in place  Medications and allergies reviewed with patient and updated if appropriate.  Patient Active Problem List   Diagnosis Date Noted  . Carpal tunnel syndrome 10/19/2015  . Pain, joint, hand, right 08/22/2015  . B12 deficiency 12/31/2011  . Benign prostatic hyperplasia with urinary obstruction 09/25/2011  . SKIN CANCER, HX OF 03/31/2010  . DIVERTICULOSIS, COLON 05/10/2008  . Hx of TIA (transient ischemic attack) and stroke 05/10/2008  . History of colonic polyps 05/10/2008  . NEPHROLITHIASIS, HX OF 05/10/2008  . HYPERLIPIDEMIA 09/24/2007  . Anemia 12/25/2006    Current Outpatient Prescriptions on File Prior to Visit  Medication Sig Dispense Refill  . aspirin 81 MG tablet Take 81 mg by mouth daily.    . finasteride (PROSCAR) 5 MG tablet Take 5 mg by mouth daily.      . simvastatin (  ZOCOR) 20 MG tablet TAKE 1 TABLET (20 MG TOTAL) BY MOUTH AT BEDTIME. 90 tablet 3   Current Facility-Administered Medications on File Prior to Visit  Medication Dose Route Frequency Provider Last Rate Last Dose  . cyanocobalamin ((VITAMIN B-12)) injection 1,000 mcg  1,000 mcg Intramuscular Once Hendricks Limes, MD        Past Medical History  Diagnosis Date  . Anemia   . Chronic kidney disease   . Hyperlipidemia   . Basal cell cancer     Dr Wilhemina Bonito  . Diverticulosis   . Transient ischemic attack     Dr Leonie Man  . Benign prostatic hypertrophy     Dr Amalia Hailey  . Kidney stones   . Stroke ([place])   . Colon polyp   . Carpal  tunnel syndrome 10/19/2015    Right    Past Surgical History  Procedure Laterality Date  . Lithotripsy       X 2;DrAmalia Hailey  . Hemorrhoid surgery    . Colonoscopy w/ polypectomy  01/2009    adenomatous, Dr. Alben Spittle  . Cataract extraction, bilateral    . Prostate biopsy      X2    Social History   Social History  . Marital Status: Married    Spouse Name: N/A  . Number of Children: 4  . Years of Education: N/A   Occupational History  . Truck driver    Social History Main Topics  . Smoking status: Former Smoker    Types: Cigarettes, Pipe, Cigars    Quit date: 07/23/1976  . Smokeless tobacco: Former Systems developer    Types: Chew     Comment: smoked [phone], up to < 1 ppd  . Alcohol Use: 0.6 oz/week    1 Glasses of wine per week     Comment: rare wine  . Drug Use: No  . Sexual Activity: Not Asked   Other Topics Concern  . None   Social History Narrative    Family History  Problem Relation Age of Onset  . Heart failure Father   . Heart attack Father 57  . Diabetes Mother   . Colon cancer Mother   . Prostate cancer Brother   . Stroke Brother   . Coronary artery disease Brother     7 vessel CBAG  . Diabetes Brother   . Heart attack      2 P uncles , both > 55  . Stroke Brother     in late 45s  . Colon polyps Sister   . Heart attack Brother     Review of Systems  Constitutional: Negative for fever, chills, appetite change and unexpected weight change.  HENT: Negative for hearing loss.   Eyes: Negative for visual disturbance.  Respiratory: Negative for cough, shortness of breath and wheezing.   Cardiovascular: Negative for chest pain, palpitations and leg swelling.  Gastrointestinal: Negative for abdominal pain.       Gerd on occasion  Genitourinary: Negative for dysuria and hematuria.  Neurological: Negative for light-headedness and headaches.  Psychiatric/Behavioral: Negative for dysphoric mood. The patient is not nervous/anxious.        Objective:    Filed Vitals:   10/25/15 0856  BP: 136/70  Pulse: 59  Temp: 98.1 F (36.7 C)  Resp: 16   Filed Weights   10/25/15 0856  Weight: 184 lb (83.462 kg)   Body mass index is 26.4 kg/(m^2).   Physical Exam Constitutional: He appears well-developed and well-nourished. No distress.  HENT:  Head: Normocephalic and atraumatic.  Right Ear: External ear normal.  Left Ear: External ear normal.  Mouth/Throat: Oropharynx is clear and moist.  Normal ear canals and TM b/l  Eyes: Conjunctivae and EOM are normal.  Neck: Neck supple. No tracheal deviation present. No thyromegaly present.  No carotid bruit  Cardiovascular: Normal rate, regular rhythm, normal heart sounds and intact distal pulses.   No murmur heard. Pulmonary/Chest: Effort normal and breath sounds normal. No respiratory distress. He has no wheezes. He has no rales.  Abdominal: Soft. Bowel sounds are normal. He exhibits no distension. There is no tenderness.  Genitourinary: deferred  Musculoskeletal: He exhibits no edema.  Lymphadenopathy:    He has no cervical adenopathy.  Skin: Skin is warm and dry. He is not diaphoretic.  Psychiatric: He has a normal mood and affect. His behavior is normal.         Assessment & Plan:   Wellness Exam Immunizations - deferred shingles and prevnar Colonoscopy - not needed at his age Eye exam -  Up to date Hearing loss - none Memory concerns/difficulties - no concerns Independent of ADLs - fully independent   Physical exam: Screening blood work - ordered Immunizations - - deferred shingles and prevnar Colonoscopy - not needed at his age Eye exams - up to date EKG - normal EKG in 2013 Exercise - active, but no regular exercise Weight, slightly overweight, plans on losing a few pounds Substance abuse - none  See Problem List for Assessment and Plan of chronic medical problems.  F/u annually

## 2015-10-25 NOTE — Patient Instructions (Addendum)
  [name] , Thank you for taking time to come for your Medicare Wellness Visit. I appreciate your ongoing commitment to your health goals. Please review the following plan we discussed and let me know if I can assist you in the future.   These are the goals we discussed: Goals    None      This is a list of the screening recommended for you and due dates:  Health Maintenance  Topic Date Due  . Shingles Vaccine  06/14/1987  . Pneumonia vaccines (1 of 2 - PCV13) 06/13/1992  . Flu Shot  02/21/2016  . Tetanus Vaccine  12/04/2022       Test(s) ordered today. Your results will be released to [facility] (or called to you) after review, usually within 72hours after test completion. If any changes need to be made, you will be notified at that same time.  All other Health Maintenance issues reviewed.   All recommended immunizations and age-appropriate screenings are up-to-date or discussed.  No immunizations administered today.   Medications reviewed and updated.  No changes recommended at this time.  Your prescription(s) have been submitted to your pharmacy. Please take as directed and contact our office if you believe you are having problem(s) with the medication(s).   Please followup annually

## 2015-10-25 NOTE — Progress Notes (Signed)
Pre visit review using our clinic review tool, if applicable. No additional management support is needed unless otherwise documented below in the visit note. 

## 2015-10-25 NOTE — Assessment & Plan Note (Signed)
Taking proscar

## 2015-10-26 ENCOUNTER — Telehealth: Payer: Self-pay | Admitting: Internal Medicine

## 2015-10-26 NOTE — Telephone Encounter (Signed)
Patient states he had call today.  He thought it was in regards to his labs.  Please return patient call.

## 2015-10-27 NOTE — Telephone Encounter (Signed)
Spoke with pt's wife to inform.  

## 2015-12-08 ENCOUNTER — Ambulatory Visit: Payer: Medicare Other | Admitting: Geriatric Medicine

## 2015-12-08 DIAGNOSIS — E538 Deficiency of other specified B group vitamins: Secondary | ICD-10-CM

## 2015-12-08 MED ORDER — CYANOCOBALAMIN 1000 MCG/ML IJ SOLN
1000.0000 ug | Freq: Once | INTRAMUSCULAR | Status: AC
  Administered 2015-12-08: 1000 ug via INTRAMUSCULAR

## 2015-12-08 NOTE — Progress Notes (Unsigned)
   Subjective:    Patient ID: Justin [name], male    DOB: Feb 25, 1925, 82 y.o.   MRN: [id number]  HPI    Review of Systems     Objective:   Physical Exam        Assessment & Plan:

## 2015-12-09 ENCOUNTER — Encounter: Payer: Self-pay | Admitting: Family Medicine

## 2015-12-09 ENCOUNTER — Ambulatory Visit (INDEPENDENT_AMBULATORY_CARE_PROVIDER_SITE_OTHER): Payer: Medicare Other | Admitting: Family Medicine

## 2015-12-09 VITALS — BP 132/70 | HR 74 | Temp 97.8°F | Ht 70.0 in | Wt 182.8 lb

## 2015-12-09 DIAGNOSIS — J40 Bronchitis, not specified as acute or chronic: Secondary | ICD-10-CM

## 2015-12-09 DIAGNOSIS — J302 Other seasonal allergic rhinitis: Secondary | ICD-10-CM

## 2015-12-09 MED ORDER — AZITHROMYCIN 250 MG PO TABS: ORAL_TABLET | ORAL | Status: DC

## 2015-12-09 MED ORDER — FLUTICASONE PROPIONATE 50 MCG/ACT NA SUSP: 2.0000 | Freq: Every day | NASAL | Status: DC

## 2015-12-09 NOTE — Progress Notes (Signed)
  Subjective:     Justin [name] is a 82 y.o. male who presents for evaluation of sore throat. Associated symptoms include yellow nasal discharge, pain while swallowing, post nasal drip, productive cough, sore throat and swollen glands. Onset of symptoms was 7 days ago, and have been gradually worsening since that time. He is drinking plenty of fluids. He has not had a recent close exposure to someone with proven streptococcal pharyngitis.  The following portions of the patient's history were reviewed and updated as appropriate:  He  has a past medical history of Anemia; Chronic kidney disease; Hyperlipidemia; Basal cell cancer; Diverticulosis; Transient ischemic attack; Benign prostatic hypertrophy; Kidney stones; Stroke [facility]); Colon polyp; and Carpal tunnel syndrome (10/19/2015). He  does not have any pertinent problems on file. He  has past surgical history that includes Lithotripsy; Hemorrhoid surgery; Colonoscopy w/ polypectomy (01/2009); Cataract extraction, bilateral; and Prostate biopsy. His family history includes Colon cancer in his mother; Colon polyps in his sister; Coronary artery disease in his brother; Diabetes in his brother and mother; Heart attack in his brother; Heart attack (age of onset: 22) in his father; Heart failure in his father; Prostate cancer in his brother; Stroke in his brother and brother. He  reports that he quit smoking about 39 years ago. His smoking use included Cigarettes, Pipe, and Cigars. He has quit using smokeless tobacco. His smokeless tobacco use included Chew. He reports that he drinks about 0.6 oz of alcohol per week. He reports that he does not use illicit drugs. He has a current medication list which includes the following prescription(s): aspirin, finasteride, simvastatin, azithromycin, and fluticasone, and the following Facility-Administered Medications: cyanocobalamin and cyanocobalamin. Current Outpatient Prescriptions on File Prior to Visit  Medication  Sig Dispense Refill  . aspirin 81 MG tablet Take 81 mg by mouth daily.    . finasteride (PROSCAR) 5 MG tablet Take 5 mg by mouth daily.      . simvastatin (ZOCOR) 20 MG tablet TAKE 1 TABLET (20 MG TOTAL) BY MOUTH AT BEDTIME. 90 tablet 3   Current Facility-Administered Medications on File Prior to Visit  Medication Dose Route Frequency Provider Last Rate Last Dose  . cyanocobalamin ((VITAMIN B-12)) injection 1,000 mcg  1,000 mcg Intramuscular Once Hendricks Limes, MD      . cyanocobalamin ((VITAMIN B-12)) injection 1,000 mcg  1,000 mcg Intramuscular Q30 days Binnie Rail, MD   1,000 mcg at 10/25/15 0915   He is allergic to prednisone and inderal..  Review of Systems Pertinent items are noted in HPI.    Objective:    BP 132/70 mmHg  Pulse 74  Temp(Src) 97.8 F (36.6 C) (Oral)  Ht 5\' 10"  (1.778 m)  Wt 182 lb 12.8 oz (82.918 kg)  BMI 26.23 kg/m2  SpO2 99% General appearance: alert, cooperative, appears stated age and no distress Ears: normal TM's and external ear canals both ears Nose: yellow discharge, moderate congestion, turbinates red, swollen, no sinus tenderness Throat: lips, mucosa, and tongue normal; teeth and gums normal Neck: no adenopathy, no carotid bruit, no JVD, supple, symmetrical, trachea midline and thyroid not enlarged, symmetric, no tenderness/mass/nodules Lungs: rhonchi LLL Heart: S1, S2 normal  Laboratory Strep test not done. Results:na .    Assessment:    Acute pharyngitis, likely  Viral pharyngitis.   Bronchitis Plan:    Patient placed on antibiotics. Follow up as needed. antihistamine and steroid nasal spray

## 2015-12-09 NOTE — Progress Notes (Signed)
Pre visit review using our clinic tool,if applicable. No additional management support is needed unless otherwise documented below in the visit note.  

## 2015-12-09 NOTE — Patient Instructions (Signed)

## 2015-12-13 ENCOUNTER — Ambulatory Visit (HOSPITAL_BASED_OUTPATIENT_CLINIC_OR_DEPARTMENT_OTHER)
Admission: RE | Admit: 2015-12-13 | Discharge: 2015-12-13 | Disposition: A | Discharge status: Home / Self Care | Payer: Medicare Other | Source: Ambulatory Visit | Attending: Family Medicine | Admitting: Family Medicine

## 2015-12-13 ENCOUNTER — Encounter: Payer: Self-pay | Admitting: Family Medicine

## 2015-12-13 ENCOUNTER — Ambulatory Visit (INDEPENDENT_AMBULATORY_CARE_PROVIDER_SITE_OTHER): Payer: Medicare Other | Admitting: Family Medicine

## 2015-12-13 VITALS — BP 118/60 | HR 60 | Temp 97.7°F | Ht 70.0 in | Wt 184.4 lb

## 2015-12-13 DIAGNOSIS — J029 Acute pharyngitis, unspecified: Secondary | ICD-10-CM | POA: Diagnosis not present

## 2015-12-13 DIAGNOSIS — J209 Acute bronchitis, unspecified: Secondary | ICD-10-CM | POA: Diagnosis present

## 2015-12-13 DIAGNOSIS — R05 Cough: Secondary | ICD-10-CM

## 2015-12-13 DIAGNOSIS — R918 Other nonspecific abnormal finding of lung field: Secondary | ICD-10-CM | POA: Diagnosis not present

## 2015-12-13 DIAGNOSIS — R059 Cough, unspecified: Secondary | ICD-10-CM

## 2015-12-13 MED ORDER — AMOXICILLIN-POT CLAVULANATE 875-125 MG PO TABS
1.0000 | ORAL_TABLET | Freq: Two times a day (BID) | ORAL | Status: DC
Start: 2015-12-13 — End: 2016-04-23

## 2015-12-13 MED ORDER — METHYLPREDNISOLONE ACETATE 80 MG/ML IJ SUSP
80.0000 mg | Freq: Once | INTRAMUSCULAR | Status: AC
  Administered 2015-12-13: 80 mg via INTRAMUSCULAR

## 2015-12-13 MED ORDER — BENZONATATE 100 MG PO CAPS: ORAL_CAPSULE | ORAL | Status: DC

## 2015-12-13 NOTE — Progress Notes (Signed)
Subjective:     Justin [name] is a 82 y.o. male here for evaluation of a cough. Onset of symptoms was 10 days ago. Symptoms have been gradually worsening since that time. The cough is productive and is aggravated by nothing. Associated symptoms include: postnasal drip, shortness of breath and sputum production. Patient does not have a history of asthma. Patient does have a history of environmental allergens. Patient has not traveled recently. Patient does have a history of smoking. Patient has not had a previous chest x-ray. Patient has not had a PPD done.  The following portions of the patient's history were reviewed and updated as appropriate:  He  has a past medical history of Anemia; Chronic kidney disease; Hyperlipidemia; Basal cell cancer; Diverticulosis; Transient ischemic attack; Benign prostatic hypertrophy; Kidney stones; Stroke [facility]); Colon polyp; and Carpal tunnel syndrome (10/19/2015). He  does not have any pertinent problems on file. He  has past surgical history that includes Lithotripsy; Hemorrhoid surgery; Colonoscopy w/ polypectomy (01/2009); Cataract extraction, bilateral; and Prostate biopsy. His family history includes Colon cancer in his mother; Colon polyps in his sister; Coronary artery disease in his brother; Diabetes in his brother and mother; Heart attack in his brother; Heart attack (age of onset: 26) in his father; Heart failure in his father; Prostate cancer in his brother; Stroke in his brother and brother. He  reports that he quit smoking about 39 years ago. His smoking use included Cigarettes, Pipe, and Cigars. He has quit using smokeless tobacco. His smokeless tobacco use included Chew. He reports that he drinks about 0.6 oz of alcohol per week. He reports that he does not use illicit drugs. He has a current medication list which includes the following prescription(s): aspirin, cetirizine hcl, finasteride, fluticasone, simvastatin, amoxicillin-clavulanate, and  benzonatate, and the following Facility-Administered Medications: cyanocobalamin and cyanocobalamin. Current Outpatient Prescriptions on File Prior to Visit  Medication Sig Dispense Refill  . aspirin 81 MG tablet Take 81 mg by mouth daily.    . finasteride (PROSCAR) 5 MG tablet Take 5 mg by mouth daily.      . fluticasone (FLONASE) 50 MCG/ACT nasal spray Place 2 sprays into both nostrils daily. 16 g 6  . simvastatin (ZOCOR) 20 MG tablet TAKE 1 TABLET (20 MG TOTAL) BY MOUTH AT BEDTIME. 90 tablet 3   Current Facility-Administered Medications on File Prior to Visit  Medication Dose Route Frequency Provider Last Rate Last Dose  . cyanocobalamin ((VITAMIN B-12)) injection 1,000 mcg  1,000 mcg Intramuscular Once Hendricks Limes, MD      . cyanocobalamin ((VITAMIN B-12)) injection 1,000 mcg  1,000 mcg Intramuscular Q30 days Binnie Rail, MD   1,000 mcg at 10/25/15 0915   He is allergic to prednisone and inderal..  Review of Systems Pertinent items are noted in HPI.    Objective:    Oxygen saturation 98% on room air BP 118/60 mmHg  Pulse 60  Temp(Src) 97.7 F (36.5 C) (Oral)  Ht 5\' 10"  (1.778 m)  Wt 184 lb 6.4 oz (83.643 kg)  BMI 26.46 kg/m2  SpO2 98% General appearance: alert, cooperative, appears stated age and no distress Ears: normal TM's and external ear canals both ears Nose: Nares normal. Septum midline. Mucosa normal. No drainage or sinus tenderness. Throat: abnormal findings: mild oropharyngeal erythema Neck: mild anterior cervical adenopathy, supple, symmetrical, trachea midline and thyroid not enlarged, symmetric, no tenderness/mass/nodules Lungs: clear to auscultation bilaterally Heart: S1, S2 normal    Assessment:    pharyngitis  Plan:    Antibiotics per medication orders. Antitussives per medication orders. Avoid exposure to tobacco smoke and fumes. Call if shortness of breath worsens, blood in sputum, change in character of cough, development of fever or  chills, inability to maintain nutrition and hydration. Avoid exposure to tobacco smoke and fumes.

## 2015-12-13 NOTE — Patient Instructions (Signed)

## 2015-12-16 LAB — CULTURE, GROUP A STREP: Organism ID, Bacteria: NORMAL

## 2016-01-06 ENCOUNTER — Telehealth: Payer: Self-pay | Admitting: Internal Medicine

## 2016-01-06 NOTE — Telephone Encounter (Signed)
Justin [name] [id number]  - [employer]   [employer]    Received order from holding because they require a advance determination. The reason why is because pt is over the age of 27.

## 2016-01-06 NOTE — Telephone Encounter (Signed)
I called back to speak with Sonia Baller and she was gone for the day, they are working on a advanced determination and once it is complete, they will send him the kit. They have no turn around time, but will call us back if they need any further information.    KP

## 2016-01-09 ENCOUNTER — Other Ambulatory Visit: Payer: Medicare Other

## 2016-01-10 ENCOUNTER — Other Ambulatory Visit (INDEPENDENT_AMBULATORY_CARE_PROVIDER_SITE_OTHER): Payer: Medicare Other

## 2016-01-10 ENCOUNTER — Other Ambulatory Visit: Payer: Self-pay | Admitting: Family Medicine

## 2016-01-10 DIAGNOSIS — E785 Hyperlipidemia, unspecified: Secondary | ICD-10-CM | POA: Diagnosis not present

## 2016-01-10 DIAGNOSIS — R739 Hyperglycemia, unspecified: Secondary | ICD-10-CM

## 2016-01-10 DIAGNOSIS — R319 Hematuria, unspecified: Secondary | ICD-10-CM

## 2016-01-10 DIAGNOSIS — E538 Deficiency of other specified B group vitamins: Secondary | ICD-10-CM | POA: Diagnosis not present

## 2016-01-10 DIAGNOSIS — I10 Essential (primary) hypertension: Secondary | ICD-10-CM

## 2016-01-10 DIAGNOSIS — R8290 Unspecified abnormal findings in urine: Secondary | ICD-10-CM

## 2016-01-10 NOTE — Addendum Note (Signed)
Addended by: Harl Bowie on: 01/10/2016 05:31 PM   Modules accepted: Orders

## 2016-01-11 ENCOUNTER — Ambulatory Visit (INDEPENDENT_AMBULATORY_CARE_PROVIDER_SITE_OTHER): Payer: Medicare Other

## 2016-01-11 DIAGNOSIS — E538 Deficiency of other specified B group vitamins: Secondary | ICD-10-CM

## 2016-01-11 LAB — MICROALBUMIN / CREATININE URINE RATIO
Creatinine,U: 120.8 mg/dL
Microalb Creat Ratio: 1.1 mg/g (ref 0.0–30.0)
Microalb, Ur: 1.3 mg/dL (ref 0.0–1.9)

## 2016-01-11 LAB — CBC WITH DIFFERENTIAL/PLATELET
Basophils Absolute: 0 10*3/uL (ref 0.0–0.1)
Basophils Relative: 0.8 % (ref 0.0–3.0)
Eosinophils Absolute: 0.2 10*3/uL (ref 0.0–0.7)
Eosinophils Relative: 4 % (ref 0.0–5.0)
HCT: 35.4 % — ABNORMAL LOW (ref 39.0–52.0)
Hemoglobin: 12.1 g/dL — ABNORMAL LOW (ref 13.0–17.0)
Lymphocytes Relative: 23.3 % (ref 12.0–46.0)
Lymphs Abs: 1.3 10*3/uL (ref 0.7–4.0)
MCHC: 34 g/dL (ref 30.0–36.0)
MCV: 96 fl (ref 78.0–100.0)
Monocytes Absolute: 0.6 10*3/uL (ref 0.1–1.0)
Monocytes Relative: 11.2 % (ref 3.0–12.0)
Neutro Abs: 3.5 10*3/uL (ref 1.4–7.7)
Neutrophils Relative %: 60.7 % (ref 43.0–77.0)
Platelets: 157 10*3/uL (ref 150.0–400.0)
RBC: 3.69 Mil/uL — ABNORMAL LOW (ref 4.22–5.81)
RDW: 14.4 % (ref 11.5–15.5)
WBC: 5.7 10*3/uL (ref 4.0–10.5)

## 2016-01-11 LAB — LIPID PANEL
Cholesterol: 119 mg/dL (ref 0–200)
HDL: 50 mg/dL (ref >39.00)
LDL Cholesterol: 53 mg/dL (ref 0–99)
NonHDL: 68.76
Total CHOL/HDL Ratio: 2
Triglycerides: 78 mg/dL (ref 0.0–149.0)
VLDL: 15.6 mg/dL (ref 0.0–40.0)

## 2016-01-11 LAB — COMPREHENSIVE METABOLIC PANEL
ALT: 10 U/L (ref 0–53)
AST: 14 U/L (ref 0–37)
Albumin: 3.9 g/dL (ref 3.5–5.2)
Alkaline Phosphatase: 59 U/L (ref 39–117)
BUN: 20 mg/dL (ref 6–23)
CO2: 30 mEq/L (ref 19–32)
Calcium: 8.9 mg/dL (ref 8.4–10.5)
Chloride: 105 mEq/L (ref 96–112)
Creatinine, Ser: 0.86 mg/dL (ref 0.40–1.50)
GFR: 89.08 mL/min (ref >60.00)
Glucose, Bld: 85 mg/dL (ref 70–99)
Potassium: 3.7 mEq/L (ref 3.5–5.1)
Sodium: 141 mEq/L (ref 135–145)
Total Bilirubin: 0.5 mg/dL (ref 0.2–1.2)
Total Protein: 7.4 g/dL (ref 6.0–8.3)

## 2016-01-11 LAB — POCT URINALYSIS DIPSTICK
Bilirubin, UA: NEGATIVE
Glucose, UA: NEGATIVE
Ketones, UA: NEGATIVE
Leukocytes, UA: NEGATIVE
Nitrite, UA: NEGATIVE
Protein, UA: NEGATIVE
Spec Grav, UA: 1.025
Urobilinogen, UA: 0.2
pH, UA: 6

## 2016-01-11 LAB — VITAMIN B12: Vitamin B-12: 309 pg/mL (ref 211–911)

## 2016-01-11 LAB — HEMOGLOBIN A1C: Hgb A1c MFr Bld: 5.4 % (ref 4.6–6.5)

## 2016-01-11 MED ORDER — CYANOCOBALAMIN 1000 MCG/ML IJ SOLN
1000.0000 ug | Freq: Once | INTRAMUSCULAR | Status: AC
  Administered 2016-01-11: 1000 ug via INTRAMUSCULAR

## 2016-01-12 LAB — URINE CULTURE
Colony Count: NO GROWTH
Organism ID, Bacteria: NO GROWTH

## 2016-04-10 ENCOUNTER — Ambulatory Visit (INDEPENDENT_AMBULATORY_CARE_PROVIDER_SITE_OTHER): Payer: Medicare Other | Admitting: General Practice

## 2016-04-10 DIAGNOSIS — E538 Deficiency of other specified B group vitamins: Secondary | ICD-10-CM | POA: Diagnosis not present

## 2016-04-10 MED ORDER — CYANOCOBALAMIN 1000 MCG/ML IJ SOLN
1000.0000 ug | Freq: Once | INTRAMUSCULAR | Status: AC
  Administered 2016-04-10: 1000 ug via INTRAMUSCULAR

## 2016-04-10 NOTE — Progress Notes (Signed)
Injection given.   Bethenny Losee J Liona Wengert, MD  

## 2016-04-23 ENCOUNTER — Ambulatory Visit (INDEPENDENT_AMBULATORY_CARE_PROVIDER_SITE_OTHER): Payer: Medicare Other | Admitting: Family Medicine

## 2016-04-23 ENCOUNTER — Encounter: Payer: Self-pay | Admitting: Family Medicine

## 2016-04-23 ENCOUNTER — Ambulatory Visit (HOSPITAL_BASED_OUTPATIENT_CLINIC_OR_DEPARTMENT_OTHER)
Admission: RE | Admit: 2016-04-23 | Discharge: 2016-04-23 | Disposition: A | Discharge status: Home / Self Care | Payer: Medicare Other | Source: Ambulatory Visit | Attending: Family Medicine | Admitting: Family Medicine

## 2016-04-23 VITALS — BP 137/62 | HR 62 | Temp 97.6°F | Resp 18 | Ht 70.0 in | Wt 181.0 lb

## 2016-04-23 DIAGNOSIS — I700 Atherosclerosis of aorta: Secondary | ICD-10-CM | POA: Insufficient documentation

## 2016-04-23 DIAGNOSIS — K5730 Diverticulosis of large intestine without perforation or abscess without bleeding: Secondary | ICD-10-CM | POA: Diagnosis not present

## 2016-04-23 DIAGNOSIS — R109 Unspecified abdominal pain: Secondary | ICD-10-CM | POA: Insufficient documentation

## 2016-04-23 DIAGNOSIS — N200 Calculus of kidney: Secondary | ICD-10-CM | POA: Diagnosis not present

## 2016-04-23 DIAGNOSIS — R319 Hematuria, unspecified: Secondary | ICD-10-CM | POA: Diagnosis not present

## 2016-04-23 LAB — POCT URINALYSIS DIPSTICK
Bilirubin, UA: NEGATIVE
Glucose, UA: NEGATIVE
Ketones, UA: NEGATIVE
Leukocytes, UA: NEGATIVE
Nitrite, UA: NEGATIVE
Protein, UA: NEGATIVE
Spec Grav, UA: 1.015
Urobilinogen, UA: NEGATIVE
pH, UA: 6

## 2016-04-23 LAB — COMPREHENSIVE METABOLIC PANEL
ALT: 12 U/L (ref 0–53)
AST: 16 U/L (ref 0–37)
Albumin: 3.7 g/dL (ref 3.5–5.2)
Alkaline Phosphatase: 66 U/L (ref 39–117)
BUN: 19 mg/dL (ref 6–23)
CO2: 32 mEq/L (ref 19–32)
Calcium: 8.5 mg/dL (ref 8.4–10.5)
Chloride: 103 mEq/L (ref 96–112)
Creatinine, Ser: 0.78 mg/dL (ref 0.40–1.50)
GFR: 99.64 mL/min (ref >60.00)
Glucose, Bld: 95 mg/dL (ref 70–99)
Potassium: 3.4 mEq/L — ABNORMAL LOW (ref 3.5–5.1)
Sodium: 139 mEq/L (ref 135–145)
Total Bilirubin: 0.4 mg/dL (ref 0.2–1.2)
Total Protein: 7.6 g/dL (ref 6.0–8.3)

## 2016-04-23 LAB — CBC WITH DIFFERENTIAL/PLATELET
Basophils Absolute: 0 10*3/uL (ref 0.0–0.1)
Basophils Relative: 0.6 % (ref 0.0–3.0)
Eosinophils Absolute: 0.1 10*3/uL (ref 0.0–0.7)
Eosinophils Relative: 2.2 % (ref 0.0–5.0)
HCT: 36.3 % — ABNORMAL LOW (ref 39.0–52.0)
Hemoglobin: 12.6 g/dL — ABNORMAL LOW (ref 13.0–17.0)
Lymphocytes Relative: 21.7 % (ref 12.0–46.0)
Lymphs Abs: 1.3 10*3/uL (ref 0.7–4.0)
MCHC: 34.8 g/dL (ref 30.0–36.0)
MCV: 96 fl (ref 78.0–100.0)
Monocytes Absolute: 0.7 10*3/uL (ref 0.1–1.0)
Monocytes Relative: 11.7 % (ref 3.0–12.0)
Neutro Abs: 3.8 10*3/uL (ref 1.4–7.7)
Neutrophils Relative %: 63.8 % (ref 43.0–77.0)
Platelets: 203 10*3/uL (ref 150.0–400.0)
RBC: 3.79 Mil/uL — ABNORMAL LOW (ref 4.22–5.81)
RDW: 13.1 % (ref 11.5–15.5)
WBC: 5.9 10*3/uL (ref 4.0–10.5)

## 2016-04-23 MED ORDER — HYDROCODONE-ACETAMINOPHEN 5-325 MG PO TABS: 1.0000 | ORAL_TABLET | Freq: Four times a day (QID) | ORAL | Status: DC | PRN

## 2016-04-23 MED ORDER — HYDROCODONE-ACETAMINOPHEN 5-325 MG PO TABS: 1.0000 | ORAL_TABLET | Freq: Four times a day (QID) | ORAL | 0 refills | Status: DC | PRN

## 2016-04-23 NOTE — Progress Notes (Signed)
Pre visit review using our clinic review tool, if applicable. No additional management support is needed unless otherwise documented below in the visit note. 

## 2016-04-23 NOTE — Progress Notes (Signed)
Patient ID: Justin [name], male    DOB: 12-Jun-1925  Age: 82 y.o. MRN: [id number]    Subjective:  Subjective  HPI Justin [name] presents for L flank pain after lifting doors on a tractor trailor / mowing lawn.  No dysuria,  No falls.  Pt has a hx of kidney stones and states this does feel similar He sees a urologist regularly.   Review of Systems  Constitutional: Negative for appetite change, diaphoresis, fatigue and unexpected weight change.  Eyes: Negative for pain, redness and visual disturbance.  Respiratory: Negative for cough, chest tightness, shortness of breath and wheezing.   Cardiovascular: Negative for chest pain, palpitations and leg swelling.  Endocrine: Negative for cold intolerance, heat intolerance, polydipsia, polyphagia and polyuria.  Genitourinary: Positive for flank pain. Negative for difficulty urinating, dysuria and frequency.  Neurological: Negative for dizziness, light-headedness, numbness and headaches.    History Past Medical History:  Diagnosis Date  . Anemia   . Basal cell cancer    Dr Wilhemina Bonito  . Benign prostatic hypertrophy    Dr Amalia Hailey  . Carpal tunnel syndrome 10/19/2015   Right  . Chronic kidney disease   . Colon polyp   . Diverticulosis   . Hyperlipidemia   . Kidney stones   . Stroke ([place])   . Transient ischemic attack    Dr Leonie Man    He has a past surgical history that includes Lithotripsy; Hemorrhoid surgery; Colonoscopy w/ polypectomy (01/2009); Cataract extraction, bilateral; and ostate biopsy.   His family history includes Colon cancer in his mother; Colon polyps in his sister; Coronary artery disease in his brother; Diabetes in his brother and mother; Heart attack in his brother; Heart attack (age of onset: 89) in his father; Heart failure in his father; Prostate cancer in his brother; Stroke in his brother and brother.He reports that he quit smoking about 39 years ago. His smoking use included Cigarettes, Pipe, and Cigars. He has quit  using smokeless tobacco. His smokeless tobacco use included Chew. He reports that he drinks about 0.6 oz of alcohol per week . He reports that he does not use drugs.  Current Outpatient Prescriptions on File Prior to Visit  Medication Sig Dispense Refill  . aspirin 81 MG tablet Take 81 mg by mouth daily.    . finasteride (PROSCAR) 5 MG tablet Take 5 mg by mouth daily.      . simvastatin (ZOCOR) 20 MG tablet TAKE 1 TABLET (20 MG TOTAL) BY MOUTH AT BEDTIME. 90 tablet 3   Current Facility-Administered Medications on File Prior to Visit  Medication Dose Route Frequency Provider Last Rate Last Dose  . cyanocobalamin ((VITAMIN B-12)) injection 1,000 mcg  1,000 mcg Intramuscular Once Hendricks Limes, MD         Objective:  Objective  Physical Exam  Constitutional: He is oriented to person, place, and time. Vital signs are normal. He appears well-developed and well-nourished. He is sleeping.  HENT:  Head: Normocephalic and atraumatic.  Mouth/Throat: Oropharynx is clear and moist.  Eyes: EOM are normal. Pupils are equal, round, and reactive to light.  Neck: Normal range of motion. Neck supple. No thyromegaly present.  Cardiovascular: Normal rate and regular rhythm.   No murmur heard. Pulmonary/Chest: Effort normal and breath sounds normal. No respiratory distress. He has no wheezes. He has no rales. He exhibits no tenderness.  Musculoskeletal: He exhibits tenderness. He exhibits no edema.       Arms: Neurological: He is alert and oriented to person, place,  and time.  Skin: Skin is warm and dry.  Psychiatric: He has a normal mood and affect. His behavior is normal. Judgment and thought content normal.  Nursing note and vitals reviewed.  BP 137/62 (BP Location: Left Arm, Cuff Size: Normal)   Pulse 62   Temp 97.6 F (36.4 C) (Oral)   Resp 18   Ht 5\' 10"  (1.778 m)   Wt 181 lb (82.1 kg)   SpO2 100% Comment: room air  BMI 25.97 kg/m  Wt Readings from Last 3 Encounters:  04/23/16 181 lb  (82.1 kg)  12/13/15 184 lb 6.4 oz (83.6 kg)  12/09/15 182 lb 12.8 oz (82.9 kg)     Lab Results  Component Value Date   WBC 5.9 04/23/2016   HGB 12.6 (L) 04/23/2016   HCT 36.3 (L) 04/23/2016   PLT 203.0 04/23/2016   GLUCOSE 95 04/23/2016   CHOL 119 01/10/2016   TRIG 78.0 01/10/2016   HDL 50.00 01/10/2016   LDLCALC 53 01/10/2016   ALT 12 04/23/2016   AST 16 04/23/2016   NA 139 04/23/2016   K 3.4 (L) 04/23/2016   CL 103 04/23/2016   CREATININE 0.78 04/23/2016   BUN 19 04/23/2016   CO2 32 04/23/2016   TSH 5.53 (H) 10/25/2015   HGBA1C 5.4 01/10/2016   MICROALBUR 1.3 01/10/2016    Dg Chest 2 View  Result Date: 12/13/2015 CLINICAL DATA:  Cough and sore throat for a week, former smoker, acute bronchitis of unspecified organism EXAM: CHEST  2 VIEW COMPARISON:  01/26/2010 FINDINGS: Upper normal heart size. Mediastinal contours and pulmonary vascularity normal. Lungs appear hyperinflated with chronic interstitial prominence. No definite acute infiltrate, pleural effusion or pneumothorax. Dextro convex thoracolumbar scoliosis with mild degenerative disc disease changes thoracic spine No acute osseous findings. IMPRESSION: Hyperinflated lungs with chronic interstitial prominence. No acute infiltrate. Electronically Signed   By: Lavonia Dana M.D.   On: 12/13/2015 14:37     Assessment & Plan:  Plan  I have discontinued Justin [name] fluticasone, Cetirizine HCl (ZYRTEC ALLERGY PO), amoxicillin-clavulanate, and benzonatate. I am also having him start on HYDROcodone-acetaminophen. Additionally, I am having him maintain his finasteride, aspirin, and simvastatin. We will stop administering cyanocobalamin. Additionally, we will continue to administer cyanocobalamin.  Meds ordered this encounter  Medications  . DISCONTD: HYDROcodone-acetaminophen (NORCO/VICODIN) 5-325 MG per tablet 1 tablet  . HYDROcodone-acetaminophen (NORCO/VICODIN) 5-325 MG tablet    Sig: Take 1 tablet by mouth every 6 (six)  hours as needed for moderate pain.    Dispense:  30 tablet    Refill:  0    Problem List Items Addressed This Visit      Unprioritized   Left flank pain - Primary    Hx kidney stones  Strain urine Pain meds per orders Ct urogram If pain con't go to er F/u urology      Relevant Medications   HYDROcodone-acetaminophen (NORCO/VICODIN) 5-325 MG tablet   Other Relevant Orders   POCT urinalysis dipstick (Completed)   CT RENAL STONE STUDY (Completed)   Comprehensive metabolic panel (Completed)   CBC with Differential/Platelet (Completed)    Other Visit Diagnoses   None.     Follow-up: No Follow-up on file.  [name], DO

## 2016-04-23 NOTE — Patient Instructions (Signed)
Kidney Stones °Kidney stones (urolithiasis) are deposits that form inside your kidneys. The intense pain is caused by the stone moving through the urinary tract. When the stone moves, the ureter goes into spasm around the stone. The stone is usually passed in the urine.  °CAUSES  °· A disorder that makes certain neck glands produce too much parathyroid hormone (primary hyperparathyroidism). °· A buildup of uric acid crystals, similar to gout in your joints. °· Narrowing (stricture) of the ureter. °· A kidney obstruction present at birth (congenital obstruction). °· Previous surgery on the kidney or ureters. °· Numerous kidney infections. °SYMPTOMS  °· Feeling sick to your stomach (nauseous). °· Throwing up (vomiting). °· Blood in the urine (hematuria). °· Pain that usually spreads (radiates) to the groin. °· Frequency or urgency of urination. °DIAGNOSIS  °· Taking a history and physical exam. °· Blood or urine tests. °· CT scan. °· Occasionally, an examination of the inside of the urinary bladder (cystoscopy) is performed. °TREATMENT  °· Observation. °· Increasing your fluid intake. °· Extracorporeal shock wave lithotripsy--This is a noninvasive procedure that uses shock waves to break up kidney stones. °· Surgery may be needed if you have severe pain or persistent obstruction. There are various surgical procedures. Most of the procedures are performed with the use of small instruments. Only small incisions are needed to accommodate these instruments, so recovery time is minimized. °The size, location, and chemical composition are all important variables that will determine the proper choice of action for you. Talk to your health care provider to better understand your situation so that you will minimize the risk of injury to yourself and your kidney.  °HOME CARE INSTRUCTIONS  °· Drink enough water and fluids to keep your urine clear or pale yellow. This will help you to pass the stone or stone fragments. °· Strain  all urine through the provided strainer. Keep all particulate matter and stones for your health care provider to see. The stone causing the pain may be as small as a grain of salt. It is very important to use the strainer each and every time you pass your urine. The collection of your stone will allow your health care provider to analyze it and verify that a stone has actually passed. The stone analysis will often identify what you can do to reduce the incidence of recurrences. °· Only take over-the-counter or prescription medicines for pain, discomfort, or fever as directed by your health care provider. °· Keep all follow-up visits as told by your health care provider. This is important. °· Get follow-up X-rays if required. The absence of pain does not always mean that the stone has passed. It may have only stopped moving. If the urine remains completely obstructed, it can cause loss of kidney function or even complete destruction of the kidney. It is your responsibility to make sure X-rays and follow-ups are completed. Ultrasounds of the kidney can show blockages and the status of the kidney. Ultrasounds are not associated with any radiation and can be performed easily in a matter of minutes. °· Make changes to your daily diet as told by your health care provider. You may be told to: °¨ Limit the amount of salt that you eat. °¨ Eat 5 or more servings of fruits and vegetables each day. °¨ Limit the amount of meat, poultry, fish, and eggs that you eat. °· Collect a 24-hour urine sample as told by your health care provider. You may need to collect another urine sample every 6-12   months. °SEEK MEDICAL CARE IF: °· You experience pain that is progressive and unresponsive to any pain medicine you have been prescribed. °SEEK IMMEDIATE MEDICAL CARE IF:  °· Pain cannot be controlled with the prescribed medicine. °· You have a fever or shaking chills. °· The severity or intensity of pain increases over 18 hours and is not  relieved by pain medicine. °· You develop a new onset of abdominal pain. °· You feel faint or pass out. °· You are unable to urinate. °  °This information is not intended to replace advice given to you by your health care provider. Make sure you discuss any questions you have with your health care provider. °  °Document Released: 07/09/2005 Document Revised: 03/30/2015 Document Reviewed: 12/10/2012 °Elsevier Interactive Patient Education ©2016 [employer] ° °

## 2016-04-23 NOTE — Assessment & Plan Note (Signed)
Hx kidney stones  Strain urine Pain meds per orders Ct urogram If pain con't go to er F/u urology

## 2016-05-15 ENCOUNTER — Ambulatory Visit: Payer: Medicare Other

## 2016-05-16 ENCOUNTER — Ambulatory Visit (INDEPENDENT_AMBULATORY_CARE_PROVIDER_SITE_OTHER): Payer: Medicare Other

## 2016-05-16 DIAGNOSIS — E538 Deficiency of other specified B group vitamins: Secondary | ICD-10-CM

## 2016-05-16 MED ORDER — CYANOCOBALAMIN 1000 MCG/ML IJ SOLN
1000.0000 ug | Freq: Once | INTRAMUSCULAR | Status: AC
  Administered 2016-05-16: 1000 ug via INTRAMUSCULAR

## 2016-05-18 ENCOUNTER — Other Ambulatory Visit: Payer: Self-pay

## 2016-05-18 MED ORDER — SIMVASTATIN 20 MG PO TABS: ORAL_TABLET | ORAL | 1 refills | Status: DC

## 2016-06-08 ENCOUNTER — Encounter (HOSPITAL_BASED_OUTPATIENT_CLINIC_OR_DEPARTMENT_OTHER): Payer: Self-pay

## 2016-06-08 ENCOUNTER — Emergency Department (HOSPITAL_BASED_OUTPATIENT_CLINIC_OR_DEPARTMENT_OTHER): Payer: Medicare Other

## 2016-06-08 ENCOUNTER — Emergency Department (HOSPITAL_BASED_OUTPATIENT_CLINIC_OR_DEPARTMENT_OTHER)
Admission: EM | Admit: 2016-06-08 | Discharge: 2016-06-08 | Disposition: A | Discharge status: Home / Self Care | Payer: Medicare Other | Attending: Emergency Medicine | Admitting: Emergency Medicine

## 2016-06-08 ENCOUNTER — Ambulatory Visit (INDEPENDENT_AMBULATORY_CARE_PROVIDER_SITE_OTHER): Payer: Medicare Other | Admitting: Behavioral Health

## 2016-06-08 DIAGNOSIS — Y999 Unspecified external cause status: Secondary | ICD-10-CM | POA: Insufficient documentation

## 2016-06-08 DIAGNOSIS — Y929 Unspecified place or not applicable: Secondary | ICD-10-CM | POA: Insufficient documentation

## 2016-06-08 DIAGNOSIS — Z87891 Personal history of nicotine dependence: Secondary | ICD-10-CM | POA: Insufficient documentation

## 2016-06-08 DIAGNOSIS — N189 Chronic kidney disease, unspecified: Secondary | ICD-10-CM | POA: Diagnosis not present

## 2016-06-08 DIAGNOSIS — Z7982 Long term (current) use of aspirin: Secondary | ICD-10-CM | POA: Diagnosis not present

## 2016-06-08 DIAGNOSIS — W19XXXA Unspecified fall, initial encounter: Secondary | ICD-10-CM | POA: Diagnosis not present

## 2016-06-08 DIAGNOSIS — E039 Hypothyroidism, unspecified: Secondary | ICD-10-CM | POA: Diagnosis not present

## 2016-06-08 DIAGNOSIS — Z23 Encounter for immunization: Secondary | ICD-10-CM

## 2016-06-08 DIAGNOSIS — M79641 Pain in right hand: Secondary | ICD-10-CM

## 2016-06-08 DIAGNOSIS — S51011A Laceration without foreign body of right elbow, initial encounter: Secondary | ICD-10-CM

## 2016-06-08 DIAGNOSIS — Z85828 Personal history of other malignant neoplasm of skin: Secondary | ICD-10-CM | POA: Insufficient documentation

## 2016-06-08 DIAGNOSIS — Y939 Activity, unspecified: Secondary | ICD-10-CM | POA: Insufficient documentation

## 2016-06-08 DIAGNOSIS — S6991XA Unspecified injury of right wrist, hand and finger(s), initial encounter: Secondary | ICD-10-CM | POA: Diagnosis present

## 2016-06-08 NOTE — ED Notes (Signed)
ED Provider at bedside. 

## 2016-06-08 NOTE — Discharge Instructions (Signed)
Please read attached information. If you experience any new or worsening signs or symptoms please return to the emergency room for evaluation. Please follow-up with your primary care provider or specialist as discussed in one week for repeat hand x-ray. Please use medication prescribed only as directed and discontinue taking if you have any concerning signs or symptoms.

## 2016-06-08 NOTE — ED Triage Notes (Signed)
Pt tripped over a curb and landed on his hands and knees. Pt denies striking his head. Denies LOC.

## 2016-06-08 NOTE — ED Provider Notes (Signed)
[facility]PT [facility] Provider Note   CSN: [id number] Arrival date & time: 06/08/16  1517     History   Chief Complaint Chief Complaint  Patient presents with  . Fall    HPI Justin [name] is a 82 y.o. male.  HPI  82 year old male patient presents today status post fall. Patient reports she tripped over a curb landing on his bilateral hands and knees and right elbow. Patient reports significant pain to the palmar aspect of the right hand, pain to the right elbow with a skin tear noted. Patient also notes some superficial abrasions to the bilateral knees. Patient reports he had bloods without difficulty, denies any head trauma, neck pain, back pain, or any other significant injuries from the fall. He reports full active range of motion of the elbow sensation intact to the distal hand. Patient mostly concerned about the right hand and skin tear to the elbow. Patient's most recent tetanus within the last 5 years per patient.  Past Medical History:  Diagnosis Date  . Anemia   . Basal cell cancer    Dr Wilhemina Bonito  . Benign prostatic hypertrophy    Dr Amalia Hailey  . Carpal tunnel syndrome 10/19/2015   Right  . Chronic kidney disease   . Colon polyp   . Diverticulosis   . Hyperlipidemia   . Kidney stones   . Stroke ([place])   . Transient ischemic attack    Dr Leonie Man    Patient Active Problem List   Diagnosis Date Noted  . Left flank pain 04/23/2016  . Subclinical hypothyroidism 10/25/2015  . Carpal tunnel syndrome 10/19/2015  . Pain, joint, hand, right 08/22/2015  . B12 deficiency 12/31/2011  . Benign prostatic hyperplasia with urinary obstruction 09/25/2011  . SKIN CANCER, HX OF 03/31/2010  . DIVERTICULOSIS, COLON 05/10/2008  . Hx of TIA (transient ischemic attack) and stroke 05/10/2008  . History of colonic polyps 05/10/2008  . NEPHROLITHIASIS, HX OF 05/10/2008  . HYPERLIPIDEMIA 09/24/2007  . Anemia 12/25/2006    Past Surgical History:  Procedure Laterality Date  .  CATARACT EXTRACTION, BILATERAL    . COLONOSCOPY W/ POLYPECTOMY  01/2009   adenomatous, Dr. Alben Spittle  . HEMORRHOID SURGERY    . LITHOTRIPSY      X 2;DrAmalia Hailey  . PROSTATE BIOPSY     X2       Home Medications    Prior to Admission medications   Medication Sig Start Date End Date Taking? Authorizing Provider  aspirin 81 MG tablet Take 81 mg by mouth daily.    Historical Provider, MD  finasteride (PROSCAR) 5 MG tablet Take 5 mg by mouth daily.      Historical Provider, MD  HYDROcodone-acetaminophen (NORCO/VICODIN) 5-325 MG tablet Take 1 tablet by mouth every 6 (six) hours as needed for moderate pain. 04/23/16   Alferd Apa Lowne Chase, DO  simvastatin (ZOCOR) 20 MG tablet TAKE 1 TABLET (20 MG TOTAL) BY MOUTH AT BEDTIME. 05/18/16   [name], DO    Family History Family History  Problem Relation Age of Onset  . Heart failure Father   . Heart attack Father 83  . Diabetes Mother   . Colon cancer Mother   . Prostate cancer Brother   . Stroke Brother   . Coronary artery disease Brother     7 vessel CBAG  . Diabetes Brother   . Heart attack      2 P uncles , both > 55  . Stroke Brother  in late 39s  . Colon polyps Sister   . Heart attack Brother     Social History Social History  Substance Use Topics  . Smoking status: Former Smoker    Types: Cigarettes, Pipe, Cigars    Quit date: 07/23/1976  . Smokeless tobacco: Former Systems developer    Types: Chew     Comment: smoked [phone], up to < 1 ppd  . Alcohol use No     Comment: rare wine     Allergies   Prednisone and Inderal [propranolol]   Review of Systems Review of Systems  All other systems reviewed and are negative.    Physical Exam Updated Vital Signs BP 144/75   Pulse 65   Temp 97.8 F (36.6 C) (Oral)   Resp 18   Ht 5\' 6"  (1.676 m)   Wt 82.6 kg   SpO2 100%   BMI 29.38 kg/m   Physical Exam  Constitutional: He is oriented to person, place, and time. He appears well-developed and well-nourished.    HENT:  Head: Normocephalic and atraumatic.  Eyes: Conjunctivae are normal. Pupils are equal, round, and reactive to light. Right eye exhibits no discharge. Left eye exhibits no discharge. No scleral icterus.  Neck: Normal range of motion. No JVD present. No tracheal deviation present.  Pulmonary/Chest: Effort normal. No stridor.  Musculoskeletal:  Skin turgor noted to the right elbow, minor surrounding tenderness to palpation; full active range of motion and strength. Right palmar hand significant tenderness to palpation decreased grip strength due to pain, sensation intact in the fingers. Wrist nontender full active range of motion.  Bilateral knees with superficial abrasions full active range of motion nontender to bony structures  Neurological: He is alert and oriented to person, place, and time. Coordination normal.  Psychiatric: He has a normal mood and affect. His behavior is normal. Judgment and thought content normal.  Nursing note and vitals reviewed.    ED Treatments / Results  Labs (all labs ordered are listed, but only abnormal results are displayed) Labs Reviewed - No data to display  EKG  EKG Interpretation None       Radiology Dg Elbow Complete Right (3+view)  Result Date: 06/08/2016 CLINICAL DATA:  Right elbow laceration and pain after tripping and falling on current today. EXAM: RIGHT ELBOW - COMPLETE 3+ VIEW COMPARISON:  None in PACs FINDINGS: The bones are subjectively adequately mineralized. There is no acute fracture nor dislocation. A subtle lucency adjacent to the lateral epicondyles of the distal humerus is consistent with old injury. There is no joint effusion. IMPRESSION: There is no acute bony abnormality of the right elbow. Electronically Signed   By: David  Martinique M.D.   On: 06/08/2016 16:23   Dg Hand Complete Right  Result Date: 06/08/2016 CLINICAL DATA:  Fall today with bruising noted in the palm of the hand. EXAM: RIGHT HAND - COMPLETE 3+ VIEW  COMPARISON:  None in PACs FINDINGS: The bones are subjectively adequately mineralized for age. No acute phalangeal or metacarpal fracture is observed. The interphalangeal, metacarpophalangeal, and carpometacarpal joints are reasonably well-maintained. The visualized portions of the distal radius and ulna and of the carpal bones exhibit no acute abnormalities. The soft tissues are unremarkable. IMPRESSION: There is no acute fracture nor dislocation of the bones of the right hand. Electronically Signed   By: David  Martinique M.D.   On: 06/08/2016 16:24    Procedures Procedures (including critical care time)  Medications Ordered in ED Medications - No data to display  Initial Impression / Assessment and Plan / ED Course  I have reviewed the triage vital signs and the nursing notes.  Pertinent labs & imaging results that were available during my care of the patient were reviewed by me and considered in my medical decision making (see chart for details).  Clinical Course      Final Clinical Impressions(s) / ED Diagnoses   Final diagnoses:  Pain of right hand  Fall, initial encounter  Skin tear of right elbow without complication, initial encounter    Labs:   Imaging: DG elbow complete, DG hand complete  Consults:  Therapeutics:  Discharge Meds:   Assessment/Plan:  81 year old male status post fall. Patient's fall significant for right hand pain. He has pain throughout the palmar aspect with minor pain over the snuffbox. He has a thumb spica at home, encouraged to wear, encouraged follow-up in 1 week for repeat x-ray. Patient also has skin tear this was cleaned and bandaged here. His tetanus is up-to-date, is encouraged follow-up with primary care for reevaluation return if any signs of infection present. He verbalized understanding and agreement to today's plan had no further questions or concerns      New Prescriptions Discharge Medication List as of 06/08/2016  5:05 PM        Okey Regal, PA-C 06/08/16 [place], MD 06/08/16 2031

## 2016-06-11 ENCOUNTER — Ambulatory Visit (INDEPENDENT_AMBULATORY_CARE_PROVIDER_SITE_OTHER): Payer: Medicare Other | Admitting: Family Medicine

## 2016-06-11 ENCOUNTER — Encounter: Payer: Self-pay | Admitting: Family Medicine

## 2016-06-11 VITALS — BP 124/68 | HR 68 | Temp 97.5°F | Wt 183.5 lb

## 2016-06-11 DIAGNOSIS — S50311A Abrasion of right elbow, initial encounter: Secondary | ICD-10-CM | POA: Diagnosis not present

## 2016-06-11 NOTE — Progress Notes (Signed)
Pre visit review using our clinic review tool, if applicable. No additional management support is needed unless otherwise documented below in the visit note. 

## 2016-06-11 NOTE — Patient Instructions (Addendum)
Change dressing twice daily. Put some mupirocin/Bactroban or triple antibiotic ointment over it before dressing. OK to air out just before placing a new dressing.  Keep it clean and dry. Wear dressing in public.   Seek medical care if you start having worsening pain, fevers, drainage, or redness that is spreading.

## 2016-06-11 NOTE — Progress Notes (Signed)
Chief Complaint  Patient presents with  . Elbow Injury    Fall on concrete   The patient fell a concrete after tripping 3 days ago. X-rays are negative. He has some loose skin that is wondering if it needs to come off. No fevers.  BP 124/68 (BP Location: Left Arm, Patient Position: Sitting, Cuff Size: Normal)   Pulse 68   Temp 97.5 F (36.4 C) (Oral)   Wt 183 lb 8 oz (83.2 kg)   SpO2 93%   BMI 29.62 kg/m  Gen: Awake, alert Skin: 4 x 5 cm abrasion on lateral R elbow, no surrounding erythema or drainage, some scabbing, no warmth Psych: Age appropriate judgment and insight  Abrasion of right elbow, initial encounter  No need for debridement at this time. Wound was redressed. See AVS for wound care instructions. Pt and his wife voiced understanding and agreement to the plan.  J[place], DO 06/11/16 5:29 PM

## 2016-06-15 ENCOUNTER — Encounter (HOSPITAL_BASED_OUTPATIENT_CLINIC_OR_DEPARTMENT_OTHER): Payer: Self-pay

## 2016-06-15 ENCOUNTER — Emergency Department (HOSPITAL_BASED_OUTPATIENT_CLINIC_OR_DEPARTMENT_OTHER): Payer: Medicare Other

## 2016-06-15 ENCOUNTER — Emergency Department (HOSPITAL_BASED_OUTPATIENT_CLINIC_OR_DEPARTMENT_OTHER)
Admission: EM | Admit: 2016-06-15 | Discharge: 2016-06-15 | Disposition: A | Discharge status: Home / Self Care | Payer: Medicare Other | Attending: Emergency Medicine | Admitting: Emergency Medicine

## 2016-06-15 DIAGNOSIS — Z87891 Personal history of nicotine dependence: Secondary | ICD-10-CM | POA: Insufficient documentation

## 2016-06-15 DIAGNOSIS — X58XXXA Exposure to other specified factors, initial encounter: Secondary | ICD-10-CM | POA: Insufficient documentation

## 2016-06-15 DIAGNOSIS — Z7982 Long term (current) use of aspirin: Secondary | ICD-10-CM | POA: Diagnosis not present

## 2016-06-15 DIAGNOSIS — Y999 Unspecified external cause status: Secondary | ICD-10-CM | POA: Diagnosis not present

## 2016-06-15 DIAGNOSIS — Y939 Activity, unspecified: Secondary | ICD-10-CM | POA: Diagnosis not present

## 2016-06-15 DIAGNOSIS — Y929 Unspecified place or not applicable: Secondary | ICD-10-CM | POA: Insufficient documentation

## 2016-06-15 DIAGNOSIS — S6991XA Unspecified injury of right wrist, hand and finger(s), initial encounter: Secondary | ICD-10-CM | POA: Insufficient documentation

## 2016-06-15 DIAGNOSIS — N189 Chronic kidney disease, unspecified: Secondary | ICD-10-CM | POA: Insufficient documentation

## 2016-06-15 DIAGNOSIS — Z85828 Personal history of other malignant neoplasm of skin: Secondary | ICD-10-CM | POA: Diagnosis not present

## 2016-06-15 NOTE — ED Triage Notes (Addendum)
For recheck of right hand injury-pt states he was advised last week to return for repeat xray-NAD-steady gait

## 2016-06-15 NOTE — Discharge Instructions (Signed)
Wear your thumb spika at home. Avoid any strenuous activity. Follow up with your family doctor if not improving in 1 week.

## 2016-06-15 NOTE — ED Provider Notes (Signed)
[facility] Provider Note   CSN: [id number] Arrival date & time: 06/15/16  1128     History   Chief Complaint Chief Complaint  Patient presents with  . Hand Injury    HPI Justin [name] is a 82 y.o. male.  HPI Justin [name] is a 82 y.o. male who Presents to emergency department for recheck of his right hand injury. Proximally a week ago, patient had mechanical injury where he felt onto outstretched hand. His x-ray at that time was negative. He was found to have some snuffbox tenderness and was told to come back in one week for repeat x-ray. Patient states he continues to have pain in that area but it has improved. He was told to wear a thumb spica splint which he states he has not been wearing. He reports pain when he buttons his shirt, turns a turning switch, and when he was blowing his leaves. He has not tried any medications for this or any other treatments for this at home. Denies any numbness or weakness.  Past Medical History:  Diagnosis Date  . Anemia   . Basal cell cancer    Dr Wilhemina Bonito  . Benign prostatic hypertrophy    Dr Amalia Hailey  . Carpal tunnel syndrome 10/19/2015   Right  . Chronic kidney disease   . Colon polyp   . Diverticulosis   . Hyperlipidemia   . Kidney stones   . Stroke ([place])   . Transient ischemic attack    Dr Leonie Man    Patient Active Problem List   Diagnosis Date Noted  . Left flank pain 04/23/2016  . Subclinical hypothyroidism 10/25/2015  . Carpal tunnel syndrome 10/19/2015  . Pain, joint, hand, right 08/22/2015  . B12 deficiency 12/31/2011  . Benign prostatic hyperplasia with urinary obstruction 09/25/2011  . SKIN CANCER, HX OF 03/31/2010  . DIVERTICULOSIS, COLON 05/10/2008  . Hx of TIA (transient ischemic attack) and stroke 05/10/2008  . History of colonic polyps 05/10/2008  . NEPHROLITHIASIS, HX OF 05/10/2008  . HYPERLIPIDEMIA 09/24/2007  . Anemia 12/25/2006    Past Surgical History:  Procedure Laterality Date  .  CATARACT EXTRACTION, BILATERAL    . COLONOSCOPY W/ POLYPECTOMY  01/2009   adenomatous, Dr. Alben Spittle  . HEMORRHOID SURGERY    . LITHOTRIPSY      X 2;DrAmalia Hailey  . PROSTATE BIOPSY     X2       Home Medications    Prior to Admission medications   Medication Sig Start Date End Date Taking? Authorizing Provider  aspirin 81 MG tablet Take 81 mg by mouth daily.    Historical Provider, MD  finasteride (PROSCAR) 5 MG tablet Take 5 mg by mouth daily.      Historical Provider, MD  simvastatin (ZOCOR) 20 MG tablet TAKE 1 TABLET (20 MG TOTAL) BY MOUTH AT BEDTIME. 05/18/16   [name], DO    Family History Family History  Problem Relation Age of Onset  . Heart failure Father   . Heart attack Father 71  . Diabetes Mother   . Colon cancer Mother   . Prostate cancer Brother   . Stroke Brother   . Coronary artery disease Brother     7 vessel CBAG  . Diabetes Brother   . Heart attack      2 P uncles , both > 55  . Stroke Brother     in late 37s  . Colon polyps Sister   . Heart attack Brother  Social History Social History  Substance Use Topics  . Smoking status: Former Smoker    Types: Cigarettes, Pipe, Cigars    Quit date: 07/23/1976  . Smokeless tobacco: Former Systems developer    Types: Chew     Comment: smoked [phone], up to < 1 ppd  . Alcohol use No     Comment: rare wine     Allergies   Prednisone and Inderal [propranolol]   Review of Systems Review of Systems  Constitutional: Negative for chills and fever.  Musculoskeletal: Positive for arthralgias. Negative for joint swelling.  Neurological: Negative for weakness and numbness.  All other systems reviewed and are negative.    Physical Exam Updated Vital Signs BP 145/73 (BP Location: Left Arm)   Pulse 68   Temp 97.8 F (36.6 C) (Oral)   Resp 18   Ht 5\' 10"  (1.778 m)   Wt 83 kg   SpO2 99%   BMI 26.26 kg/m   Physical Exam  Constitutional: He is oriented to person, place, and time. He appears  well-developed and well-nourished. No distress.  Eyes: Conjunctivae are normal.  Neck: Neck supple.  Cardiovascular: Normal rate.   Pulmonary/Chest: No respiratory distress.  Abdominal: He exhibits no distension.  Musculoskeletal:  No bony tenderness to palpation over right hand or over scaphoid. ttp over adductor pollicis muscle and thenar eminence with no swelling, bruising, deformity. Strength intact with thumb abduction and adduction. Cap Refill and sensation normal to the distal thumb  Neurological: He is alert and oriented to person, place, and time.  Skin: Skin is warm and dry.  Nursing note and vitals reviewed.    ED Treatments / Results  Labs (all labs ordered are listed, but only abnormal results are displayed) Labs Reviewed - No data to display  EKG  EKG Interpretation None       Radiology Dg Hand Complete Right  Result Date: 06/15/2016 CLINICAL DATA:  Right hand injury. EXAM: RIGHT HAND - COMPLETE 3+ VIEW COMPARISON:  06/08/2016. FINDINGS: No acute bony or joint abnormality identified. No evidence of fracture or dislocation. Diffuse mild degenerative change. IMPRESSION: No acute abnormality. Electronically Signed   By: Marcello Moores  Register   On: 06/15/2016 12:10    Procedures Procedures (including critical care time)  Medications Ordered in ED Medications - No data to display   Initial Impression / Assessment and Plan / ED Course  I have reviewed the triage vital signs and the nursing notes.  Pertinent labs & imaging results that were available during my care of the patient were reviewed by me and considered in my medical decision making (see chart for details).  Clinical Course     Pt here for recheck of right hand injury. Repeat xray negative. Pt has been using his hand and blowing leaves and states he has no problems, just mild pain. Still advised to use thumb spika for at least a week for immobilization. followup with family doctor as needed.   Vitals:    06/15/16 1156 06/15/16 1157 06/15/16 1303  BP: 145/73  138/67  Pulse: 68  64  Resp: 18  12  Temp: 97.8 F (36.6 C)    TempSrc: Oral    SpO2: 99%  98%  Weight:  83 kg   Height:  5\' 10"  (1.778 m)      Final Clinical Impressions(s) / ED Diagnoses   Final diagnoses:  None    New Prescriptions New Prescriptions   No medications on file     Jeannett Senior, PA-C  06/15/16 [place], MD 06/16/16 [phone]

## 2016-06-18 ENCOUNTER — Ambulatory Visit (INDEPENDENT_AMBULATORY_CARE_PROVIDER_SITE_OTHER): Payer: Medicare Other

## 2016-06-18 DIAGNOSIS — E538 Deficiency of other specified B group vitamins: Secondary | ICD-10-CM

## 2016-06-18 MED ORDER — CYANOCOBALAMIN 1000 MCG/ML IJ SOLN
1000.0000 ug | Freq: Once | INTRAMUSCULAR | Status: AC
Start: 2016-06-18 — End: 2016-06-18
  Administered 2016-06-18: 1000 ug via INTRAMUSCULAR

## 2016-07-04 ENCOUNTER — Ambulatory Visit: Payer: Medicare Other | Admitting: Family Medicine

## 2016-07-04 ENCOUNTER — Encounter: Payer: Self-pay | Admitting: Family Medicine

## 2016-07-04 ENCOUNTER — Ambulatory Visit (INDEPENDENT_AMBULATORY_CARE_PROVIDER_SITE_OTHER): Payer: Medicare Other | Admitting: Family Medicine

## 2016-07-04 VITALS — BP 122/82 | HR 76 | Temp 98.0°F | Resp 16 | Ht 70.0 in | Wt 182.4 lb

## 2016-07-04 DIAGNOSIS — J069 Acute upper respiratory infection, unspecified: Secondary | ICD-10-CM | POA: Diagnosis not present

## 2016-07-04 MED ORDER — BENZONATATE 200 MG PO CAPS: 200.0000 mg | ORAL_CAPSULE | Freq: Two times a day (BID) | ORAL | 0 refills | Status: DC | PRN

## 2016-07-04 MED ORDER — CETIRIZINE HCL 10 MG PO TABS: 10.0000 mg | ORAL_TABLET | Freq: Every day | ORAL | 11 refills | Status: DC

## 2016-07-04 NOTE — Progress Notes (Signed)
   Subjective:    Patient ID: Justin [name], male    DOB: 1925/04/08, 82 y.o.   MRN: [id number]  HPI URI- sxs started 2-2.5 weeks ago w/ sore throat.  Then developed nasal congestion and drainage.  'i'm taking a cold but i'm trying to wear it out'.  Took some robitussin w/ some relief.  + hoarseness.  No fevers.  Denies sinus pain/pressure.  + sick contacts.  No ear pain.  Mild cough.  No tooth pain.  No N/V.   Review of Systems For ROS see HPI     Objective:   Physical Exam  Constitutional: He is oriented to person, place, and time. He appears well-developed and well-nourished. No distress.  HENT:  Head: Normocephalic and atraumatic.  No TTP over sinuses + turbinate edema + PND TMs normal bilaterally  Eyes: Conjunctivae and EOM are normal. Pupils are equal, round, and reactive to light.  Neck: Normal range of motion. Neck supple.  Cardiovascular: Normal rate, regular rhythm and normal heart sounds.   Pulmonary/Chest: Effort normal and breath sounds normal. No respiratory distress. He has no wheezes.  Lymphadenopathy:    He has no cervical adenopathy.  Neurological: He is alert and oriented to person, place, and time.  Skin: Skin is warm and dry.  Vitals reviewed.         Assessment & Plan:  URI- pt w/o evidence of bacterial infxn.  No need for abx.  Start daily antihistamine.  Cough meds prn.  Reviewed supportive care and red flags that should prompt return.  Pt expressed understanding and is in agreement w/ plan.

## 2016-07-04 NOTE — Patient Instructions (Signed)
Follow up as needed Start the once daily Zyrtec for the allergy component Drink plenty of fluids Use the Tessalon as needed for cough REST! Call with any questions or concerns Hang in there! Happy Holidays!!!

## 2016-07-04 NOTE — Progress Notes (Signed)
Pre visit review using our clinic review tool, if applicable. No additional management support is needed unless otherwise documented below in the visit note. 

## 2016-08-13 ENCOUNTER — Ambulatory Visit (INDEPENDENT_AMBULATORY_CARE_PROVIDER_SITE_OTHER): Payer: PPO | Admitting: Geriatric Medicine

## 2016-08-13 DIAGNOSIS — E538 Deficiency of other specified B group vitamins: Secondary | ICD-10-CM

## 2016-08-13 MED ORDER — CYANOCOBALAMIN 1000 MCG/ML IJ SOLN
1000.0000 ug | Freq: Once | INTRAMUSCULAR | Status: AC
Start: 2016-08-13 — End: 2016-08-13
  Administered 2016-08-13: 1000 ug via INTRAMUSCULAR

## 2016-08-23 ENCOUNTER — Other Ambulatory Visit: Payer: Self-pay | Admitting: Family Medicine

## 2016-08-23 MED ORDER — SIMVASTATIN 20 MG PO TABS: ORAL_TABLET | ORAL | 1 refills | Status: DC

## 2016-08-23 NOTE — Telephone Encounter (Signed)
Spouse. Emilia Beck in to request a refill for simvastatin   Pharmacy: [facility]

## 2016-08-23 NOTE — Telephone Encounter (Signed)
[facility] [place].  Wife notified

## 2016-08-31 ENCOUNTER — Other Ambulatory Visit: Payer: Self-pay | Admitting: Family Medicine

## 2016-08-31 NOTE — Telephone Encounter (Signed)
Finasteride refill sent to pharmacy. Pt last seen by PCP 04/2016 for acute visit. When should pt be seen for routine office visit?

## 2016-08-31 NOTE — Telephone Encounter (Signed)
Due for medicare wellness in April with RN and the can have physical with me--- he may need the difference explained to him

## 2016-09-03 NOTE — Telephone Encounter (Signed)
Justin [name]-- can you contact pt regarding below appts and explain / schedule?

## 2016-09-04 NOTE — Telephone Encounter (Signed)
AWV w/ Health Coach and CPE w/ PCP scheduled 10/26/16.

## 2016-09-25 ENCOUNTER — Ambulatory Visit (INDEPENDENT_AMBULATORY_CARE_PROVIDER_SITE_OTHER): Payer: PPO | Admitting: General Practice

## 2016-09-25 DIAGNOSIS — E538 Deficiency of other specified B group vitamins: Secondary | ICD-10-CM | POA: Diagnosis not present

## 2016-09-25 MED ORDER — CYANOCOBALAMIN 1000 MCG/ML IJ SOLN
1000.0000 ug | Freq: Once | INTRAMUSCULAR | Status: AC
  Administered 2016-09-25: 1000 ug via INTRAMUSCULAR

## 2016-10-11 DIAGNOSIS — H52201 Unspecified astigmatism, right eye: Secondary | ICD-10-CM | POA: Diagnosis not present

## 2016-10-11 DIAGNOSIS — Z961 Presence of intraocular lens: Secondary | ICD-10-CM | POA: Diagnosis not present

## 2016-10-11 DIAGNOSIS — H353131 Nonexudative age-related macular degeneration, bilateral, early dry stage: Secondary | ICD-10-CM | POA: Diagnosis not present

## 2016-10-11 DIAGNOSIS — H524 Presbyopia: Secondary | ICD-10-CM | POA: Diagnosis not present

## 2016-10-24 NOTE — Progress Notes (Deleted)
Pre visit review using our clinic review tool, if applicable. No additional management support is needed unless otherwise documented below in the visit note. 

## 2016-10-24 NOTE — Progress Notes (Deleted)
Subjective:   Justin [name] is a 83 y.o. male who presents for Medicare Annual/Subsequent preventive examination.  Review of Systems:  No ROS.  Medicare Wellness Visit.   Sleep patterns:  Home Safety/Smoke Alarms:   Living environment; residence and Firearm Safety: Seat Belt Safety/Bike Helmet: Wears seat belt.   Counseling:   Eye Exam-  Dental-  Male:   CCS- Last 02/17/09: diverticulosis PSA- No results found for: PSA      Objective:    Vitals: There were no vitals taken for this visit.  There is no height or weight on file to calculate BMI.  Tobacco History  Smoking Status  . Former Smoker  . Types: Cigarettes, Pipe, Cigars  . Quit date: 07/23/1976  Smokeless Tobacco  . Former Systems developer  . Types: Chew    Comment: smoked [phone], up to < 1 ppd     Counseling given: Not Answered   Past Medical History:  Diagnosis Date  . Anemia   . Basal cell cancer    Dr Wilhemina Bonito  . Benign prostatic hypertrophy    Dr Amalia Hailey  . Carpal tunnel syndrome 10/19/2015   Right  . Chronic kidney disease   . Colon polyp   . Diverticulosis   . Hyperlipidemia   . Kidney stones   . Stroke ([place])   . Transient ischemic attack    Dr Leonie Man   Past Surgical History:  Procedure Laterality Date  . CATARACT EXTRACTION, BILATERAL    . COLONOSCOPY W/ POLYPECTOMY  01/2009   adenomatous, Dr. Alben Spittle  . HEMORRHOID SURGERY    . LITHOTRIPSY      X 2;DrAmalia Hailey  . PROSTATE BIOPSY     X2   Family History  Problem Relation Age of Onset  . Heart failure Father   . Heart attack Father 10  . Diabetes Mother   . Colon cancer Mother   . Prostate cancer Brother   . Stroke Brother   . Coronary artery disease Brother     7 vessel CBAG  . Diabetes Brother   . Heart attack      2 P uncles , both > 55  . Stroke Brother     in late 95s  . Colon polyps Sister   . Heart attack Brother    History  Sexual Activity  . Sexual activity: Not on file    Outpatient Encounter Prescriptions as of  10/26/2016  Medication Sig  . aspirin 81 MG tablet Take 81 mg by mouth daily.  . benzonatate (TESSALON) 200 MG capsule Take 1 capsule (200 mg total) by mouth 2 (two) times daily as needed for cough.  . cetirizine (ZYRTEC) 10 MG tablet Take 1 tablet (10 mg total) by mouth daily.  . finasteride (PROSCAR) 5 MG tablet TAKE 1 TABLET (5 MG TOTAL) BY MOUTH DAILY.  . simvastatin (ZOCOR) 20 MG tablet TAKE 1 TABLET (20 MG TOTAL) BY MOUTH AT BEDTIME.   Facility-Administered Encounter Medications as of 10/26/2016  Medication  . cyanocobalamin ((VITAMIN B-12)) injection 1,000 mcg    Activities of Daily Living In your present state of health, do you have any difficulty performing the following activities: 07/04/2016 12/09/2015  Hearing? N -  Vision? N Y  Difficulty concentrating or making decisions? N N  Walking or climbing stairs? N N  Dressing or bathing? N N  Doing errands, shopping? N N  Some recent data might be hidden    Patient Care Team: [name], DO as PCP -  General (Family Medicine) Domingo Pulse, MD (Urology)   Assessment:    Physical assessment deferred to PCP.  Exercise Activities and Dietary recommendations   Diet (meal preparation, eat out, water intake, caffeinated beverages, dairy products, fruits and vegetables): {Desc; diets:16563} Breakfast: Lunch:  Dinner:      Goals    None     Fall Risk Fall Risk  12/09/2015 08/22/2015 06/12/2013 06/10/2012  Falls in the past year? Yes No No No  Number falls in past yr: 1 - - -  Injury with Fall? Yes - - -  Risk Factor Category  High Fall Risk - - -  Risk for fall due to : Impaired balance/gait - - -  Follow up Falls prevention discussed - - -   Depression Screen PHQ 2/9 Scores 12/09/2015 02/28/2015 02/25/2015 06/12/2013  PHQ - 2 Score 0 0 0 0  Exception Documentation Patient refusal - - -    Cognitive Function        Immunization History  Administered Date(s) Administered  . Influenza Split 05/25/2011,  05/23/2012  . Influenza Whole 05/27/2007, 05/26/2008, 06/13/2009, 05/23/2010  . Influenza, High Dose Seasonal PF 05/25/2015, 06/08/2016  . Influenza, Seasonal, Injecte, Preservative Fre 05/25/2013  . Influenza,inj,Quad PF,36+ Mos 05/10/2014  . Td 08/13/2002  . Tdap 12/03/2012   Screening Tests Health Maintenance  Topic Date Due  . PNA vac Low Risk Adult (1 of 2 - PCV13) 04/22/2017 (Originally 06/13/1992)  . INFLUENZA VACCINE  02/20/2017  . TETANUS/TDAP  12/04/2022      Plan:    During the course of the visit the patient was educated and counseled about the following appropriate screening and preventive services:   Vaccines to include Pneumoccal, Influenza, Td, HCV  Cardiovascular Disease  Colorectal cancer screening  Diabetes screening  Prostate Cancer Screening  Glaucoma screening  Nutrition counseling   Smoking cessation counseling  Patient Instructions (the written plan) was given to the patient.    Shela Nevin, [place]  10/24/2016

## 2016-10-26 ENCOUNTER — Ambulatory Visit (INDEPENDENT_AMBULATORY_CARE_PROVIDER_SITE_OTHER): Payer: PPO | Admitting: Family Medicine

## 2016-10-26 ENCOUNTER — Encounter: Payer: PPO | Admitting: Family Medicine

## 2016-10-26 ENCOUNTER — Encounter: Payer: Self-pay | Admitting: Family Medicine

## 2016-10-26 VITALS — BP 102/64 | Temp 97.8°F | Resp 16 | Ht 70.0 in | Wt 182.8 lb

## 2016-10-26 DIAGNOSIS — Z0000 Encounter for general adult medical examination without abnormal findings: Secondary | ICD-10-CM

## 2016-10-26 DIAGNOSIS — N138 Other obstructive and reflux uropathy: Secondary | ICD-10-CM

## 2016-10-26 DIAGNOSIS — R319 Hematuria, unspecified: Secondary | ICD-10-CM | POA: Diagnosis not present

## 2016-10-26 DIAGNOSIS — E782 Mixed hyperlipidemia: Secondary | ICD-10-CM

## 2016-10-26 DIAGNOSIS — N401 Enlarged prostate with lower urinary tract symptoms: Secondary | ICD-10-CM | POA: Diagnosis not present

## 2016-10-26 DIAGNOSIS — E038 Other specified hypothyroidism: Secondary | ICD-10-CM

## 2016-10-26 DIAGNOSIS — E039 Hypothyroidism, unspecified: Secondary | ICD-10-CM

## 2016-10-26 DIAGNOSIS — Z8673 Personal history of transient ischemic attack (TIA), and cerebral infarction without residual deficits: Secondary | ICD-10-CM | POA: Diagnosis not present

## 2016-10-26 LAB — COMPREHENSIVE METABOLIC PANEL
ALT: 16 U/L (ref 9–46)
AST: 21 U/L (ref 10–35)
Albumin: 3.9 g/dL (ref 3.6–5.1)
Alkaline Phosphatase: 69 U/L (ref 40–115)
BUN: 20 mg/dL (ref 7–25)
CO2: 29 mmol/L (ref 20–31)
Calcium: 8.6 mg/dL (ref 8.6–10.3)
Chloride: 104 mmol/L (ref 98–110)
Creat: 0.93 mg/dL (ref 0.70–1.11)
Glucose, Bld: 93 mg/dL (ref 65–99)
Potassium: 3.6 mmol/L (ref 3.5–5.3)
Sodium: 139 mmol/L (ref 135–146)
Total Bilirubin: 0.5 mg/dL (ref 0.2–1.2)
Total Protein: 7.8 g/dL (ref 6.1–8.1)

## 2016-10-26 LAB — POC URINALSYSI DIPSTICK (AUTOMATED)
Bilirubin, UA: NEGATIVE
Glucose, UA: NEGATIVE
Ketones, UA: NEGATIVE
Leukocytes, UA: NEGATIVE
Nitrite, UA: NEGATIVE
Protein, UA: NEGATIVE
Spec Grav, UA: 1.025 (ref 1.030–1.035)
Urobilinogen, UA: NEGATIVE (ref ?–2.0)
pH, UA: 6 (ref 5.0–8.0)

## 2016-10-26 LAB — CBC
HCT: 35.5 % — ABNORMAL LOW (ref 38.5–50.0)
Hemoglobin: 12 g/dL — ABNORMAL LOW (ref 13.2–17.1)
MCH: 32.3 pg (ref 27.0–33.0)
MCHC: 33.8 g/dL (ref 32.0–36.0)
MCV: 95.7 fL (ref 80.0–100.0)
MPV: 9.4 fL (ref 7.5–12.5)
Platelets: 179 10*3/uL (ref 140–400)
RBC: 3.71 MIL/uL — ABNORMAL LOW (ref 4.20–5.80)
RDW: 14 % (ref 11.0–15.0)
WBC: 5.3 10*3/uL (ref 3.8–10.8)

## 2016-10-26 LAB — LIPID PANEL
Cholesterol: 129 mg/dL (ref <200)
HDL: 51 mg/dL (ref >40)
LDL Cholesterol: 59 mg/dL (ref <100)
Total CHOL/HDL Ratio: 2.5 Ratio (ref <5.0)
Triglycerides: 94 mg/dL (ref <150)
VLDL: 19 mg/dL (ref <30)

## 2016-10-26 LAB — TSH: TSH: 4.7 mIU/L — ABNORMAL HIGH (ref 0.40–4.50)

## 2016-10-26 NOTE — Assessment & Plan Note (Signed)
Encouraged heart healthy diet, increase exercise, avoid trans fats, consider a krill oil cap daily 

## 2016-10-26 NOTE — Patient Instructions (Signed)
Preventive Care 65 Years and Older, Male Preventive care refers to lifestyle choices and visits with your health care provider that can promote health and wellness. What does preventive care include?  A yearly physical exam. This is also called an annual well check.  Dental exams once or twice a year.  Routine eye exams. Ask your health care provider how often you should have your eyes checked.  Personal lifestyle choices, including:  Daily care of your teeth and gums.  Regular physical activity.  Eating a healthy diet.  Avoiding tobacco and drug use.  Limiting alcohol use.  Practicing safe sex.  Taking low doses of aspirin every day.  Taking vitamin and mineral supplements as recommended by your health care provider. What happens during an annual well check? The services and screenings done by your health care provider during your annual well check will depend on your age, overall health, lifestyle risk factors, and family history of disease. Counseling  Your health care provider may ask you questions about your:  Alcohol use.  Tobacco use.  Drug use.  Emotional well-being.  Home and relationship well-being.  Sexual activity.  Eating habits.  History of falls.  Memory and ability to understand (cognition).  Work and work environment. Screening  You may have the following tests or measurements:  Height, weight, and BMI.  Blood pressure.  Lipid and cholesterol levels. These may be checked every 5 years, or more frequently if you are over 50 years old.  Skin check.  Lung cancer screening. You may have this screening every year starting at age 55 if you have a 30-pack-year history of smoking and currently smoke or have quit within the past 15 years.  Fecal occult blood test (FOBT) of the stool. You may have this test every year starting at age 50.  Flexible sigmoidoscopy or colonoscopy. You may have a sigmoidoscopy every 5 years or a colonoscopy every 10  years starting at age 50.  Prostate cancer screening. Recommendations will vary depending on your family history and other risks.  Hepatitis C blood test.  Hepatitis B blood test.  Sexually transmitted disease (STD) testing.  Diabetes screening. This is done by checking your blood sugar (glucose) after you have not eaten for a while (fasting). You may have this done every 1-3 years.  Abdominal aortic aneurysm (AAA) screening. You may need this if you are a current or former smoker.  Osteoporosis. You may be screened starting at age 70 if you are at high risk. Talk with your health care provider about your test results, treatment options, and if necessary, the need for more tests. Vaccines  Your health care provider may recommend certain vaccines, such as:  Influenza vaccine. This is recommended every year.  Tetanus, diphtheria, and acellular pertussis (Tdap, Td) vaccine. You may need a Td booster every 10 years.  Varicella vaccine. You may need this if you have not been vaccinated.  Zoster vaccine. You may need this after age 60.  Measles, mumps, and rubella (MMR) vaccine. You may need at least one dose of MMR if you were born in 1957 or later. You may also need a second dose.  Pneumococcal 13-valent conjugate (PCV13) vaccine. One dose is recommended after age 65.  Pneumococcal polysaccharide (PPSV23) vaccine. One dose is recommended after age 65.  Meningococcal vaccine. You may need this if you have certain conditions.  Hepatitis A vaccine. You may need this if you have certain conditions or if you travel or work in places where   you may be exposed to hepatitis A.  Hepatitis B vaccine. You may need this if you have certain conditions or if you travel or work in places where you may be exposed to hepatitis B.  Haemophilus influenzae type b (Hib) vaccine. You may need this if you have certain risk factors. Talk to your health care provider about which screenings and vaccines you  need and how often you need them. This information is not intended to replace advice given to you by your health care provider. Make sure you discuss any questions you have with your health care provider. Document Released: 08/05/2015 Document Revised: 03/28/2016 Document Reviewed: 05/10/2015 Elsevier Interactive Patient Education  2017 [employer].

## 2016-10-26 NOTE — Progress Notes (Signed)
Subjective:  I acted as a Education administrator for Dr. Royden Purl, LPN    Patient ID: Justin [name], male    DOB: 1925/01/26, 83 y.o.   MRN: [id number]  Chief Complaint  Patient presents with  . Annual Exam  . Hyperlipidemia    follow up    Hyperlipidemia  This is a chronic problem. The current episode started more than 1 year ago. Pertinent negatives include no chest pain.    Patient is in today for annual physical, and follow up hyperlipidemia. Patient report he had constipation, and upset stomach last week. Patient also report he doesn't drink a lot of water and he have experience dehydration last week.   Patient Care Team: [name], DO as PCP - General (Family Medicine) Domingo Pulse, MD (Urology) Rutherford Guys, MD as Consulting Physician (Ophthalmology) Danella Sensing, MD as Consulting Physician (Dermatology)   Past Medical History:  Diagnosis Date  . Anemia   . Basal cell cancer    Dr Wilhemina Bonito  . Benign prostatic hypertrophy    Dr Amalia Hailey  . Carpal tunnel syndrome 10/19/2015   Right  . Chronic kidney disease   . Colon polyp   . Diverticulosis   . Hyperlipidemia   . Kidney stones   . Stroke ([place])   . Transient ischemic attack    Dr Leonie Man    Past Surgical History:  Procedure Laterality Date  . CATARACT EXTRACTION, BILATERAL    . COLONOSCOPY W/ POLYPECTOMY  01/2009   adenomatous, Dr. Alben Spittle  . HEMORRHOID SURGERY    . LITHOTRIPSY      X 2;DrAmalia Hailey  . PROSTATE BIOPSY     X2    Family History  Problem Relation Age of Onset  . Heart failure Father   . Heart attack Father 89  . Diabetes Mother   . Colon cancer Mother   . Prostate cancer Brother   . Stroke Brother   . Coronary artery disease Brother     7 vessel CBAG  . Diabetes Brother   . Heart attack      2 P uncles , both > 55  . Stroke Brother     in late 66s  . Colon polyps Sister   . Heart attack Brother     Social History   Social History  . Marital status: Married    Spouse  name: N/A  . Number of children: 4  . Years of education: N/A   Occupational History  . Truck driver    Social History Main Topics  . Smoking status: Former Smoker    Types: Cigarettes, Pipe, Cigars    Quit date: 07/23/1976  . Smokeless tobacco: Former Systems developer    Types: Chew     Comment: smoked [phone], up to < 1 ppd  . Alcohol use No     Comment: rare wine  . Drug use: No  . Sexual activity: Not on file   Other Topics Concern  . Not on file   Social History Narrative  . No narrative on file    Outpatient Medications Prior to Visit  Medication Sig Dispense Refill  . aspirin 81 MG tablet Take 81 mg by mouth daily.    . benzonatate (TESSALON) 200 MG capsule Take 1 capsule (200 mg total) by mouth 2 (two) times daily as needed for cough. 60 capsule 0  . cetirizine (ZYRTEC) 10 MG tablet Take 1 tablet (10 mg total) by mouth daily. 30 tablet 11  .  finasteride (PROSCAR) 5 MG tablet TAKE 1 TABLET (5 MG TOTAL) BY MOUTH DAILY. 90 tablet 0  . simvastatin (ZOCOR) 20 MG tablet TAKE 1 TABLET (20 MG TOTAL) BY MOUTH AT BEDTIME. 90 tablet 1   Facility-Administered Medications Prior to Visit  Medication Dose Route Frequency Provider Last Rate Last Dose  . cyanocobalamin ((VITAMIN B-12)) injection 1,000 mcg  1,000 mcg Intramuscular Once Hendricks Limes, MD        Allergies  Allergen Reactions  . Prednisone     REACTION: RASH  . Inderal [Propranolol]     When taken for tremor it caused "streaks of pain " across forehead    Review of Systems  Constitutional: Negative for fever.  HENT: Negative for congestion.   Eyes: Negative for blurred vision.  Respiratory: Negative for cough.   Cardiovascular: Negative for chest pain and palpitations.  Gastrointestinal: Negative for vomiting.  Musculoskeletal: Negative for back pain.  Skin: Negative for rash.  Neurological: Negative for loss of consciousness and headaches.       Objective:    Physical Exam  Constitutional: He is oriented to  person, place, and time. He appears well-developed and well-nourished. No distress.  HENT:  Head: Normocephalic and atraumatic.  Eyes: Conjunctivae are normal.  Neck: Normal range of motion. No thyromegaly present.  Cardiovascular: Normal rate and regular rhythm.   Pulmonary/Chest: Effort normal and breath sounds normal. He has no wheezes.  Abdominal: Soft. Bowel sounds are normal. There is no tenderness.  Musculoskeletal: Normal range of motion. He exhibits no edema or deformity.  Neurological: He is alert and oriented to person, place, and time.  Skin: Skin is warm and dry. He is not diaphoretic.  Psychiatric: He has a normal mood and affect.    BP 102/64 (BP Location: Left Arm, Patient Position: Sitting, Cuff Size: Normal)   Temp 97.8 F (36.6 C) (Oral)   Resp 16   Ht 5\' 10"  (1.778 m)   Wt 182 lb 12.8 oz (82.9 kg)   SpO2 98%   BMI 26.23 kg/m  Wt Readings from Last 3 Encounters:  10/26/16 182 lb 12.8 oz (82.9 kg)  07/04/16 182 lb 6 oz (82.7 kg)  06/15/16 183 lb (83 kg)   BP Readings from Last 3 Encounters:  10/26/16 102/64  07/04/16 122/82  06/15/16 138/67     Immunization History  Administered Date(s) Administered  . Influenza Split 05/25/2011, 05/23/2012  . Influenza Whole 05/27/2007, 05/26/2008, 06/13/2009, 05/23/2010  . Influenza, High Dose Seasonal PF 05/25/2015, 06/08/2016  . Influenza, Seasonal, Injecte, Preservative Fre 05/25/2013  . Influenza,inj,Quad PF,36+ Mos 05/10/2014  . Td 08/13/2002  . Tdap 12/03/2012    Health Maintenance  Topic Date Due  . PNA vac Low Risk Adult (1 of 2 - PCV13) 04/22/2017 (Originally 06/13/1992)  . INFLUENZA VACCINE  02/20/2017  . TETANUS/TDAP  12/04/2022    Lab Results  Component Value Date   WBC 5.3 10/26/2016   HGB 12.0 (L) 10/26/2016   HCT 35.5 (L) 10/26/2016   PLT 179 10/26/2016   GLUCOSE 93 10/26/2016   CHOL 129 10/26/2016   TRIG 94 10/26/2016   HDL 51 10/26/2016   LDLCALC 59 10/26/2016   ALT 16 10/26/2016    AST 21 10/26/2016   NA 139 10/26/2016   K 3.6 10/26/2016   CL 104 10/26/2016   CREATININE 0.93 10/26/2016   BUN 20 10/26/2016   CO2 29 10/26/2016   TSH 4.70 (H) 10/26/2016   HGBA1C 5.4 01/10/2016   MICROALBUR 1.3 01/10/2016  Lab Results  Component Value Date   TSH 4.70 (H) 10/26/2016   Lab Results  Component Value Date   WBC 5.3 10/26/2016   HGB 12.0 (L) 10/26/2016   HCT 35.5 (L) 10/26/2016   MCV 95.7 10/26/2016   PLT 179 10/26/2016   Lab Results  Component Value Date   NA 139 10/26/2016   K 3.6 10/26/2016   CO2 29 10/26/2016   GLUCOSE 93 10/26/2016   BUN 20 10/26/2016   CREATININE 0.93 10/26/2016   BILITOT 0.5 10/26/2016   ALKPHOS 69 10/26/2016   AST 21 10/26/2016   ALT 16 10/26/2016   PROT 7.8 10/26/2016   ALBUMIN 3.9 10/26/2016   CALCIUM 8.6 10/26/2016   GFR 99.64 04/23/2016   Lab Results  Component Value Date   CHOL 129 10/26/2016   Lab Results  Component Value Date   HDL 51 10/26/2016   Lab Results  Component Value Date   LDLCALC 59 10/26/2016   Lab Results  Component Value Date   TRIG 94 10/26/2016   Lab Results  Component Value Date   CHOLHDL 2.5 10/26/2016   Lab Results  Component Value Date   HGBA1C 5.4 01/10/2016         Assessment & Plan:   Problem List Items Addressed This Visit      Unprioritized   Subclinical hypothyroidism   Relevant Orders   TSH (Completed)   HYPERLIPIDEMIA - Primary    .Encouraged heart healthy diet, increase exercise, avoid trans fats, consider a krill oil cap daily       Relevant Orders   Lipid panel (Completed)   Benign prostatic hyperplasia with urinary obstruction    Per urology      Hx of TIA (transient ischemic attack) and stroke    ekg  NSR      Preventative health care    ghm utd Check labs See AVS      Relevant Orders   CBC (Completed)   Comprehensive metabolic panel (Completed)   TSH (Completed)   POCT Urinalysis Dipstick (Automated) (Completed)   Urine culture      Other Visit Diagnoses    Hx of arterial ischemic stroke       Hx of completed stroke       Relevant Orders   EKG 12-Lead (Completed)      I am having Mr. Gerding maintain his aspirin, benzonatate, cetirizine, simvastatin, and finasteride. We will continue to administer cyanocobalamin.  No orders of the defined types were placed in this encounter.   CMA served as Education administrator during this visit. History, Physical and Plan performed by medical provider. Documentation and orders reviewed and attested to.  [name], DO   Patient ID: Justin [name], male   DOB: [birth year], 83 y.o.   MRN: [id number]

## 2016-10-26 NOTE — Progress Notes (Signed)
Pre visit review using our clinic review tool, if applicable. No additional management support is needed unless otherwise documented below in the visit note. 

## 2016-10-27 DIAGNOSIS — Z0000 Encounter for general adult medical examination without abnormal findings: Secondary | ICD-10-CM | POA: Insufficient documentation

## 2016-10-27 NOTE — Assessment & Plan Note (Signed)
Per u rology 

## 2016-10-27 NOTE — Assessment & Plan Note (Signed)
ghm utd Check labs See AVS 

## 2016-10-27 NOTE — Assessment & Plan Note (Signed)
ekg = NSR   

## 2016-10-28 LAB — URINE CULTURE: Organism ID, Bacteria: NO GROWTH

## 2016-10-29 ENCOUNTER — Other Ambulatory Visit: Payer: Self-pay | Admitting: Family Medicine

## 2016-10-29 DIAGNOSIS — E039 Hypothyroidism, unspecified: Secondary | ICD-10-CM

## 2016-10-29 DIAGNOSIS — K625 Hemorrhage of anus and rectum: Secondary | ICD-10-CM

## 2016-10-30 ENCOUNTER — Ambulatory Visit (INDEPENDENT_AMBULATORY_CARE_PROVIDER_SITE_OTHER): Payer: PPO | Admitting: General Practice

## 2016-10-30 ENCOUNTER — Other Ambulatory Visit (INDEPENDENT_AMBULATORY_CARE_PROVIDER_SITE_OTHER): Payer: PPO

## 2016-10-30 DIAGNOSIS — E039 Hypothyroidism, unspecified: Secondary | ICD-10-CM

## 2016-10-30 DIAGNOSIS — E538 Deficiency of other specified B group vitamins: Secondary | ICD-10-CM

## 2016-10-30 MED ORDER — CYANOCOBALAMIN 1000 MCG/ML IJ SOLN
1000.0000 ug | Freq: Once | INTRAMUSCULAR | Status: AC
  Administered 2016-10-30: 1000 ug via INTRAMUSCULAR

## 2016-10-31 LAB — T4, FREE: Free T4: 0.75 ng/dL (ref 0.60–1.60)

## 2016-10-31 LAB — T3, FREE: T3, Free: 2.7 pg/mL (ref 2.3–4.2)

## 2016-10-31 LAB — TSH: TSH: 5.17 u[IU]/mL — ABNORMAL HIGH (ref 0.35–4.50)

## 2016-11-28 DIAGNOSIS — R319 Hematuria, unspecified: Secondary | ICD-10-CM | POA: Diagnosis not present

## 2016-11-28 DIAGNOSIS — N400 Enlarged prostate without lower urinary tract symptoms: Secondary | ICD-10-CM | POA: Diagnosis not present

## 2016-11-28 DIAGNOSIS — R828 Abnormal findings on cytological and histological examination of urine: Secondary | ICD-10-CM | POA: Diagnosis not present

## 2016-11-28 DIAGNOSIS — Z87442 Personal history of urinary calculi: Secondary | ICD-10-CM | POA: Diagnosis not present

## 2016-12-01 ENCOUNTER — Other Ambulatory Visit: Payer: Self-pay | Admitting: Family Medicine

## 2016-12-06 ENCOUNTER — Encounter: Payer: PPO | Admitting: Family Medicine

## 2016-12-28 DIAGNOSIS — B029 Zoster without complications: Secondary | ICD-10-CM | POA: Diagnosis not present

## 2017-01-03 ENCOUNTER — Encounter: Payer: Self-pay | Admitting: Family Medicine

## 2017-01-03 ENCOUNTER — Ambulatory Visit (INDEPENDENT_AMBULATORY_CARE_PROVIDER_SITE_OTHER): Payer: PPO | Admitting: Family Medicine

## 2017-01-03 VITALS — BP 120/60 | HR 69 | Temp 98.0°F | Resp 16 | Ht 70.0 in | Wt 179.4 lb

## 2017-01-03 DIAGNOSIS — B029 Zoster without complications: Secondary | ICD-10-CM | POA: Diagnosis not present

## 2017-01-03 MED ORDER — VALACYCLOVIR HCL 1 G PO TABS: ORAL_TABLET | ORAL | 0 refills | Status: DC

## 2017-01-03 NOTE — Progress Notes (Signed)
Patient ID: Justin [name], male   DOB: 09-02-1924, 83 y.o.   MRN: [id number]     Subjective:  I acted as a Education administrator for Dr. Carollee Herter.  Guerry Bruin, [place]   Patient ID: Justin [name], male    DOB: July 18, 1925, 83 y.o.   MRN: [id number]  Chief Complaint  Patient presents with  . Herpes Zoster    right side of chest that goes around to back.    HPI  Patient is in today for possible shingles.  Pt was seen in UC and started on valtrex.    Patient Care Team: Carollee Herter, Alferd Apa, DO as PCP - General (Family Medicine) Domingo Pulse, MD (Urology) Rutherford Guys, MD as Consulting Physician (Ophthalmology) Danella Sensing, MD as Consulting Physician (Dermatology)   Past Medical History:  Diagnosis Date  . Anemia   . Basal cell cancer    Dr Wilhemina Bonito  . Benign prostatic hypertrophy    Dr Amalia Hailey  . Carpal tunnel syndrome 10/19/2015   Right  . Chronic kidney disease   . Colon polyp   . Diverticulosis   . Hyperlipidemia   . Kidney stones   . Stroke ([place])   . Transient ischemic attack    Dr Leonie Man    Past Surgical History:  Procedure Laterality Date  . CATARACT EXTRACTION, BILATERAL    . COLONOSCOPY W/ POLYPECTOMY  01/2009   adenomatous, Dr. Alben Spittle  . HEMORRHOID SURGERY    . LITHOTRIPSY      X 2;DrAmalia Hailey  . PROSTATE BIOPSY     X2    Family History  Problem Relation Age of Onset  . Heart failure Father   . Heart attack Father 73  . Diabetes Mother   . Colon cancer Mother   . Prostate cancer Brother   . Stroke Brother   . Coronary artery disease Brother        7 vessel CBAG  . Diabetes Brother   . Heart attack Unknown        2 P uncles , both > 55  . Stroke Brother        in late 78s  . Colon polyps Sister   . Heart attack Brother     Social History   Social History  . Marital status: Married    Spouse name: N/A  . Number of children: 4  . Years of education: N/A   Occupational History  . Truck driver    Social History Main Topics  . Smoking status:  Former Smoker    Types: Cigarettes, Pipe, Cigars    Quit date: 07/23/1976  . Smokeless tobacco: Former Systems developer    Types: Chew     Comment: smoked [phone], up to < 1 ppd  . Alcohol use No     Comment: rare wine  . Drug use: No  . Sexual activity: Not on file   Other Topics Concern  . Not on file   Social History Narrative  . No narrative on file    Outpatient Medications Prior to Visit  Medication Sig Dispense Refill  . aspirin 81 MG tablet Take 81 mg by mouth daily.    . finasteride (PROSCAR) 5 MG tablet TAKE 1 TABLET (5 MG TOTAL) BY MOUTH DAILY. 90 tablet 0  . simvastatin (ZOCOR) 20 MG tablet TAKE 1 TABLET (20 MG TOTAL) BY MOUTH AT BEDTIME. 90 tablet 1  . benzonatate (TESSALON) 200 MG capsule Take 1 capsule (200 mg total) by mouth 2 (two) times daily as  needed for cough. 60 capsule 0  . cetirizine (ZYRTEC) 10 MG tablet Take 1 tablet (10 mg total) by mouth daily. 30 tablet 11   Facility-Administered Medications Prior to Visit  Medication Dose Route Frequency Provider Last Rate Last Dose  . cyanocobalamin ((VITAMIN B-12)) injection 1,000 mcg  1,000 mcg Intramuscular Once Hendricks Limes, MD        Allergies  Allergen Reactions  . Prednisone     REACTION: RASH  . Inderal [Propranolol]     When taken for tremor it caused "streaks of pain " across forehead    Review of Systems  Constitutional: Negative for fever and malaise/fatigue.  HENT: Negative for congestion.   Eyes: Negative for blurred vision.  Respiratory: Negative for shortness of breath.   Cardiovascular: Negative for chest pain, palpitations and leg swelling.  Gastrointestinal: Negative for abdominal pain, blood in stool and nausea.  Genitourinary: Negative for dysuria and frequency.  Musculoskeletal: Negative for falls.  Skin: Negative for rash.  Neurological: Negative for dizziness, loss of consciousness and headaches.  Endo/Heme/Allergies: Negative for environmental allergies.  Psychiatric/Behavioral:  Negative for depression. The patient is not nervous/anxious.        Objective:    Physical Exam  Constitutional: He is oriented to person, place, and time. Vital signs are normal. He appears well-developed and well-nourished. He is sleeping.  HENT:  Head: Normocephalic and atraumatic.  Mouth/Throat: Oropharynx is clear and moist.  Eyes: EOM are normal. Pupils are equal, round, and reactive to light.  Neck: Normal range of motion. Neck supple. No thyromegaly present.  Cardiovascular: Normal rate and regular rhythm.   No murmur heard. Pulmonary/Chest: Effort normal and breath sounds normal. No respiratory distress. He has no wheezes. He has no rales. He exhibits no tenderness.  Musculoskeletal: He exhibits no edema or tenderness.  Neurological: He is alert and oriented to person, place, and time.  Skin: Skin is warm and dry. Rash noted. Rash is vesicular.     Psychiatric: He has a normal mood and affect. His behavior is normal. Judgment and thought content normal.  Nursing note and vitals reviewed.   BP 120/60 (BP Location: Left Arm, Cuff Size: Normal)   Pulse 69   Temp 98 F (36.7 C) (Oral)   Resp 16   Ht 5\' 10"  (1.778 m)   Wt 179 lb 6.4 oz (81.4 kg)   SpO2 98%   BMI 25.74 kg/m  Wt Readings from Last 3 Encounters:  01/03/17 179 lb 6.4 oz (81.4 kg)  10/26/16 182 lb 12.8 oz (82.9 kg)  07/04/16 182 lb 6 oz (82.7 kg)   BP Readings from Last 3 Encounters:  01/03/17 120/60  10/26/16 102/64  07/04/16 122/82     Immunization History  Administered Date(s) Administered  . Influenza Split 05/25/2011, 05/23/2012  . Influenza Whole 05/27/2007, 05/26/2008, 06/13/2009, 05/23/2010  . Influenza, High Dose Seasonal PF 05/25/2015, 06/08/2016  . Influenza, Seasonal, Injecte, Preservative Fre 05/25/2013  . Influenza,inj,Quad PF,36+ Mos 05/10/2014  . Td 08/13/2002  . Tdap 12/03/2012    Health Maintenance  Topic Date Due  . PNA vac Low Risk Adult (1 of 2 - PCV13) 04/22/2017  (Originally 06/13/1992)  . INFLUENZA VACCINE  02/20/2017  . TETANUS/TDAP  12/04/2022    Lab Results  Component Value Date   WBC 5.3 10/26/2016   HGB 12.0 (L) 10/26/2016   HCT 35.5 (L) 10/26/2016   PLT 179 10/26/2016   GLUCOSE 93 10/26/2016   CHOL 129 10/26/2016   TRIG 94 10/26/2016  HDL 51 10/26/2016   LDLCALC 59 10/26/2016   ALT 16 10/26/2016   AST 21 10/26/2016   NA 139 10/26/2016   K 3.6 10/26/2016   CL 104 10/26/2016   CREATININE 0.93 10/26/2016   BUN 20 10/26/2016   CO2 29 10/26/2016   TSH 5.17 (H) 10/30/2016   HGBA1C 5.4 01/10/2016   MICROALBUR 1.3 01/10/2016    Lab Results  Component Value Date   TSH 5.17 (H) 10/30/2016   Lab Results  Component Value Date   WBC 5.3 10/26/2016   HGB 12.0 (L) 10/26/2016   HCT 35.5 (L) 10/26/2016   MCV 95.7 10/26/2016   PLT 179 10/26/2016   Lab Results  Component Value Date   NA 139 10/26/2016   K 3.6 10/26/2016   CO2 29 10/26/2016   GLUCOSE 93 10/26/2016   BUN 20 10/26/2016   CREATININE 0.93 10/26/2016   BILITOT 0.5 10/26/2016   ALKPHOS 69 10/26/2016   AST 21 10/26/2016   ALT 16 10/26/2016   PROT 7.8 10/26/2016   ALBUMIN 3.9 10/26/2016   CALCIUM 8.6 10/26/2016   GFR 99.64 04/23/2016   Lab Results  Component Value Date   CHOL 129 10/26/2016   Lab Results  Component Value Date   HDL 51 10/26/2016   Lab Results  Component Value Date   LDLCALC 59 10/26/2016   Lab Results  Component Value Date   TRIG 94 10/26/2016   Lab Results  Component Value Date   CHOLHDL 2.5 10/26/2016   Lab Results  Component Value Date   HGBA1C 5.4 01/10/2016         Assessment & Plan:   Problem List Items Addressed This Visit    None    Visit Diagnoses    Herpes zoster without complication    -  Primary   Relevant Medications   valACYclovir (VALTREX) 1000 MG tablet    will extend valtrex 3 more days for total 10 days   I have discontinued Mr. Barney benzonatate and cetirizine. I am also having him maintain  his aspirin, simvastatin, finasteride, traMADol, and valACYclovir. We will continue to administer cyanocobalamin.  Meds ordered this encounter  Medications  . traMADol (ULTRAM) 50 MG tablet    Sig: TAKE ONE TABLET (50 MG TOTAL) BY MOUTH EVERY 6 (SIX) HOURS AS NEEDED FOR PAIN.    Refill:  0  . DISCONTD: valACYclovir (VALTREX) 1000 MG tablet    Sig: TAKE ONE TABLET (1,000 MG TOTAL) BY MOUTH 3 (THREE) TIMES A DAY.    Refill:  0  . valACYclovir (VALTREX) 1000 MG tablet    Sig: TAKE ONE TABLET (1,000 MG TOTAL) BY MOUTH 3 (THREE) TIMES A DAY.    Dispense:  9 tablet    Refill:  0    CMA served as scribe during this visit. History, Physical and Plan performed by medical provider. Documentation and orders reviewed and attested to.  [name], DO

## 2017-01-03 NOTE — Patient Instructions (Signed)
Shingles Shingles, which is also known as herpes zoster, is an infection that causes a painful skin rash and fluid-filled blisters. Shingles is not related to genital herpes, which is a sexually transmitted infection. Shingles only develops in people who:  Have had chickenpox.  Have received the chickenpox vaccine. (This is rare.)  What are the causes? Shingles is caused by varicella-zoster virus (VZV). This is the same virus that causes chickenpox. After exposure to VZV, the virus stays in the body in an inactive (dormant) state. Shingles develops if the virus reactivates. This can happen many years after the initial exposure to VZV. It is not known what causes this virus to reactivate. What increases the risk? People who have had chickenpox or received the chickenpox vaccine are at risk for shingles. Infection is more common in people who:  Are older than age 50.  Have a weakened defense (immune) system, such as those with HIV, AIDS, or cancer.  Are taking medicines that weaken the immune system, such as transplant medicines.  Are under great stress.  What are the signs or symptoms? Early symptoms of this condition include itching, tingling, and pain in an area on your skin. Pain may be described as burning, stabbing, or throbbing. A few days or weeks after symptoms start, a painful red rash appears, usually on one side of the body in a bandlike or beltlike pattern. The rash eventually turns into fluid-filled blisters that break open, scab over, and dry up in about 2-3 weeks. At any time during the infection, you may also develop:  A fever.  Chills.  A headache.  An upset stomach.  How is this diagnosed? This condition is diagnosed with a skin exam. Sometimes, skin or fluid samples are taken from the blisters before a diagnosis is made. These samples are examined under a microscope or sent to a lab for testing. How is this treated? There is no specific cure for this condition.  Your health care provider will probably prescribe medicines to help you manage pain, recover more quickly, and avoid long-term problems. Medicines may include:  Antiviral drugs.  Anti-inflammatory drugs.  Pain medicines.  If the area involved is on your face, you may be referred to a specialist, such as an eye doctor (ophthalmologist) or an ear, nose, and throat (ENT) doctor to help you avoid eye problems, chronic pain, or disability. Follow these instructions at home: Medicines  Take medicines only as directed by your health care provider.  Apply an anti-itch or numbing cream to the affected area as directed by your health care provider. Blister and Rash Care  Take a cool bath or apply cool compresses to the area of the rash or blisters as directed by your health care provider. This may help with pain and itching.  Keep your rash covered with a loose bandage (dressing). Wear loose-fitting clothing to help ease the pain of material rubbing against the rash.  Keep your rash and blisters clean with mild soap and cool water or as directed by your health care provider.  Check your rash every day for signs of infection. These include redness, swelling, and pain that lasts or increases.  Do not pick your blisters.  Do not scratch your rash. General instructions  Rest as directed by your health care provider.  Keep all follow-up visits as directed by your health care provider. This is important.  Until your blisters scab over, your infection can cause chickenpox in people who have never had it or been vaccinated   against it. To prevent this from happening, avoid contact with other people, especially: ? Babies. ? Pregnant women. ? Children who have eczema. ? Elderly people who have transplants. ? People who have chronic illnesses, such as leukemia or AIDS. Contact a health care provider if:  Your pain is not relieved with prescribed medicines.  Your pain does not get better after  the rash heals.  Your rash looks infected. Signs of infection include redness, swelling, and pain that lasts or increases. Get help right away if:  The rash is on your face or nose.  You have facial pain, pain around your eye area, or loss of feeling on one side of your face.  You have ear pain or you have ringing in your ear.  You have loss of taste.  Your condition gets worse. This information is not intended to replace advice given to you by your health care provider. Make sure you discuss any questions you have with your health care provider. Document Released: 07/09/2005 Document Revised: 03/04/2016 Document Reviewed: 05/20/2014 Elsevier Interactive Patient Education  2017 [employer] Inc.  

## 2017-01-09 ENCOUNTER — Telehealth: Payer: Self-pay | Admitting: Family Medicine

## 2017-01-09 NOTE — Telephone Encounter (Signed)
aller name: Relation to XN:TZGY Call back number:[phone] Pharmacy:[facility]  Reason for call: Pt states he is completely out of the rx , states he not sure if Dr. Etter Sjogren wants him to continue and if she does he is out and need it today  Medication  valACYclovir (VALTREX) 1000 MG tablet [13132]

## 2017-01-10 NOTE — Telephone Encounter (Signed)
Patient informed of PCP instructions. 

## 2017-01-10 NOTE — Telephone Encounter (Signed)
He should be done with it

## 2017-01-21 ENCOUNTER — Ambulatory Visit (INDEPENDENT_AMBULATORY_CARE_PROVIDER_SITE_OTHER): Payer: PPO | Admitting: General Practice

## 2017-01-21 DIAGNOSIS — E538 Deficiency of other specified B group vitamins: Secondary | ICD-10-CM

## 2017-01-21 MED ORDER — CYANOCOBALAMIN 1000 MCG/ML IJ SOLN
1000.0000 ug | Freq: Once | INTRAMUSCULAR | Status: AC
  Administered 2017-01-21: 1000 ug via INTRAMUSCULAR

## 2017-02-05 ENCOUNTER — Encounter: Payer: Self-pay | Admitting: Family Medicine

## 2017-02-05 ENCOUNTER — Ambulatory Visit (INDEPENDENT_AMBULATORY_CARE_PROVIDER_SITE_OTHER): Payer: PPO | Admitting: Family Medicine

## 2017-02-05 VITALS — BP 120/70 | HR 56 | Temp 97.8°F | Ht 70.0 in | Wt 178.2 lb

## 2017-02-05 DIAGNOSIS — R1013 Epigastric pain: Secondary | ICD-10-CM | POA: Diagnosis not present

## 2017-02-05 MED ORDER — OMEPRAZOLE 40 MG PO CPDR: 40.0000 mg | DELAYED_RELEASE_CAPSULE | Freq: Every day | ORAL | 3 refills | Status: DC

## 2017-02-05 NOTE — Progress Notes (Signed)
Pre visit review using our clinic review tool, if applicable. No additional management support is needed unless otherwise documented below in the visit note. 

## 2017-02-05 NOTE — Patient Instructions (Signed)
Food Choices for Gastroesophageal Reflux Disease, Adult When you have gastroesophageal reflux disease (GERD), the foods you eat and your eating habits are very important. Choosing the right foods can help ease your discomfort. What guidelines do I need to follow?  Choose fruits, vegetables, whole grains, and low-fat dairy products.  Choose low-fat meat, fish, and poultry.  Limit fats such as oils, salad dressings, butter, nuts, and avocado.  Keep a food diary. This helps you identify foods that cause symptoms.  Avoid foods that cause symptoms. These may be different for everyone.  Eat small meals often instead of 3 large meals a day.  Eat your meals slowly, in a place where you are relaxed.  Limit fried foods.  Cook foods using methods other than frying.  Avoid drinking alcohol.  Avoid drinking large amounts of liquids with your meals.  Avoid bending over or lying down until 2-3 hours after eating. What foods are not recommended? These are some foods and drinks that may make your symptoms worse: Vegetables  Tomatoes. Tomato juice. Tomato and spaghetti sauce. Chili peppers. Onion and garlic. Horseradish. Fruits  Oranges, grapefruit, and lemon (fruit and juice). Meats  High-fat meats, fish, and poultry. This includes hot dogs, ribs, ham, sausage, salami, and bacon. Dairy  Whole milk and chocolate milk. Sour cream. Cream. Butter. Ice cream. Cream cheese. Drinks  Coffee and tea. Bubbly (carbonated) drinks or energy drinks. Condiments  Hot sauce. Barbecue sauce. Sweets/Desserts  Chocolate and cocoa. Donuts. Peppermint and spearmint. Fats and Oils  High-fat foods. This includes French fries and potato chips. Other  Vinegar. Strong spices. This includes black pepper, white pepper, red pepper, cayenne, curry powder, cloves, ginger, and chili powder. The items listed above may not be a complete list of foods and drinks to avoid. Contact your dietitian for more information.    This information is not intended to replace advice given to you by your health care provider. Make sure you discuss any questions you have with your health care provider. Document Released: 01/08/2012 Document Revised: 12/15/2015 Document Reviewed: 05/13/2013 Elsevier Interactive Patient Education  2017 Elsevier Inc.  

## 2017-02-05 NOTE — Progress Notes (Signed)
Patient ID: Justin [name], male    DOB: [birth year]  Age: 83 y.o. MRN: [id number]    Subjective:  Subjective  HPI Justin [name] presents for gerd-- mid epigastric pain.    Review of Systems  Constitutional: Negative for appetite change, diaphoresis, fatigue and unexpected weight change.  Eyes: Negative for pain, redness and visual disturbance.  Respiratory: Negative for cough, chest tightness, shortness of breath and wheezing.   Cardiovascular: Negative for chest pain, palpitations and leg swelling.  Gastrointestinal: Positive for abdominal pain. Negative for abdominal distention.  Endocrine: Negative for cold intolerance, heat intolerance, polydipsia, polyphagia and polyuria.  Genitourinary: Negative for difficulty urinating, dysuria and frequency.  Neurological: Negative for dizziness, light-headedness, numbness and headaches.    History Past Medical History:  Diagnosis Date  . Anemia   . Basal cell cancer    Dr Justin [name]  . Benign prostatic hypertrophy    Dr Justin [name]  . Carpal tunnel syndrome 10/19/2015   Right  . Chronic kidney disease   . Colon polyp   . Diverticulosis   . Hyperlipidemia   . Kidney stones   . Stroke ([place])   . Transient ischemic attack    Dr Justin [name]    He has a past surgical history that includes Lithotripsy; Hemorrhoid surgery; Colonoscopy w/ polypectomy (01/2009); Cataract extraction, bilateral; and Prostate biopsy.   His family history includes Colon cancer in his mother; Colon polyps in his sister; Coronary artery disease in his brother; Diabetes in his brother and mother; Heart attack in his brother and unknown relative; Heart attack (age of onset: 83) in his father; Heart failure in his father; Prostate cancer in his brother; Stroke in his brother and brother.He reports that he quit smoking about 40 years ago. His smoking use included Cigarettes, Pipe, and Cigars. He has quit using smokeless tobacco. His smokeless tobacco use included Chew. He reports  that he does not drink alcohol or use drugs.  Current Outpatient Prescriptions on File Prior to Visit  Medication Sig Dispense Refill  . aspirin 81 MG tablet Take 81 mg by mouth daily.    . finasteride (PROSCAR) 5 MG tablet TAKE 1 TABLET (5 MG TOTAL) BY MOUTH DAILY. 90 tablet 0  . simvastatin (ZOCOR) 20 MG tablet TAKE 1 TABLET (20 MG TOTAL) BY MOUTH AT BEDTIME. 90 tablet 1  . traMADol (ULTRAM) 50 MG tablet TAKE ONE TABLET (50 MG TOTAL) BY MOUTH EVERY 6 (SIX) HOURS AS NEEDED FOR PAIN.  0   Current Facility-Administered Medications on File Prior to Visit  Medication Dose Route Frequency Provider Last Rate Last Dose  . cyanocobalamin ((VITAMIN B-12)) injection 1,000 mcg  1,000 mcg Intramuscular Once Hendricks Limes, MD         Objective:  Objective  Physical Exam  Constitutional: He is oriented to person, place, and time. Vital signs are normal. He appears well-developed and well-nourished. He is sleeping.  HENT:  Head: Normocephalic and atraumatic.  Mouth/Throat: Oropharynx is clear and moist.  Eyes: Pupils are equal, round, and reactive to light. EOM are normal.  Neck: Normal range of motion. Neck supple. No thyromegaly present.  Cardiovascular: Normal rate and regular rhythm.   No murmur heard. Pulmonary/Chest: Effort normal and breath sounds normal. No respiratory distress. He has no wheezes. He has no rales. He exhibits no tenderness.  Abdominal: Soft. Normal appearance and bowel sounds are normal. There is tenderness in the epigastric area. There is no rebound.  Musculoskeletal: He exhibits no edema or tenderness.  Neurological: He  is alert and oriented to person, place, and time.  Skin: Skin is warm and dry.  Psychiatric: He has a normal mood and affect. His behavior is normal. Judgment and thought content normal.  Nursing note and vitals reviewed.  BP 120/70 (BP Location: Left Arm, Patient Position: Sitting, Cuff Size: Normal)   Pulse (!) 56   Temp 97.8 F (36.6 C) (Oral)    Ht 5\' 10"  (1.778 m)   Wt 178 lb 4 oz (80.9 kg)   SpO2 99%   BMI 25.58 kg/m  Wt Readings from Last 3 Encounters:  02/05/17 178 lb 4 oz (80.9 kg)  01/03/17 179 lb 6.4 oz (81.4 kg)  10/26/16 182 lb 12.8 oz (82.9 kg)     Lab Results  Component Value Date   WBC 5.3 10/26/2016   HGB 12.0 (L) 10/26/2016   HCT 35.5 (L) 10/26/2016   PLT 179 10/26/2016   GLUCOSE 93 10/26/2016   CHOL 129 10/26/2016   TRIG 94 10/26/2016   HDL 51 10/26/2016   LDLCALC 59 10/26/2016   ALT 16 10/26/2016   AST 21 10/26/2016   NA 139 10/26/2016   K 3.6 10/26/2016   CL 104 10/26/2016   CREATININE 0.93 10/26/2016   BUN 20 10/26/2016   CO2 29 10/26/2016   TSH 5.17 (H) 10/30/2016   HGBA1C 5.4 01/10/2016   MICROALBUR 1.3 01/10/2016    Dg Hand Complete Right  Result Date: 06/15/2016 CLINICAL DATA:  Right hand injury. EXAM: RIGHT HAND - COMPLETE 3+ VIEW COMPARISON:  06/08/2016. FINDINGS: No acute bony or joint abnormality identified. No evidence of fracture or dislocation. Diffuse mild degenerative change. IMPRESSION: No acute abnormality. Electronically Signed   By: Marcello Moores  Register   On: 06/15/2016 12:10     Assessment & Plan:  Plan  I have discontinued Justin [name] valACYclovir. I am also having him start on omeprazole. Additionally, I am having him maintain his aspirin, simvastatin, finasteride, and traMADol. We will continue to administer cyanocobalamin.  Meds ordered this encounter  Medications  . omeprazole (PRILOSEC) 40 MG capsule    Sig: Take 1 capsule (40 mg total) by mouth daily.    Dispense:  30 capsule    Refill:  3    Problem List Items Addressed This Visit    None    Visit Diagnoses    Dyspepsia    -  Primary   Relevant Medications   omeprazole (PRILOSEC) 40 MG capsule   Other Relevant Orders   Ambulatory referral to Gastroenterology    see ho Follow-up: Return if symptoms worsen or fail to improve.  [name], DO

## 2017-02-11 MED FILL — OMEPRAZOLE DR 40 MG CAPSULE: 40 | 30 days supply | Qty: 30 | Fill #0

## 2017-02-22 ENCOUNTER — Encounter: Payer: Self-pay | Admitting: Family Medicine

## 2017-02-22 ENCOUNTER — Ambulatory Visit (INDEPENDENT_AMBULATORY_CARE_PROVIDER_SITE_OTHER): Payer: PPO | Admitting: Family Medicine

## 2017-02-22 VITALS — BP 114/70 | HR 65 | Temp 97.5°F | Ht 70.0 in | Wt 180.6 lb

## 2017-02-22 DIAGNOSIS — M545 Low back pain, unspecified: Secondary | ICD-10-CM

## 2017-02-22 MED ORDER — TRAMADOL HCL 50 MG PO TABS: 50.0000 mg | ORAL_TABLET | Freq: Two times a day (BID) | ORAL | 0 refills | Status: DC | PRN

## 2017-02-22 NOTE — Progress Notes (Signed)
Musculoskeletal Exam  Patient: Justin [name] DOB: September 26, 1924  DOS: 02/22/2017  SUBJECTIVE:  Chief Complaint:   Chief Complaint  Patient presents with  . Hip Pain    Left hip pain started 7.28.18. Denies any injury. Intermittent and does not hurt while seated.Justin [name] is a 83 y.o.  male for evaluation and treatment of L hip pain.   Onset:  6 days ago. No injury or change in activity. Only hurts while he stands/walks Location: Points to L lower back Character:  sharp  Progression of issue:  is unchanged Associated symptoms: none Treatment: to date has been warm showers which has been helpful.   Neurovascular symptoms: no  ROS: Musculoskeletal/Extremities: +low back pain Neurologic: no numbness, tingling no weakness   Past Medical History:  Diagnosis Date  . Anemia   . Basal cell cancer    Dr Justin [name]  . Benign prostatic hypertrophy    Dr Justin [name]  . Carpal tunnel syndrome 10/19/2015   Right  . Chronic kidney disease   . Colon polyp   . Diverticulosis   . Hyperlipidemia   . Kidney stones   . Stroke ([place])   . Transient ischemic attack    Dr Justin [name]   Past Surgical History:  Procedure Laterality Date  . CATARACT EXTRACTION, BILATERAL    . COLONOSCOPY W/ POLYPECTOMY  01/2009   adenomatous, Dr. Alben [name]  . HEMORRHOID SURGERY    . LITHOTRIPSY      X 2;DrAmalia [name]  . PROSTATE BIOPSY     X2   Family History  Problem Relation Age of Onset  . Heart failure Father   . Heart attack Father 39  . Diabetes Mother   . Colon cancer Mother   . Prostate cancer Brother   . Stroke Brother   . Coronary artery disease Brother        7 vessel CBAG  . Diabetes Brother   . Heart attack Unknown        2 P uncles , both > 55  . Stroke Brother        in late 72s  . Colon polyps Sister   . Heart attack Brother    Current Outpatient Prescriptions  Medication Sig Dispense Refill  . aspirin 81 MG tablet Take 81 mg by mouth daily.    . finasteride (PROSCAR) 5 MG tablet  TAKE 1 TABLET (5 MG TOTAL) BY MOUTH DAILY. 90 tablet 0  . omeprazole (PRILOSEC) 40 MG capsule Take 1 capsule (40 mg total) by mouth daily. 30 capsule 3  . simvastatin (ZOCOR) 20 MG tablet TAKE 1 TABLET (20 MG TOTAL) BY MOUTH AT BEDTIME. 90 tablet 1  . traMADol (ULTRAM) 50 MG tablet TAKE ONE TABLET (50 MG TOTAL) BY MOUTH EVERY 6 (SIX) HOURS AS NEEDED FOR PAIN.  0    Allergies  Allergen Reactions  . Prednisone     REACTION: RASH  . Inderal [Propranolol]     When taken for tremor it caused "streaks of pain " across forehead   Social History   Social History  . Marital status: Married   Occupational History  . Truck driver    Social History Main Topics  . Smoking status: Former Smoker    Types: Cigarettes, Pipe, Cigars    Quit date: 07/23/1976  . Smokeless tobacco: Former Systems developer    Types: Chew     Comment: smoked [phone], up to < 1 ppd  . Alcohol use No     Comment:  rare wine  . Drug use: No   Objective: VITAL SIGNS: BP 114/70 (BP Location: Left Arm, Patient Position: Sitting, Cuff Size: Large)   Pulse 65   Temp (!) 97.5 F (36.4 C) (Oral)   Ht 5\' 10"  (1.778 m)   Wt 180 lb 9.6 oz (81.9 kg)   SpO2 99%   BMI 25.91 kg/m  Constitutional: Well formed, well developed. No acute distress. Thorax & Lungs: No accessory muscle use Extremities: No clubbing. No cyanosis. No edema.  Skin: Warm. Dry. No erythema. No rash.  Musculoskeletal: Low back and hip.   +TTP over L lumbar paraspinal musculature at approx L 2-3. No edema or ecchymosis. No TTP over greater troch on L Deformity: no Tests positive: None Tests negative: Log roll, straight leg, Lesegues +Poor ROM of L hamstring Neurologic: Normal sensory function.  Psychiatric: Normal mood. Age appropriate judgment and insight. Alert & oriented x 3.    Assessment:  Acute left-sided low back pain without sciatica - Plan: traMADol (ULTRAM) 50 MG tablet  Plan: Orders as above. Tylenol. Tramadol for breakthrough pain. Heat.  Stretches/exercises given, don't do what is not tolerated. F/u prn. The patient voiced understanding and agreement to the plan.   [place], DO 02/22/17  2:43 PM

## 2017-02-22 NOTE — Patient Instructions (Addendum)
Heat (pad or rice pillow in microwave) over affected area, 10-15 minutes every 2-3 hours while awake.   OK to take Tylenol 1000 mg (2 extra strength tabs) or 975 mg (3 regular strength tabs) every 6 hours as needed.  Use the tramadol for breakthrough pain.  If you cannot do any of the follow stretches/exercises, don't do it/push yourself.  EXERCISES  RANGE OF MOTION (ROM) AND STRETCHING EXERCISES - Low Back Sprain Most people with lower back pain will find that their symptoms get worse with excessive bending forward (flexion) or arching at the lower back (extension). The exercises that will help resolve your symptoms will focus on the opposite motion.  Your physician, physical therapist or athletic trainer will help you determine which exercises will be most helpful to resolve your lower back pain. Do not complete any exercises without first consulting with your caregiver. Discontinue any exercises which make your symptoms worse, until you speak to your caregiver. If you have pain, numbness or tingling which travels down into your buttocks, leg or foot, the goal of the therapy is for these symptoms to move closer to your back and eventually resolve. Sometimes, these leg symptoms will get better, but your lower back pain may worsen. This is often an indication of progress in your rehabilitation. Be very alert to any changes in your symptoms and the activities in which you participated in the 24 hours prior to the change. Sharing this information with your caregiver will allow him or her to most efficiently treat your condition. These exercises may help you when beginning to rehabilitate your injury. Your symptoms may resolve with or without further involvement from your physician, physical therapist or athletic trainer. While completing these exercises, remember:   Restoring tissue flexibility helps normal motion to return to the joints. This allows healthier, less painful movement and activity.  An  effective stretch should be held for at least 30 seconds.  A stretch should never be painful. You should only feel a gentle lengthening or release in the stretched tissue. FLEXION RANGE OF MOTION AND STRETCHING EXERCISES:  STRETCH - Flexion, Single Knee to Chest   Lie on a firm bed or floor with both legs extended in front of you.  Keeping one leg in contact with the floor, bring your opposite knee to your chest. Hold your leg in place by either grabbing behind your thigh or at your knee.  Pull until you feel a gentle stretch in your low back. Hold 15-20 seconds.  Slowly release your grasp and repeat the exercise with the opposite side. Repeat 2 times. Complete this exercise 1-2 times per day.   STRETCH - Flexion, Double Knee to Chest  Lie on a firm bed or floor with both legs extended in front of you.  Keeping one leg in contact with the floor, bring your opposite knee to your chest.  Tense your stomach muscles to support your back and then lift your other knee to your chest. Hold your legs in place by either grabbing behind your thighs or at your knees.  Pull both knees toward your chest until you feel a gentle stretch in your low back. Hold 15-20 seconds.  Tense your stomach muscles and slowly return one leg at a time to the floor. Repeat 2 times. Complete this exercise 1-2 times per day.   STRETCH - Low Trunk Rotation  Lie on a firm bed or floor. Keeping your legs in front of you, bend your knees so they are both  pointed toward the ceiling and your feet are flat on the floor.  Extend your arms out to the side. This will stabilize your upper body by keeping your shoulders in contact with the floor.  Gently and slowly drop both knees together to one side until you feel a gentle stretch in your low back. Hold for 15-20 seconds.  Tense your stomach muscles to support your lower back as you bring your knees back to the starting position. Repeat the exercise to the other  side. Repeat 2 times. Complete this exercise 1-2 times per day  EXTENSION RANGE OF MOTION AND FLEXIBILITY EXERCISES:  STRETCH - Extension, Prone on Elbows   Lie on your stomach on the floor, a bed will be too soft. Place your palms about shoulder width apart and at the height of your head.  Place your elbows under your shoulders. If this is too painful, stack pillows under your chest.  Allow your body to relax so that your hips drop lower and make contact more completely with the floor.  Hold this position for 15-20 seconds.  Slowly return to lying flat on the floor. Repeat 2 times. Complete this exercise 1-2 times per day.   RANGE OF MOTION - Extension, Prone Press Ups  Lie on your stomach on the floor, a bed will be too soft. Place your palms about shoulder width apart and at the height of your head.  Keeping your back as relaxed as possible, slowly straighten your elbows while keeping your hips on the floor. You may adjust the placement of your hands to maximize your comfort. As you gain motion, your hands will come more underneath your shoulders.  Hold this position 15-20 seconds.  Slowly return to lying flat on the floor. Repeat 2 times. Complete this exercise 1-2 times per day.   RANGE OF MOTION- Quadruped, Neutral Spine   Assume a hands and knees position on a firm surface. Keep your hands under your shoulders and your knees under your hips. You may place padding under your knees for comfort.  Drop your head and point your tailbone toward the ground below you. This will round out your lower back like an angry cat. Hold this position for 15-20 seconds.  Slowly lift your head and release your tail bone so that your back sags into a large arch, like an old horse.  Hold this position for 15-20 seconds.  Repeat this until you feel limber in your low back.  Now, find your "sweet spot." This will be the most comfortable position somewhere between the two previous positions.  This is your neutral spine. Once you have found this position, tense your stomach muscles to support your low back.  Hold this position for 15-20 seconds. Repeat 2 times. Complete this exercise 1-2 times per day.  STRENGTHENING EXERCISES - Low Back Sprain These exercises may help you when beginning to rehabilitate your injury. These exercises should be done near your "sweet spot." This is the neutral, low-back arch, somewhere between fully rounded and fully arched, that is your least painful position. When performed in this safe range of motion, these exercises can be used for people who have either a flexion or extension based injury. These exercises may resolve your symptoms with or without further involvement from your physician, physical therapist or athletic trainer. While completing these exercises, remember:   Muscles can gain both the endurance and the strength needed for everyday activities through controlled exercises.  Complete these exercises as instructed by your  physician, physical therapist or athletic trainer. Increase the resistance and repetitions only as guided.  You may experience muscle soreness or fatigue, but the pain or discomfort you are trying to eliminate should never worsen during these exercises. If this pain does worsen, stop and make certain you are following the directions exactly. If the pain is still present after adjustments, discontinue the exercise until you can discuss the trouble with your caregiver.  STRENGTHENING - Deep Abdominals, Pelvic Tilt   Lie on a firm bed or floor. Keeping your legs in front of you, bend your knees so they are both pointed toward the ceiling and your feet are flat on the floor.  Tense your lower abdominal muscles to press your low back into the floor. This motion will rotate your pelvis so that your tail bone is scooping upwards rather than pointing at your feet or into the floor. With a gentle tension and even breathing, hold this  position for 10-15 seconds. Repeat 2 times. Complete this exercise 1 time per day.   STRENGTHENING - Abdominals, Crunches   Lie on a firm bed or floor. Keeping your legs in front of you, bend your knees so they are both pointed toward the ceiling and your feet are flat on the floor. Cross your arms over your chest.  Slightly tip your chin down without bending your neck.  Tense your abdominals and slowly lift your trunk high enough to just clear your shoulder blades. Lifting higher can put excessive stress on the lower back and does not further strengthen your abdominal muscles.  Control your return to the starting position. Repeat 2 times. Complete this exercise once every 1-2 days.   STRENGTHENING - Quadruped, Opposite UE/LE Lift   Assume a hands and knees position on a firm surface. Keep your hands under your shoulders and your knees under your hips. You may place padding under your knees for comfort.  Find your neutral spine and gently tense your abdominal muscles so that you can maintain this position. Your shoulders and hips should form a rectangle that is parallel with the floor and is not twisted.  Keeping your trunk steady, lift your right hand no higher than your shoulder and then your left leg no higher than your hip. Make sure you are not holding your breath. Hold this position for 15-20 seconds.  Continuing to keep your abdominal muscles tense and your back steady, slowly return to your starting position. Repeat with the opposite arm and leg. Repeat 2 times. Complete this exercise once every 1-2 days.   STRENGTHENING - Abdominals and Quadriceps, Straight Leg Raise   Lie on a firm bed or floor with both legs extended in front of you.  Keeping one leg in contact with the floor, bend the other knee so that your foot can rest flat on the floor.  Find your neutral spine, and tense your abdominal muscles to maintain your spinal position throughout the exercise.  Slowly lift  your straight leg off the floor about 6 inches for a count of 15, making sure to not hold your breath.  Still keeping your neutral spine, slowly lower your leg all the way to the floor. Repeat this exercise with each leg 2 times. Complete this exercise once every 1-2 days. POSTURE AND BODY MECHANICS CONSIDERATIONS - Low Back Sprain Keeping correct posture when sitting, standing or completing your activities will reduce the stress put on different body tissues, allowing injured tissues a chance to heal and limiting painful experiences.  The following are general guidelines for improved posture. Your physician or physical therapist will provide you with any instructions specific to your needs. While reading these guidelines, remember:  The exercises prescribed by your provider will help you have the flexibility and strength to maintain correct postures.  The correct posture provides the best environment for your joints to work. All of your joints have less wear and tear when properly supported by a spine with good posture. This means you will experience a healthier, less painful body.  Correct posture must be practiced with all of your activities, especially prolonged sitting and standing. Correct posture is as important when doing repetitive low-stress activities (typing) as it is when doing a single heavy-load activity (lifting).  RESTING POSITIONS Consider which positions are most painful for you when choosing a resting position. If you have pain with flexion-based activities (sitting, bending, stooping, squatting), choose a position that allows you to rest in a less flexed posture. You would want to avoid curling into a fetal position on your side. If your pain worsens with extension-based activities (prolonged standing, working overhead), avoid resting in an extended position such as sleeping on your stomach. Most people will find more comfort when they rest with their spine in a more neutral  position, neither too rounded nor too arched. Lying on a non-sagging bed on your side with a pillow between your knees, or on your back with a pillow under your knees will often provide some relief. Keep in mind, being in any one position for a prolonged period of time, no matter how correct your posture, can still lead to stiffness. PROPER SITTING POSTURE In order to minimize stress and discomfort on your spine, you must sit with correct posture. Sitting with good posture should be effortless for a healthy body. Returning to good posture is a gradual process. Many people can work toward this most comfortably by using various supports until they have the flexibility and strength to maintain this posture on their own. When sitting with proper posture, your ears will fall over your shoulders and your shoulders will fall over your hips. You should use the back of the chair to support your upper back. Your lower back will be in a neutral position, just slightly arched. You may place a small pillow or folded towel at the base of your lower back for  support.  When working at a desk, create an environment that supports good, upright posture. Without extra support, muscles tire, which leads to excessive strain on joints and other tissues. Keep these recommendations in mind:  CHAIR:  A chair should be able to slide under your desk when your back makes contact with the back of the chair. This allows you to work closely.  The chair's height should allow your eyes to be level with the upper part of your monitor and your hands to be slightly lower than your elbows.  BODY POSITION  Your feet should make contact with the floor. If this is not possible, use a foot rest.  Keep your ears over your shoulders. This will reduce stress on your neck and low back.  INCORRECT SITTING POSTURES  If you are feeling tired and unable to assume a healthy sitting posture, do not slouch or slump. This puts excessive strain on  your back tissues, causing more damage and pain. Healthier options include:  Using more support, like a lumbar pillow.  Switching tasks to something that requires you to be upright or walking.  Talking  a brief walk.  Lying down to rest in a neutral-spine position.  PROLONGED STANDING WHILE SLIGHTLY LEANING FORWARD  When completing a task that requires you to lean forward while standing in one place for a long time, place either foot up on a stationary 2-4 inch high object to help maintain the best posture. When both feet are on the ground, the lower back tends to lose its slight inward curve. If this curve flattens (or becomes too large), then the back and your other joints will experience too much stress, tire more quickly, and can cause pain.  CORRECT STANDING POSTURES Proper standing posture should be assumed with all daily activities, even if they only take a few moments, like when brushing your teeth. As in sitting, your ears should fall over your shoulders and your shoulders should fall over your hips. You should keep a slight tension in your abdominal muscles to brace your spine. Your tailbone should point down to the ground, not behind your body, resulting in an over-extended swayback posture.   INCORRECT STANDING POSTURES  Common incorrect standing postures include a forward head, locked knees and/or an excessive swayback. WALKING Walk with an upright posture. Your ears, shoulders and hips should all line-up.  PROLONGED ACTIVITY IN A FLEXED POSITION When completing a task that requires you to bend forward at your waist or lean over a low surface, try to find a way to stabilize 3 out of 4 of your limbs. You can place a hand or elbow on your thigh or rest a knee on the surface you are reaching across. This will provide you more stability, so that your muscles do not tire as quickly. By keeping your knees relaxed, or slightly bent, you will also reduce stress across your lower  back. CORRECT LIFTING TECHNIQUES  DO :  Assume a wide stance. This will provide you more stability and the opportunity to get as close as possible to the object which you are lifting.  Tense your abdominals to brace your spine. Bend at the knees and hips. Keeping your back locked in a neutral-spine position, lift using your leg muscles. Lift with your legs, keeping your back straight.  Test the weight of unknown objects before attempting to lift them.  Try to keep your elbows locked down at your sides in order get the best strength from your shoulders when carrying an object.     Always ask for help when lifting heavy or awkward objects. INCORRECT LIFTING TECHNIQUES DO NOT:   Lock your knees when lifting, even if it is a small object.  Bend and twist. Pivot at your feet or move your feet when needing to change directions.  Assume that you can safely pick up even a paperclip without proper posture.

## 2017-02-25 ENCOUNTER — Other Ambulatory Visit: Payer: Self-pay | Admitting: Family Medicine

## 2017-03-01 ENCOUNTER — Ambulatory Visit: Payer: PPO | Admitting: Physician Assistant

## 2017-03-12 ENCOUNTER — Ambulatory Visit (INDEPENDENT_AMBULATORY_CARE_PROVIDER_SITE_OTHER): Payer: PPO | Admitting: General Practice

## 2017-03-12 ENCOUNTER — Ambulatory Visit (INDEPENDENT_AMBULATORY_CARE_PROVIDER_SITE_OTHER): Payer: PPO | Admitting: Physician Assistant

## 2017-03-12 ENCOUNTER — Other Ambulatory Visit (INDEPENDENT_AMBULATORY_CARE_PROVIDER_SITE_OTHER): Payer: PPO

## 2017-03-12 ENCOUNTER — Encounter: Payer: Self-pay | Admitting: Physician Assistant

## 2017-03-12 VITALS — BP 100/64 | HR 64 | Ht 70.0 in | Wt 178.1 lb

## 2017-03-12 DIAGNOSIS — R634 Abnormal weight loss: Secondary | ICD-10-CM

## 2017-03-12 DIAGNOSIS — E538 Deficiency of other specified B group vitamins: Secondary | ICD-10-CM | POA: Diagnosis not present

## 2017-03-12 DIAGNOSIS — R1011 Right upper quadrant pain: Secondary | ICD-10-CM | POA: Diagnosis not present

## 2017-03-12 DIAGNOSIS — R1013 Epigastric pain: Secondary | ICD-10-CM | POA: Diagnosis not present

## 2017-03-12 LAB — COMPREHENSIVE METABOLIC PANEL
ALT: 11 U/L (ref 0–53)
AST: 15 U/L (ref 0–37)
Albumin: 3.6 g/dL (ref 3.5–5.2)
Alkaline Phosphatase: 64 U/L (ref 39–117)
BUN: 19 mg/dL (ref 6–23)
CO2: 33 mEq/L — ABNORMAL HIGH (ref 19–32)
Calcium: 8.8 mg/dL (ref 8.4–10.5)
Chloride: 104 mEq/L (ref 96–112)
Creatinine, Ser: 0.81 mg/dL (ref 0.40–1.50)
GFR: 95.21 mL/min (ref >60.00)
Glucose, Bld: 88 mg/dL (ref 70–99)
Potassium: 3.8 mEq/L (ref 3.5–5.1)
Sodium: 139 mEq/L (ref 135–145)
Total Bilirubin: 0.5 mg/dL (ref 0.2–1.2)
Total Protein: 8.1 g/dL (ref 6.0–8.3)

## 2017-03-12 LAB — CBC WITH DIFFERENTIAL/PLATELET
Basophils Absolute: 0 10*3/uL (ref 0.0–0.1)
Basophils Relative: 0.9 % (ref 0.0–3.0)
Eosinophils Absolute: 0.2 10*3/uL (ref 0.0–0.7)
Eosinophils Relative: 3 % (ref 0.0–5.0)
HCT: 35.3 % — ABNORMAL LOW (ref 39.0–52.0)
Hemoglobin: 12.3 g/dL — ABNORMAL LOW (ref 13.0–17.0)
Lymphocytes Relative: 23.2 % (ref 12.0–46.0)
Lymphs Abs: 1.3 10*3/uL (ref 0.7–4.0)
MCHC: 34.8 g/dL (ref 30.0–36.0)
MCV: 98.1 fl (ref 78.0–100.0)
Monocytes Absolute: 0.7 10*3/uL (ref 0.1–1.0)
Monocytes Relative: 12.5 % — ABNORMAL HIGH (ref 3.0–12.0)
Neutro Abs: 3.3 10*3/uL (ref 1.4–7.7)
Neutrophils Relative %: 60.4 % (ref 43.0–77.0)
Platelets: 210 10*3/uL (ref 150.0–400.0)
RBC: 3.6 Mil/uL — ABNORMAL LOW (ref 4.22–5.81)
RDW: 13.7 % (ref 11.5–15.5)
WBC: 5.4 10*3/uL (ref 4.0–10.5)

## 2017-03-12 MED ORDER — CYANOCOBALAMIN 1000 MCG/ML IJ SOLN
1000.0000 ug | Freq: Once | INTRAMUSCULAR | Status: AC
Start: 2017-03-12 — End: 2017-03-12
  Administered 2017-03-12: 1000 ug via INTRAMUSCULAR

## 2017-03-12 MED ORDER — OMEPRAZOLE 40 MG PO CPDR: DELAYED_RELEASE_CAPSULE | ORAL | 2 refills | Status: DC

## 2017-03-12 NOTE — Patient Instructions (Addendum)
Please go to the basement level to have your labs drawn.   We have sent the following medications to your pharmacy for you to pick up at your convenience: 1. Prilosec 40 mg.   You have been scheduled for a CT scan of the abdomen and pelvis at [facility] ([address]---this is in the same building as [facility]).   You are scheduled on Friday 03-15-2017 at 2:00 PM. You should arrive at 1:45 PM  to your appointment time for registration. Please follow the written instructions below on the day of your exam:  WARNING: IF YOU ARE ALLERGIC TO IODINE/X-RAY DYE, PLEASE NOTIFY RADIOLOGY IMMEDIATELY AT [phone]! YOU WILL BE GIVEN A 13 HOUR PREMEDICATION PREP.  1) Do not eat or drink anything after 10:00 am (4 hours prior to your test) 2) You have been given 2 bottles of oral contrast to drink. The solution may taste better if refrigerated, but do NOT add ice or any other liquid to this solution. Shake well before drinking.    Drink 1 bottle of contrast @ 12:00 noon (2 hours prior to your exam)  Drink 1 bottle of contrast @ 1:00 PM (1 hour prior to your exam)  You may take any medications as prescribed with a small amount of water except for the following: Metformin, Glucophage, Glucovance, Avandamet, Riomet, Fortamet, Actoplus Met, Janumet, Glumetza or Metaglip. The above medications must be held the day of the exam AND 48 hours after the exam.  The purpose of you drinking the oral contrast is to aid in the visualization of your intestinal tract. The contrast solution may cause some diarrhea. Before your exam is started, you will be given a small amount of fluid to drink. Depending on your individual set of symptoms, you may also receive an intravenous injection of x-ray contrast/dye. Plan on being at [facility] for 30 minutes or long, depending on the type of exam you are having performed.  If you have any questions regarding your exam or if you need to reschedule,  you may call the CT department at [phone] between the hours of 8:00 am and 5:00 pm, Monday-Friday.  ________________________________________________________________________

## 2017-03-12 NOTE — Progress Notes (Signed)
Medical screening examination/treatment/procedure(s) were performed by non-physician practitioner and as supervising physician I was immediately available for consultation/collaboration. I agree with above. Dachelle Molzahn, MD   

## 2017-03-12 NOTE — Progress Notes (Signed)
Subjective:    Patient ID: Justin [name], male    DOB: [birth year], 83 y.o.   MRN: [id number]  HPI Justin [name] is a very nice 83 year old white male, known previously to Dr. Deatra Ina in last seen in our office in 2010. He is referred today by Dr. Carollee Herter for evaluation of abdominal pain. Patient has history of previous TIA, adenomatous colon polyps, family history of colon cancer in his father and diverticulosis. He had colonoscopy in 2010 per Dr. Deatra Ina with finding of severe diverticulosis of the left colon and one 3 mm sessile polyp was removed from the transverse colon which was a tubular adenoma. He was not scheduled for routine follow up due to age. He had EGD in 2003 with finding of acute duodenitis. Patient says current symptoms started in June with right side and right back pain which she initially thought was a pulled muscle. He then developed a painful rash and was diagnosed with shingles. He says he had active symptoms for about 6 weeks. He started taking Tylenol on a regular basis, then stopped it abruptly because he felt like it was too high dose. He says during the entire episode of shingles he had a lot of pain on his right side which she described as burning in nature. After the rash subsided he continued to have some right-sided pain and was started on Prilosec 40 mg by mouth daily a couple of weeks ago. He says that does seem to be helping but has not completely relieved his discomfort. He did lose about 8 pounds, but is eating much better now and has no complaints of nausea or vomiting. He tends towards constipation long-term no recent changes and no melena or hematochezia. He does worry about colon cancer because of his family history, and is worried about persistence of the right-sided discomfort though improved.  Review of Systems Pertinent positive and negative review of systems were noted in the above HPI section.  All other review of systems was otherwise negative.  Outpatient  Encounter Prescriptions as of 03/12/2017  Medication Sig  . aspirin 81 MG tablet Take 81 mg by mouth daily.  . finasteride (PROSCAR) 5 MG tablet TAKE 1 TABLET (5 MG TOTAL) BY MOUTH DAILY.  Marland Kitchen omeprazole (PRILOSEC) 40 MG capsule Take 1 capsule every morning.  . simvastatin (ZOCOR) 20 MG tablet TAKE 1 TABLET BY MOUTH AT BEDTIME  . [DISCONTINUED] omeprazole (PRILOSEC) 40 MG capsule Take 1 capsule (40 mg total) by mouth daily.  . [DISCONTINUED] traMADol (ULTRAM) 50 MG tablet Take 1 tablet (50 mg total) by mouth every 12 (twelve) hours as needed.   Facility-Administered Encounter Medications as of 03/12/2017  Medication  . cyanocobalamin ((VITAMIN B-12)) injection 1,000 mcg  . [COMPLETED] cyanocobalamin ((VITAMIN B-12)) injection 1,000 mcg   Allergies  Allergen Reactions  . Prednisone     REACTION: RASH  . Inderal [Propranolol]     When taken for tremor it caused "streaks of pain " across forehead   Patient Active Problem List   Diagnosis Date Noted  . Preventative health care 10/27/2016  . Left flank pain 04/23/2016  . Subclinical hypothyroidism 10/25/2015  . Carpal tunnel syndrome 10/19/2015  . Pain, joint, hand, right 08/22/2015  . B12 deficiency 12/31/2011  . Benign prostatic hyperplasia with urinary obstruction 09/25/2011  . SKIN CANCER, HX OF 03/31/2010  . DIVERTICULOSIS, COLON 05/10/2008  . Hx of TIA (transient ischemic attack) and stroke 05/10/2008  . History of colonic polyps 05/10/2008  . NEPHROLITHIASIS, HX OF 05/10/2008  .  HYPERLIPIDEMIA 09/24/2007  . Anemia 12/25/2006   Social History   Social History  . Marital status: Married    Spouse name: N/A  . Number of children: 4  . Years of education: N/A   Occupational History  . Truck driver    Social History Main Topics  . Smoking status: Former Smoker    Types: Cigarettes, Pipe, Cigars    Quit date: 07/23/1976  . Smokeless tobacco: Former Systems developer    Types: Chew     Comment: smoked [id number], up to < 1 ppd  .  Alcohol use No     Comment: rare wine  . Drug use: No  . Sexual activity: Not on file   Other Topics Concern  . Not on file   Social History Narrative  . No narrative on file    Justin [name]'s family history includes Colon cancer in his mother; Colon polyps in his sister; Coronary artery disease in his brother; Diabetes in his brother and mother; Heart attack in his brother and unknown relative; Heart attack (age of onset: 42) in his father; Heart failure in his father; Other in his sister; Prostate cancer in his brother; Stroke in his brother and brother.      Objective:    Vitals:   03/12/17 0912  BP: 100/64  Pulse: 64    Physical Exam   well-developed elderly white male in no acute distress, accompanied by his wife both very pleasant blood pressure 100/64 pulse 64, BMI 25.5. HEENT ;nontraumatic normocephalic EOMI PERRLA sclera anicteric, Cardiovascular ;regular rate and rhythm with S1-S2 no murmur rub or gallop, Pulmonary ;clear bilaterally, Abdomen ;soft, is no focal tenderness, no palpable mass or hepatosplenomegaly bowel sounds are present, Rectal; exam not done, Ext;no clubbing cyanosis or edema skin warm and dry, Neuropsych; mood and affect appropriate      Assessment & Plan:   #81 83 year old white male status post herpes zoster involving the right upper quadrant right flank onset June 2018 with some persistent right upper quadrant discomfort improved with PPI therapy. I suspect that his right upper quadrant discomfort is neuropathic. Will rule out subacute intra-abdominal inflammatory process or malignancy. Cannot rule out gastritis/duodenitis #2 history of tubular adenomatous colon polyps-last colonoscopy 2010 #3 diverticulosis #4 history of TIA #5 positive family history of colon cancer  Plan; check CBC with differential, CMET Continue Prilosec 40 mg by mouth every morning 4 more weeks Schedule for CT scan of the abdomen and pelvis with contrast. We discussed  possible EGD if symptoms are persisting and above studies are unrevealing, though would like to avoid secondary to advanced age. Patient will be established with Dr. Loletha Carrow , further plans pending results of above  Sawsan Riggio Genia Harold PA-C 03/12/2017   Cc: Carollee Herter, Alferd Apa, *

## 2017-03-13 ENCOUNTER — Other Ambulatory Visit: Payer: Self-pay | Admitting: Family Medicine

## 2017-03-13 NOTE — Progress Notes (Signed)
Thank you for sending this case to me. I have reviewed the entire note, and the outlined plan seems appropriate.  I agree that this sounds like post-herpetic neuralgia.  CT is fine to rule out structural, inflammatory or neoplastic cause, but yield of endoscopic workup seems low in this scenario.  Wilfrid Lund, MD

## 2017-03-15 ENCOUNTER — Ambulatory Visit (INDEPENDENT_AMBULATORY_CARE_PROVIDER_SITE_OTHER)
Admission: RE | Admit: 2017-03-15 | Discharge: 2017-03-15 | Disposition: A | Discharge status: Home / Self Care | Payer: PPO | Source: Ambulatory Visit | Attending: Physician Assistant | Admitting: Physician Assistant

## 2017-03-15 DIAGNOSIS — R109 Unspecified abdominal pain: Secondary | ICD-10-CM | POA: Diagnosis not present

## 2017-03-15 DIAGNOSIS — R1011 Right upper quadrant pain: Secondary | ICD-10-CM | POA: Diagnosis not present

## 2017-03-15 MED ORDER — IOPAMIDOL (ISOVUE-300) INJECTION 61%: 100.0000 mL | Freq: Once | INTRAVENOUS | Status: DC | PRN

## 2017-03-15 NOTE — Telephone Encounter (Signed)
Faxed 90d till OV/thx dmf

## 2017-03-26 ENCOUNTER — Telehealth: Payer: Self-pay | Admitting: Family Medicine

## 2017-03-26 NOTE — Telephone Encounter (Signed)
Called patient's home number. Wife stated that phone disconnected as result of their satellite.  Wife handed her husband the phone.  Justin [name] stated that he believes the rash is the result of taking omeprazole.  Since he stopped medication, symptoms seem to be getting better.  Rash started on his feet and has worked it's way up to almost his waist. Per patient, the bumps are slightly raised, they itch and burn.  He started treating with salve and that helped. He is now using hydrocortisone and that seems to be helping as well.  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  He denied accompanying symptoms such chest pain, shortness of breath, tongue swelling, etc.  Pt stated he just wanted provider to know.

## 2017-03-26 NOTE — Telephone Encounter (Signed)
Caller name: Gerron  Relation to pt: self  Call back number: [phone] Pharmacy:  Reason for call: Pt came in office stating broke out with allergy or shingles all over his leg, pt states the cause of the allergy was from the meds Omeprazole 40 MG capsule that was prescribed  for burning in his stomach, pt states stop taking meds and is going to see provider next wk on Sept 11, 2018. Pt wanted provider to be informed. If any other instruction please let pt know. Please advise.

## 2017-03-26 NOTE — Telephone Encounter (Addendum)
Called to follow up with patient.  Pt states rash started about 1 1/2 week ago.  He has been self-treating with salve.  As he was talking, phone disconnected. Attempted to call patient back.  No answer.  Awaiting call back.    Per Kennyth Lose, pt did not want to schedule a sooner appt due to being scheduled to go out of town tomorrow and will not be back until Friday. He requested an appt for either Friday or Tuesday.   Dr. Etter Sjogren did not have any appts available on Friday and therefore patient scheduled to come in to see her Tuesday.

## 2017-03-26 NOTE — Telephone Encounter (Signed)
He should come in to be seen if it does not resolve

## 2017-03-28 NOTE — Telephone Encounter (Signed)
Pt has an appt 04/02/17.

## 2017-04-02 ENCOUNTER — Ambulatory Visit (INDEPENDENT_AMBULATORY_CARE_PROVIDER_SITE_OTHER): Payer: PPO | Admitting: Family Medicine

## 2017-04-02 ENCOUNTER — Ambulatory Visit: Payer: PPO | Admitting: Family Medicine

## 2017-04-02 ENCOUNTER — Encounter: Payer: Self-pay | Admitting: Family Medicine

## 2017-04-02 VITALS — BP 108/64 | HR 57 | Temp 97.6°F | Ht 70.0 in | Wt 177.0 lb

## 2017-04-02 DIAGNOSIS — K2970 Gastritis, unspecified, without bleeding: Secondary | ICD-10-CM

## 2017-04-02 DIAGNOSIS — T7840XA Allergy, unspecified, initial encounter: Secondary | ICD-10-CM | POA: Diagnosis not present

## 2017-04-02 MED ORDER — RANITIDINE HCL 150 MG PO TABS: 150.0000 mg | ORAL_TABLET | Freq: Two times a day (BID) | ORAL | 5 refills | Status: DC

## 2017-04-02 NOTE — Progress Notes (Signed)
Patient ID: Justin [name], male    DOB: 09/26/1924  Age: 83 y.o. MRN: [id number]    Subjective:  Subjective  HPI Justin [name] presents for f/u allergic reaction to omeprazole-- pt stopped the omeprazole and rash is resolving-- he broke out all over    Pt is taking benadryl and it is helping  Review of Systems  Constitutional: Negative for appetite change, diaphoresis, fatigue and unexpected weight change.  Eyes: Negative for pain, redness and visual disturbance.  Respiratory: Negative for cough, chest tightness, shortness of breath and wheezing.   Cardiovascular: Negative for chest pain, palpitations and leg swelling.  Endocrine: Negative for cold intolerance, heat intolerance, polydipsia, polyphagia and polyuria.  Genitourinary: Negative for difficulty urinating, dysuria and frequency.  Skin: Positive for rash. Negative for color change.  Neurological: Negative for dizziness, light-headedness, numbness and headaches.    History Past Medical History:  Diagnosis Date  . Anemia   . Basal cell cancer    Dr Wilhemina Bonito  . Benign prostatic hypertrophy    Dr Amalia Hailey  . Carpal tunnel syndrome 10/19/2015   Right  . Chronic kidney disease   . Colon polyp   . Diverticulosis   . Hyperlipidemia   . Kidney stones   . Shingles   . Stroke ([place])   . Transient ischemic attack    Dr Leonie Man    He has a past surgical history that includes Lithotripsy; Hemorrhoid surgery; Colonoscopy w/ polypectomy (01/2009); Cataract extraction, bilateral; and Prostate biopsy.   His family history includes Colon cancer in his mother; Colon polyps in his sister; Coronary artery disease in his brother; Diabetes in his brother and mother; Heart attack in his brother and unknown relative; Heart attack (age of onset: 11) in his father; Heart failure in his father; Other in his sister; Prostate cancer in his brother; Stroke in his brother and brother.He reports that he quit smoking about 40 years ago. His smoking use  included Cigarettes, Pipe, and Cigars. He has quit using smokeless tobacco. His smokeless tobacco use included Chew. He reports that he does not drink alcohol or use drugs.  Current Outpatient Prescriptions on File Prior to Visit  Medication Sig Dispense Refill  . aspirin 81 MG tablet Take 81 mg by mouth daily.    . finasteride (PROSCAR) 5 MG tablet TAKE 1 TABLET BY MOUTH EVERY DAY 90 tablet 0  . simvastatin (ZOCOR) 20 MG tablet TAKE 1 TABLET BY MOUTH AT BEDTIME 90 tablet 1   Current Facility-Administered Medications on File Prior to Visit  Medication Dose Route Frequency Provider Last Rate Last Dose  . cyanocobalamin ((VITAMIN B-12)) injection 1,000 mcg  1,000 mcg Intramuscular Once Hendricks Limes, MD         Objective:  Objective  Physical Exam  Constitutional: He is oriented to person, place, and time. Vital signs are normal. He appears well-developed and well-nourished. He is sleeping.  HENT:  Head: Normocephalic and atraumatic.  Mouth/Throat: Oropharynx is clear and moist.  Eyes: Pupils are equal, round, and reactive to light. EOM are normal.  Neck: Normal range of motion. Neck supple. No thyromegaly present.  Cardiovascular: Normal rate and regular rhythm.   No murmur heard. Pulmonary/Chest: Effort normal and breath sounds normal. No respiratory distress. He has no wheezes. He has no rales. He exhibits no tenderness.  Musculoskeletal: He exhibits no edema or tenderness.  Neurological: He is alert and oriented to person, place, and time.  Skin: Skin is warm and dry. Rash noted.  Psychiatric: He  has a normal mood and affect. His behavior is normal. Judgment and thought content normal.  Nursing note and vitals reviewed.  BP 108/64 (BP Location: Left Arm, Patient Position: Sitting, Cuff Size: Normal)   Pulse (!) 57   Temp 97.6 F (36.4 C) (Oral)   Ht 5\' 10"  (1.778 m)   Wt 177 lb (80.3 kg)   SpO2 98%   BMI 25.40 kg/m  Wt Readings from Last 3 Encounters:  04/02/17 177 lb  (80.3 kg)  03/12/17 178 lb 2 oz (80.8 kg)  02/22/17 180 lb 9.6 oz (81.9 kg)     Lab Results  Component Value Date   WBC 5.4 03/12/2017   HGB 12.3 (L) 03/12/2017   HCT 35.3 (L) 03/12/2017   PLT 210.0 03/12/2017   GLUCOSE 88 03/12/2017   CHOL 129 10/26/2016   TRIG 94 10/26/2016   HDL 51 10/26/2016   LDLCALC 59 10/26/2016   ALT 11 03/12/2017   AST 15 03/12/2017   NA 139 03/12/2017   K 3.8 03/12/2017   CL 104 03/12/2017   CREATININE 0.81 03/12/2017   BUN 19 03/12/2017   CO2 33 (H) 03/12/2017   TSH 5.17 (H) 10/30/2016   HGBA1C 5.4 01/10/2016   MICROALBUR 1.3 01/10/2016    Ct Abdomen Pelvis W Contrast  Result Date: 03/15/2017 CLINICAL DATA:  Right upper quadrant abdominal pain EXAM: CT ABDOMEN AND PELVIS WITH CONTRAST TECHNIQUE: Multidetector CT imaging of the abdomen and pelvis was performed using the standard protocol following bolus administration of intravenous contrast. CONTRAST:  100 mL Isovue 300 COMPARISON:  04/23/2016 FINDINGS: Lower chest: Mild subpleural reticulation/ fibrosis the lung bases. Hepatobiliary: Liver is within normal limits. Gallbladder underdistended. Pancreas: Within normal limits. Spleen: Within normal limits. Adrenals/Urinary Tract: Adrenal glands within normal limits. Left kidney is within normal limits. Low-lying right pelvic kidney with two nonobstructing right renal calculi measuring up to 9 mm in the right lower pole. No hydronephrosis. Bladder is within normal limits. Stomach/Bowel: Stomach is within normal limits. No evidence of bowel obstruction. Normal appendix (series 2/image 54). Left colonic diverticulosis, without evidence of diverticulitis. Vascular/Lymphatic: No evidence of abdominal aortic aneurysm. Atherosclerotic calcifications of the abdominal aorta and branch vessels. No suspicious abdominopelvic lymphadenopathy. Reproductive: Prostatomegaly with heterogeneous enhancement. Other: No abdominopelvic ascites. Small fat containing left inguinal  hernia. Musculoskeletal: Mild to moderate degenerative changes of the lumbar spine. IMPRESSION: Stable right pelvic kidney with two nonobstructing calculi measuring up to 9 mm in the lower pole. No ureteral or bladder calculi. No hydronephrosis. Colonic diverticulosis, without evidence of diverticulitis. No evidence of bowel obstruction.  Normal appendix. Additional stable ancillary findings as above. Electronically Signed   By: Julian Hy M.D.   On: 03/15/2017 16:25     Assessment & Plan:  Plan  I have discontinued Mr. Chaves omeprazole. I am also having him start on ranitidine. Additionally, I am having him maintain his aspirin, simvastatin, and finasteride. We will continue to administer cyanocobalamin.  Meds ordered this encounter  Medications  . ranitidine (ZANTAC) 150 MG tablet    Sig: Take 1 tablet (150 mg total) by mouth 2 (two) times daily.    Dispense:  60 tablet    Refill:  5    Problem List Items Addressed This Visit      Unprioritized   Allergic reaction caused by a drug    Drug stopped  con't benadryl-- he did not want prednisone        Other Visit Diagnoses    Gastritis without  bleeding, unspecified chronicity, unspecified gastritis type    -  Primary   Relevant Medications   ranitidine (ZANTAC) 150 MG tablet      Follow-up: Return in about 6 months (around 09/30/2017), or if symptoms worsen or fail to improve.  [name], DO

## 2017-04-02 NOTE — Patient Instructions (Signed)
Drug Allergy A drug allergy is when your body reacts in a bad way to a medicine. This can be life-threatening. If you have an allergic reaction, get help right away, even if the reaction seems gentle (mild). Your doctor may teach you how to use an allergy kit (anaphylaxis kit) and how to give yourself an allergy shot (epinephrine injection). You can give yourself an allergy shot with what is commonly called an auto-injector "pen." Symptoms of a Gentle Reaction  A stuffy nose (nasal congestion).  Tingling in your mouth.  An itchy, red rash. Symptoms of a Very Bad Reaction  Swelling of your eyes, lips, face, or tongue.  Swelling of the back of your mouth and your throat.  Breathing loudly (wheezing).  A hoarse voice.  Itchy, red, swollen areas of skin (hives).  Dizziness or light-headedness.  Passing out (fainting).  Feeling worried or nervous (anxiety).  Feeling confused.  Pain in your belly (abdomen).  Trouble with breathing, talking, or swallowing.  A tight feeling in your chest.  Fast or uneven heartbeats (palpitations).  Throwing up (vomiting).  Watery poop (diarrhea). Follow these instructions at home: If You Have a Very Bad Allergy:  Always keep an auto-injector pen or your allergy kit with you. These could save your life. Use them as told by your doctor.  Make sure that you, the people who live with you, and your employer know: ? How to use your allergy kit. ? How to use an auto-injector pen to give you an allergy shot.  If you used your auto-injector pen: ? Get more medicine for it right away. This is important in case you have another reaction. ? Get help right away.  Wear a bracelet or necklace that says you have an allergy, if your doctor tells you to do this. General instructions  Avoid medicines that you are allergic to.  Take over-the-counter and prescription medicines only as told by your doctor.  Do not drive until your doctor says it is  safe.  If you have itchy, red, swollen areas of skin or a rash: ? Use over-the-counter medicine (antihistamine) as told by your doctor. ? Put cold, wet cloths (cold compresses) on your skin. ? Take baths or showers in cool water. Avoid hot water.  If you had tests done, it is your responsibility to get your test results. Ask your doctor when your results will be ready.  Tell any doctors who care for you that you have a drug allergy.  Keep all follow-up visits as told by your doctor. This is important. Contact a doctor if:  You start to have any of these: ? A stuffy nose. ? Tingling in your mouth. ? An itchy, red rash.  You have symptoms that last more than 2 days after your reaction.  Your symptoms get worse.  You get new symptoms. Get help right away if:  You had to use your auto-injector pen. You must go to the emergency room even if the medicine seems to be working.  You have any of these: ? Swelling in your eyes, lips, face, or tongue. ? Swelling in the back of your mouth or your throat. ? Loud breathing. ? A hoarse voice. ? Itchy, red, swollen areas of skin. ? Trouble with breathing, talking, or swallowing. ? A tight feeling in your chest. ? A fast heartbeat.  You have throwing up that gets very bad.  You have watery poop that gets very bad.  You feel dizzy or light-headed.  You   pass out. These symptoms may be an emergency. Do not wait to see if the symptoms will go away. Use your auto-injector pen or allergy kit as you have been told. Get medical help right away. Call your local emergency services (911 in the [place] Do not drive yourself to the hospital. This information is not intended to replace advice given to you by your health care provider. Make sure you discuss any questions you have with your health care provider. Document Released: 08/16/2004 Document Revised: 12/15/2015 Document Reviewed: 02/08/2015 Elsevier Interactive Patient Education  2018 [employer]  

## 2017-04-03 DIAGNOSIS — T7840XA Allergy, unspecified, initial encounter: Secondary | ICD-10-CM | POA: Insufficient documentation

## 2017-04-03 NOTE — Assessment & Plan Note (Signed)
Drug stopped  con't benadryl-- he did not want prednisone

## 2017-04-10 ENCOUNTER — Encounter: Payer: Self-pay | Admitting: Internal Medicine

## 2017-04-10 ENCOUNTER — Ambulatory Visit (INDEPENDENT_AMBULATORY_CARE_PROVIDER_SITE_OTHER): Payer: PPO | Admitting: Internal Medicine

## 2017-04-10 VITALS — BP 120/60 | HR 64 | Ht 70.0 in | Wt 178.0 lb

## 2017-04-10 DIAGNOSIS — F418 Other specified anxiety disorders: Secondary | ICD-10-CM

## 2017-04-10 DIAGNOSIS — Z86010 Personal history of colonic polyps: Secondary | ICD-10-CM | POA: Diagnosis not present

## 2017-04-10 DIAGNOSIS — K625 Hemorrhage of anus and rectum: Secondary | ICD-10-CM

## 2017-04-10 NOTE — Progress Notes (Signed)
Justin [name] 83 y.o. 1924/11/09 [id number]  Assessment & Plan:   Encounter Diagnoses  Name Primary?  . Rectal bleeding Yes  . Hx of adenomatous colonic polyps   . Anxiety about health    We had a long discussion today about the pros and cons of pursuing endoscopic evaluation in elderly patients. He is worried about the possibility of developing and dying from colon cancer. It seems very unlikely but with his overall history and his fitness at his age it is not unreasonable to pursue a colonoscopy. I told him I thought that given that he has intermittent rectal bleeding which may be hemorrhoidal, that increases the likelihood of a positive Cologuard or fecal occult blood test which would indicate a need for colonoscopy. After fully discussing the risks benefits and indications of repeating a colonoscopy we have decided to do so. I think it will put his mind at ease, he should be able to tolerate it though I will be extra cautious in his case and if there is difficulty with instrumentation of the colon would not force the issue if I can help it. He does understand there is a risk of puncture perforation or need for surgery etc.  He has says he has plans to get the new shingles vaccine when it becomes available again.  I appreciate the opportunity to care for this patient. CC: [name], DO     Subjective:   Chief Complaint:  HPI The patient is a very nice man here with his wife who is also a patient of mine, with concerns about having another colonoscopy and a follow-up after he had right upper quadrant pain. He had shingles, and was seen here with right upper quadrant pain and some loss of weight, a CT scan of the abdomen and pelvis was unremarkable other than a known pelvic kidney and some intra-kidney stones. That pain is resolving. He is concerned about whether or not he should have another colonoscopy has a history of colon polyps in his mother and another family  member had colon cancer. Upon further questioning he does have some intermittent rectal bleeding. There is a history of hemorrhoid surgery in his 7s when he was in the [employer]. 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 He has some intermittent constipation that is chronic. He takes laxatives. He was asking about whether to have a Cologuard test. His primary care provider has ordered Hemoccult tests but he has not done.  Colonoscopy in 2010 showed a diminutive adenoma he has a history of adenomas prior to that as well. Hemoglobin in 12 range going back at least 1 year Wt Readings from Last 3 Encounters:  04/10/17 178 lb (80.7 kg)  04/02/17 177 lb (80.3 kg)  03/12/17 178 lb 2 oz (80.8 kg)    Allergies  Allergen Reactions  . Prednisone Hives    Unknown REACTION: RASH  . Inderal [Propranolol]     When taken for tremor it caused "streaks of pain " across forehead  . Omeprazole Rash   Current Meds  Medication Sig  . aspirin 81 MG tablet Take 81 mg by mouth daily.  . finasteride (PROSCAR) 5 MG tablet TAKE 1 TABLET BY MOUTH EVERY DAY  . ranitidine (ZANTAC) 150 MG tablet Take 1 tablet (150 mg total) by mouth 2 (two) times daily.  . simvastatin (ZOCOR) 20 MG tablet TAKE 1 TABLET BY MOUTH AT BEDTIME   Current Facility-Administered Medications for the 04/10/17 encounter (  Office Visit) with Gatha Mayer, MD  Medication  . cyanocobalamin ((VITAMIN B-12)) injection 1,000 mcg   Past Medical History:  Diagnosis Date  . Anemia   . Basal cell cancer    Dr Wilhemina Bonito  . Benign prostatic hypertrophy    Dr Amalia Hailey  . Carpal tunnel syndrome 10/19/2015   Right  . Chronic kidney disease   . Colon polyp   . Diverticulosis   . Hyperlipidemia   . Kidney stones   . Shingles   . Stroke ([place])   . Transient ischemic attack    Dr Leonie Man   Past Surgical History:  Procedure Laterality Date  . CATARACT EXTRACTION, BILATERAL    . COLONOSCOPY W/ POLYPECTOMY  01/2009   adenomatous, Dr.  Alben Spittle  . HEMORRHOID SURGERY    . LITHOTRIPSY      X 2;DrAmalia Hailey  . PROSTATE BIOPSY     X2   Social History   Social History  . Marital status: Married    Spouse name: N/A  . Number of children: 4  . Years of education: N/A   Occupational History  . Truck driver [employer]   Social History Main Topics  . Smoking status: Former Smoker    Types: Cigarettes, Pipe, Cigars    Quit date: 07/23/1976  . Smokeless tobacco: Former Systems developer    Types: Chew     Comment: smoked [phone], up to < 1 ppd  . Alcohol use No     Comment: rare wine  . Drug use: No  . Sexual activity: Not on file   Other Topics Concern  . 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   Review of Systems As above  Objective:   Physical Exam BP 120/60   Pulse 64   Ht 5\' 10"  (1.778 m)   Wt 178 lb (80.7 kg)   BMI 25.54 kg/m   younger looking than stated age and vigorous The eyes are anicteric Lungs are clear Heart S1-S2 no rubs murmurs or gallops Abdomen is soft and nontender there is faint scarring from his zoster rash in the right upper quadrant area He is appropriate with a normal affect and is alert and oriented 3

## 2017-04-10 NOTE — Patient Instructions (Signed)
If you are age 81 or older, your body mass index should be between 23-30. Your Body mass index is 25.54 kg/m. If this is out of the aforementioned range listed, please consider follow up with your Primary Care Provider.  If you are age 31 or younger, your body mass index should be between 19-25. Your Body mass index is 25.54 kg/m. If this is out of the aformentioned range listed, please consider follow up with your Primary Care Provider.   You have been scheduled for a colonoscopy. Please follow written instructions given to you at your visit today.  Please pick up your prep supplies at the pharmacy within the next 1-3 days. If you use inhalers (even only as needed), please bring them with you on the day of your procedure. Your physician has requested that you go to www.startemmi.com and enter the access code given to you at your visit today. This web site gives a general overview about your procedure. However, you should still follow specific instructions given to you by our office regarding your preparation for the procedure.  Thank you.

## 2017-04-26 ENCOUNTER — Encounter: Payer: Self-pay | Admitting: Family Medicine

## 2017-04-26 ENCOUNTER — Ambulatory Visit (INDEPENDENT_AMBULATORY_CARE_PROVIDER_SITE_OTHER): Payer: PPO | Admitting: Family Medicine

## 2017-04-26 VITALS — BP 126/72 | HR 88 | Wt 178.2 lb

## 2017-04-26 DIAGNOSIS — E860 Dehydration: Secondary | ICD-10-CM

## 2017-04-26 DIAGNOSIS — Z23 Encounter for immunization: Secondary | ICD-10-CM | POA: Diagnosis not present

## 2017-04-26 DIAGNOSIS — E538 Deficiency of other specified B group vitamins: Secondary | ICD-10-CM | POA: Diagnosis not present

## 2017-04-26 DIAGNOSIS — R42 Dizziness and giddiness: Secondary | ICD-10-CM

## 2017-04-26 LAB — COMPREHENSIVE METABOLIC PANEL
ALT: 10 U/L (ref 0–53)
AST: 14 U/L (ref 0–37)
Albumin: 3.8 g/dL (ref 3.5–5.2)
Alkaline Phosphatase: 69 U/L (ref 39–117)
BUN: 19 mg/dL (ref 6–23)
CO2: 31 mEq/L (ref 19–32)
Calcium: 8.6 mg/dL (ref 8.4–10.5)
Chloride: 103 mEq/L (ref 96–112)
Creatinine, Ser: 0.83 mg/dL (ref 0.40–1.50)
GFR: 92.54 mL/min (ref >60.00)
Glucose, Bld: 81 mg/dL (ref 70–99)
Potassium: 3.5 mEq/L (ref 3.5–5.1)
Sodium: 139 mEq/L (ref 135–145)
Total Bilirubin: 0.4 mg/dL (ref 0.2–1.2)
Total Protein: 7.8 g/dL (ref 6.0–8.3)

## 2017-04-26 LAB — CBC WITH DIFFERENTIAL/PLATELET
Basophils Absolute: 0 10*3/uL (ref 0.0–0.1)
Basophils Relative: 0.8 % (ref 0.0–3.0)
Eosinophils Absolute: 0.2 10*3/uL (ref 0.0–0.7)
Eosinophils Relative: 2.9 % (ref 0.0–5.0)
HCT: 34.3 % — ABNORMAL LOW (ref 39.0–52.0)
Hemoglobin: 11.9 g/dL — ABNORMAL LOW (ref 13.0–17.0)
Lymphocytes Relative: 23.5 % (ref 12.0–46.0)
Lymphs Abs: 1.2 10*3/uL (ref 0.7–4.0)
MCHC: 34.6 g/dL (ref 30.0–36.0)
MCV: 98.6 fl (ref 78.0–100.0)
Monocytes Absolute: 0.7 10*3/uL (ref 0.1–1.0)
Monocytes Relative: 13.4 % — ABNORMAL HIGH (ref 3.0–12.0)
Neutro Abs: 3.2 10*3/uL (ref 1.4–7.7)
Neutrophils Relative %: 59.4 % (ref 43.0–77.0)
Platelets: 196 10*3/uL (ref 150.0–400.0)
RBC: 3.48 Mil/uL — ABNORMAL LOW (ref 4.22–5.81)
RDW: 13.3 % (ref 11.5–15.5)
WBC: 5.3 10*3/uL (ref 4.0–10.5)

## 2017-04-26 LAB — POC URINALSYSI DIPSTICK (AUTOMATED)
Bilirubin, UA: NEGATIVE
Blood, UA: NEGATIVE
Glucose, UA: NEGATIVE
Ketones, UA: NEGATIVE
Leukocytes, UA: NEGATIVE
Nitrite, UA: NEGATIVE
Protein, UA: NEGATIVE
Spec Grav, UA: 1.025 (ref 1.010–1.025)
Urobilinogen, UA: 0.2 E.U./dL
pH, UA: 6 (ref 5.0–8.0)

## 2017-04-26 MED ORDER — CYANOCOBALAMIN 1000 MCG/ML IJ SOLN
1000.0000 ug | INTRAMUSCULAR | Status: DC
  Administered 2017-04-26: 1000 ug via INTRAMUSCULAR

## 2017-04-26 NOTE — Assessment & Plan Note (Addendum)
Instructed pt to drink at least 4 water bottles 16.9oz a day-- he had previously only had 1-2 8 oz cups in 9 hours
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
## 2017-04-26 NOTE — Patient Instructions (Signed)
Dehydration, Adult Dehydration is a condition in which there is not enough fluid or water in the body. This happens when you lose more fluids than you take in. Important organs, such as the kidneys, brain, and heart, cannot function without a proper amount of fluids. Any loss of fluids from the body can lead to dehydration. Dehydration can range from mild to severe. This condition should be treated right away to prevent it from becoming severe. What are the causes? This condition may be caused by:  Vomiting.  Diarrhea.  Excessive sweating, such as from heat exposure or exercise.  Not drinking enough fluid, especially: ? When ill. ? While doing activity that requires a lot of energy.  Excessive urination.  Fever.  Infection.  Certain medicines, such as medicines that cause the body to lose excess fluid (diuretics).  Inability to access safe drinking water.  Reduced physical ability to get adequate water and food.  What increases the risk? This condition is more likely to develop in people:  Who have a poorly controlled long-term (chronic) illness, such as diabetes, heart disease, or kidney disease.  Who are age 65 or older.  Who are disabled.  Who live in a place with high altitude.  Who play endurance sports.  What are the signs or symptoms? Symptoms of mild dehydration may include:  Thirst.  Dry lips.  Slightly dry mouth.  Dry, warm skin.  Dizziness. Symptoms of moderate dehydration may include:  Very dry mouth.  Muscle cramps.  Dark urine. Urine may be the color of tea.  Decreased urine production.  Decreased tear production.  Heartbeat that is irregular or faster than normal (palpitations).  Headache.  Light-headedness, especially when you stand up from a sitting position.  Fainting (syncope). Symptoms of severe dehydration may include:  Changes in skin, such as: ? Cold and clammy skin. ? Blotchy (mottled) or pale skin. ? Skin that does  not quickly return to normal after being lightly pinched and released (poor skin turgor).  Changes in body fluids, such as: ? Extreme thirst. ? No tear production. ? Inability to sweat when body temperature is high, such as in hot weather. ? Very little urine production.  Changes in vital signs, such as: ? Weak pulse. ? Pulse that is more than 100 beats a minute when sitting still. ? Rapid breathing. ? Low blood pressure.  Other changes, such as: ? Sunken eyes. ? Cold hands and feet. ? Confusion. ? Lack of energy (lethargy). ? Difficulty waking up from sleep. ? Short-term weight loss. ? Unconsciousness. How is this diagnosed? This condition is diagnosed based on your symptoms and a physical exam. Blood and urine tests may be done to help confirm the diagnosis. How is this treated? Treatment for this condition depends on the severity. Mild or moderate dehydration can often be treated at home. Treatment should be started right away. Do not wait until dehydration becomes severe. Severe dehydration is an emergency and it needs to be treated in a hospital. Treatment for mild dehydration may include:  Drinking more fluids.  Replacing salts and minerals in your blood (electrolytes) that you may have lost. Treatment for moderate dehydration may include:  Drinking an oral rehydration solution (ORS). This is a drink that helps you replace fluids and electrolytes (rehydrate). It can be found at pharmacies and retail stores. Treatment for severe dehydration may include:  Receiving fluids through an IV tube.  Receiving an electrolyte solution through a feeding tube that is passed through your nose   and into your stomach (nasogastric tube, or NG tube).  Correcting any abnormalities in electrolytes.  Treating the underlying cause of dehydration. Follow these instructions at home:  If directed by your health care provider, drink an ORS: ? Make an ORS by following instructions on the  package. ? Start by drinking small amounts, about  cup (120 mL) every 5-10 minutes. ? Slowly increase how much you drink until you have taken the amount recommended by your health care provider.  Drink enough clear fluid to keep your urine clear or pale yellow. If you were told to drink an ORS, finish the ORS first, then start slowly drinking other clear fluids. Drink fluids such as: ? Water. Do not drink only water. Doing that can lead to having too little salt (sodium) in the body (hyponatremia). ? Ice chips. ? Fruit juice that you have added water to (diluted fruit juice). ? Low-calorie sports drinks.  Avoid: ? Alcohol. ? Drinks that contain a lot of sugar. These include high-calorie sports drinks, fruit juice that is not diluted, and soda. ? Caffeine. ? Foods that are greasy or contain a lot of fat or sugar.  Take over-the-counter and prescription medicines only as told by your health care provider.  Do not take sodium tablets. This can lead to having too much sodium in the body (hypernatremia).  Eat foods that contain a healthy balance of electrolytes, such as bananas, oranges, potatoes, tomatoes, and spinach.  Keep all follow-up visits as told by your health care provider. This is important. Contact a health care provider if:  You have abdominal pain that: ? Gets worse. ? Stays in one area (localizes).  You have a rash.  You have a stiff neck.  You are more irritable than usual.  You are sleepier or more difficult to wake up than usual.  You feel weak or dizzy.  You feel very thirsty.  You have urinated only a small amount of very dark urine over 6-8 hours. Get help right away if:  You have symptoms of severe dehydration.  You cannot drink fluids without vomiting.  Your symptoms get worse with treatment.  You have a fever.  You have a severe headache.  You have vomiting or diarrhea that: ? Gets worse. ? Does not go away.  You have blood or green matter  (bile) in your vomit.  You have blood in your stool. This may cause stool to look black and tarry.  You have not urinated in 6-8 hours.  You faint.  Your heart rate while sitting still is over 100 beats a minute.  You have trouble breathing. This information is not intended to replace advice given to you by your health care provider. Make sure you discuss any questions you have with your health care provider. Document Released: 07/09/2005 Document Revised: 02/03/2016 Document Reviewed: 09/02/2015 Elsevier Interactive Patient Education  2018 [employer]  

## 2017-04-26 NOTE — Assessment & Plan Note (Signed)
?   Dehydration and low blood sugar from no eating or drinking in over 9 hours Instructed pt to eat q3 h  And keep water in truck with him  Check labs

## 2017-04-30 ENCOUNTER — Ambulatory Visit: Payer: PPO | Admitting: Family Medicine

## 2017-04-30 DIAGNOSIS — D2272 Melanocytic nevi of left lower limb, including hip: Secondary | ICD-10-CM | POA: Diagnosis not present

## 2017-04-30 DIAGNOSIS — D692 Other nonthrombocytopenic purpura: Secondary | ICD-10-CM | POA: Diagnosis not present

## 2017-04-30 DIAGNOSIS — Z85828 Personal history of other malignant neoplasm of skin: Secondary | ICD-10-CM | POA: Diagnosis not present

## 2017-04-30 DIAGNOSIS — L570 Actinic keratosis: Secondary | ICD-10-CM | POA: Diagnosis not present

## 2017-04-30 DIAGNOSIS — L820 Inflamed seborrheic keratosis: Secondary | ICD-10-CM | POA: Diagnosis not present

## 2017-04-30 DIAGNOSIS — L821 Other seborrheic keratosis: Secondary | ICD-10-CM | POA: Diagnosis not present

## 2017-04-30 DIAGNOSIS — D1801 Hemangioma of skin and subcutaneous tissue: Secondary | ICD-10-CM | POA: Diagnosis not present

## 2017-06-04 ENCOUNTER — Ambulatory Visit: Payer: PPO | Admitting: Family Medicine

## 2017-06-04 VITALS — BP 114/70 | HR 61 | Temp 98.0°F | Ht 70.0 in | Wt 180.2 lb

## 2017-06-04 DIAGNOSIS — J029 Acute pharyngitis, unspecified: Secondary | ICD-10-CM

## 2017-06-04 DIAGNOSIS — E538 Deficiency of other specified B group vitamins: Secondary | ICD-10-CM | POA: Diagnosis not present

## 2017-06-04 DIAGNOSIS — J069 Acute upper respiratory infection, unspecified: Secondary | ICD-10-CM

## 2017-06-04 LAB — POCT RAPID STREP A (OFFICE): Rapid Strep A Screen: NEGATIVE

## 2017-06-04 MED ORDER — LORATADINE 10 MG PO TABS: 10.0000 mg | ORAL_TABLET | Freq: Every day | ORAL | 11 refills | Status: DC

## 2017-06-04 MED ORDER — CYANOCOBALAMIN 1000 MCG/ML IJ SOLN
1000.0000 ug | Freq: Once | INTRAMUSCULAR | Status: AC
  Administered 2017-06-04: 1000 ug via INTRAMUSCULAR

## 2017-06-04 NOTE — Patient Instructions (Signed)
Upper Respiratory Infection, Adult Most upper respiratory infections (URIs) are caused by a virus. A URI affects the nose, throat, and upper air passages. The most common type of URI is often called "the common cold." Follow these instructions at home:  Take medicines only as told by your doctor.  Gargle warm saltwater or take cough drops to comfort your throat as told by your doctor.  Use a warm mist humidifier or inhale steam from a shower to increase air moisture. This may make it easier to breathe.  Drink enough fluid to keep your pee (urine) clear or pale yellow.  Eat soups and other clear broths.  Have a healthy diet.  Rest as needed.  Go back to work when your fever is gone or your doctor says it is okay. ? You may need to stay home longer to avoid giving your URI to others. ? You can also wear a face mask and wash your hands often to prevent spread of the virus.  Use your inhaler more if you have asthma.  Do not use any tobacco products, including cigarettes, chewing tobacco, or electronic cigarettes. If you need help quitting, ask your doctor. Contact a doctor if:  You are getting worse, not better.  Your symptoms are not helped by medicine.  You have chills.  You are getting more short of breath.  You have brown or red mucus.  You have yellow or brown discharge from your nose.  You have pain in your face, especially when you bend forward.  You have a fever.  You have puffy (swollen) neck glands.  You have pain while swallowing.  You have white areas in the back of your throat. Get help right away if:  You have very bad or constant: ? Headache. ? Ear pain. ? Pain in your forehead, behind your eyes, and over your cheekbones (sinus pain). ? Chest pain.  You have long-lasting (chronic) lung disease and any of the following: ? Wheezing. ? Long-lasting cough. ? Coughing up blood. ? A change in your usual mucus.  You have a stiff neck.  You have  changes in your: ? Vision. ? Hearing. ? Thinking. ? Mood. This information is not intended to replace advice given to you by your health care provider. Make sure you discuss any questions you have with your health care provider. Document Released: 12/26/2007 Document Revised: 03/11/2016 Document Reviewed: 10/14/2013 Elsevier Interactive Patient Education  2018 [employer]  

## 2017-06-04 NOTE — Progress Notes (Signed)
Patient ID: Justin [name], male    DOB: 09-18-1924  Age: 83 y.o. MRN: [id number]    Subjective:  Subjective  HPI Justin [name] presents for sore throat x 3 weeks ---+ cough--productive , no fever , no body aches   Review of Systems  Constitutional: Positive for chills. Negative for fever.  HENT: Positive for congestion, postnasal drip, rhinorrhea, sneezing and sore throat. Negative for sinus pressure.   Respiratory: Positive for cough, chest tightness, shortness of breath and wheezing.   Cardiovascular: Negative for chest pain, palpitations and leg swelling.  Allergic/Immunologic: Negative for environmental allergies.    History Past Medical History:  Diagnosis Date  . Anemia   . Basal cell cancer    Dr Wilhemina Bonito  . Benign prostatic hypertrophy    Dr Amalia Hailey  . Carpal tunnel syndrome 10/19/2015   Right  . Chronic kidney disease   . Colon polyp   . Diverticulosis   . Hyperlipidemia   . Kidney stones   . Shingles   . Stroke ([place])   . Transient ischemic attack    Dr Leonie Man    He has a past surgical history that includes Lithotripsy; Hemorrhoid surgery; Colonoscopy w/ polypectomy (01/2009); Cataract extraction, bilateral; and Prostate biopsy.   His family history includes Colon cancer in his mother; Colon polyps in his sister; Coronary artery disease in his brother; Diabetes in his brother and mother; Heart attack in his brother and unknown relative; Heart attack (age of onset: 21) in his father; Heart failure in his father; Other in his sister; Prostate cancer in his brother; Stroke in his brother and brother.He reports that he quit smoking about 40 years ago. His smoking use included cigarettes, pipe, and cigars. He has quit using smokeless tobacco. His smokeless tobacco use included chew. He reports that he does not drink alcohol or use drugs.  Current Outpatient Medications on File Prior to Visit  Medication Sig Dispense Refill  . aspirin 81 MG tablet Take 81 mg by mouth  daily.    . finasteride (PROSCAR) 5 MG tablet TAKE 1 TABLET BY MOUTH EVERY DAY 90 tablet 0  . ranitidine (ZANTAC) 150 MG tablet Take 1 tablet (150 mg total) by mouth 2 (two) times daily. 60 tablet 5  . simvastatin (ZOCOR) 20 MG tablet TAKE 1 TABLET BY MOUTH AT BEDTIME 90 tablet 1   Current Facility-Administered Medications on File Prior to Visit  Medication Dose Route Frequency Provider Last Rate Last Dose  . cyanocobalamin ((VITAMIN B-12)) injection 1,000 mcg  1,000 mcg Intramuscular Once Hendricks Limes, MD      . cyanocobalamin ((VITAMIN B-12)) injection 1,000 mcg  1,000 mcg Intramuscular Q30 days Carollee Herter, Chip Canepa R, DO   1,000 mcg at 04/26/17 1323     Objective:  Objective  Physical Exam  Constitutional: He is oriented to person, place, and time. He appears well-developed and well-nourished.  HENT:  Right Ear: Tympanic membrane and external ear normal.  Left Ear: Tympanic membrane and external ear normal.  Nose: Nose normal.  Mouth/Throat: Posterior oropharyngeal erythema present. No posterior oropharyngeal edema.  + PND + errythema  Eyes: Conjunctivae are normal. Right eye exhibits no discharge. Left eye exhibits no discharge.  Cardiovascular: Normal rate, regular rhythm and normal heart sounds.  No murmur heard. Pulmonary/Chest: Effort normal and breath sounds normal. No respiratory distress. He has no wheezes. He has no rales. He exhibits no tenderness.  Musculoskeletal: He exhibits no edema.  Lymphadenopathy:    He has cervical adenopathy.  Neurological: He is alert and oriented to person, place, and time.  Nursing note and vitals reviewed.  BP 114/70   Pulse 61   Temp 98 F (36.7 C) (Oral)   Ht 5\' 10"  (1.778 m)   Wt 180 lb 3.2 oz (81.7 kg)   SpO2 99%   BMI 25.86 kg/m  Wt Readings from Last 3 Encounters:  06/04/17 180 lb 3.2 oz (81.7 kg)  04/26/17 178 lb 3.2 oz (80.8 kg)  04/10/17 178 lb (80.7 kg)     Lab Results  Component Value Date   WBC 5.3  04/26/2017   HGB 11.9 (L) 04/26/2017   HCT 34.3 (L) 04/26/2017   PLT 196.0 04/26/2017   GLUCOSE 81 04/26/2017   CHOL 129 10/26/2016   TRIG 94 10/26/2016   HDL 51 10/26/2016   LDLCALC 59 10/26/2016   ALT 10 04/26/2017   AST 14 04/26/2017   NA 139 04/26/2017   K 3.5 04/26/2017   CL 103 04/26/2017   CREATININE 0.83 04/26/2017   BUN 19 04/26/2017   CO2 31 04/26/2017   TSH 5.17 (H) 10/30/2016   HGBA1C 5.4 01/10/2016   MICROALBUR 1.3 01/10/2016    Ct Abdomen Pelvis W Contrast  Result Date: 03/15/2017 CLINICAL DATA:  Right upper quadrant abdominal pain EXAM: CT ABDOMEN AND PELVIS WITH CONTRAST TECHNIQUE: Multidetector CT imaging of the abdomen and pelvis was performed using the standard protocol following bolus administration of intravenous contrast. CONTRAST:  100 mL Isovue 300 COMPARISON:  04/23/2016 FINDINGS: Lower chest: Mild subpleural reticulation/ fibrosis the lung bases. Hepatobiliary: Liver is within normal limits. Gallbladder underdistended. Pancreas: Within normal limits. Spleen: Within normal limits. Adrenals/Urinary Tract: Adrenal glands within normal limits. Left kidney is within normal limits. Low-lying right pelvic kidney with two nonobstructing right renal calculi measuring up to 9 mm in the right lower pole. No hydronephrosis. Bladder is within normal limits. Stomach/Bowel: Stomach is within normal limits. No evidence of bowel obstruction. Normal appendix (series 2/image 54). Left colonic diverticulosis, without evidence of diverticulitis. Vascular/Lymphatic: No evidence of abdominal aortic aneurysm. Atherosclerotic calcifications of the abdominal aorta and branch vessels. No suspicious abdominopelvic lymphadenopathy. Reproductive: Prostatomegaly with heterogeneous enhancement. Other: No abdominopelvic ascites. Small fat containing left inguinal hernia. Musculoskeletal: Mild to moderate degenerative changes of the lumbar spine. IMPRESSION: Stable right pelvic kidney with two  nonobstructing calculi measuring up to 9 mm in the lower pole. No ureteral or bladder calculi. No hydronephrosis. Colonic diverticulosis, without evidence of diverticulitis. No evidence of bowel obstruction.  Normal appendix. Additional stable ancillary findings as above. Electronically Signed   By: Julian Hy M.D.   On: 03/15/2017 16:25     Assessment & Plan:  Plan  I am having [name] start on loratadine. I am also having him maintain his aspirin, simvastatin, finasteride, and ranitidine. We administered cyanocobalamin. We will continue to administer cyanocobalamin and cyanocobalamin.  Meds ordered this encounter  Medications  . loratadine (CLARITIN) 10 MG tablet    Sig: Take 1 tablet (10 mg total) daily by mouth.    Dispense:  30 tablet    Refill:  11  . cyanocobalamin ((VITAMIN B-12)) injection 1,000 mcg    Problem List Items Addressed This Visit      Unprioritized   B12 deficiency   Relevant Medications   cyanocobalamin ((VITAMIN B-12)) injection 1,000 mcg (Completed)    Other Visit Diagnoses    Sore throat    -  Primary   Relevant Medications   loratadine (CLARITIN) 10 MG tablet  Other Relevant Orders   Culture, Group A Strep   POCT rapid strep A (Completed)   Viral upper respiratory tract infection       Relevant Medications   loratadine (CLARITIN) 10 MG tablet      Follow-up: Return if symptoms worsen or fail to improve.  [name], DO

## 2017-06-05 ENCOUNTER — Encounter: Payer: Self-pay | Admitting: Family Medicine

## 2017-06-05 LAB — CULTURE, GROUP A STREP
MICRO NUMBER:: 81277733
SPECIMEN QUALITY:: ADEQUATE

## 2017-06-07 ENCOUNTER — Ambulatory Visit: Payer: PPO

## 2017-06-10 ENCOUNTER — Other Ambulatory Visit: Payer: Self-pay

## 2017-06-10 ENCOUNTER — Encounter: Payer: Self-pay | Admitting: Internal Medicine

## 2017-06-10 ENCOUNTER — Ambulatory Visit (AMBULATORY_SURGERY_CENTER): Payer: PPO | Admitting: Internal Medicine

## 2017-06-10 VITALS — BP 118/60 | HR 53 | Temp 96.8°F | Resp 11 | Ht 70.0 in | Wt 178.0 lb

## 2017-06-10 DIAGNOSIS — K625 Hemorrhage of anus and rectum: Secondary | ICD-10-CM | POA: Diagnosis not present

## 2017-06-10 DIAGNOSIS — K219 Gastro-esophageal reflux disease without esophagitis: Secondary | ICD-10-CM | POA: Diagnosis not present

## 2017-06-10 DIAGNOSIS — Z8673 Personal history of transient ischemic attack (TIA), and cerebral infarction without residual deficits: Secondary | ICD-10-CM | POA: Diagnosis not present

## 2017-06-10 DIAGNOSIS — Z86010 Personal history of colonic polyps: Secondary | ICD-10-CM | POA: Diagnosis not present

## 2017-06-10 MED ORDER — SODIUM CHLORIDE 0.9 % IV SOLN: 500.0000 mL | INTRAVENOUS | Status: DC

## 2017-06-10 NOTE — Progress Notes (Signed)
Report to PACU, RN, vss, BBS= Clear.  

## 2017-06-10 NOTE — Op Note (Signed)
[place] [facility] Patient Name: Justin [name] Procedure Date: 06/10/2017 11:01 AM MRN: [id number] Endoscopist: Gatha Mayer , MD Age: 83 Referring MD:  Date of Birth: 1925-06-23 Gender: Male Account #: [id number] Procedure:                Colonoscopy Indications:              Rectal bleeding Medicines:                Propofol per Anesthesia, Monitored Anesthesia Care Procedure:                Pre-Anesthesia Assessment:                           - Prior to the procedure, a History and Physical                            was performed, and patient medications and                            allergies were reviewed. The patient's tolerance of                            previous anesthesia was also reviewed. The risks                            and benefits of the procedure and the sedation                            options and risks were discussed with the patient.                            All questions were answered, and informed consent                            was obtained. Prior Anticoagulants: The patient has                            taken no previous anticoagulant or antiplatelet                            agents. ASA Grade Assessment: III - A patient with                            severe systemic disease. After reviewing the risks                            and benefits, the patient was deemed in                            satisfactory condition to undergo the procedure.                           After obtaining informed consent, the colonoscope  was passed under direct vision. Throughout the                            procedure, the patient's blood pressure, pulse, and                            oxygen saturations were monitored continuously. The                            Colonoscope was introduced through the anus and                            advanced to the the cecum, identified by                            appendiceal orifice and  ileocecal valve. The                            colonoscopy was performed without difficulty. The                            patient tolerated the procedure well. The quality                            of the bowel preparation was good. The ileocecal                            valve, appendiceal orifice, and rectum were                            photographed. The bowel preparation used was                            Miralax. Scope In: 11:11:41 AM Scope Out: 11:27:21 AM Scope Withdrawal Time: 0 hours 8 minutes 6 seconds  Total Procedure Duration: 0 hours 15 minutes 40 seconds  Findings:                 The perianal and digital rectal examinations were                            normal.                           Multiple diverticula were found in the left colon.                           Internal hemorrhoids were found during retroflexion.                           The exam was otherwise without abnormality on                            direct and retroflexion views. Complications:            No immediate complications. Estimated Blood Loss:  Estimated blood loss: none. Impression:               - Diverticulosis in the left colon.                           - Internal hemorrhoids.                           - The examination was otherwise normal on direct                            and retroflexion views.                           - No specimens collected. Recommendation:           - Patient has a contact number available for                            emergencies. The signs and symptoms of potential                            delayed complications were discussed with the                            patient. Return to normal activities tomorrow.                            Written discharge instructions were provided to the                            patient.                           - Resume previous diet.                           - Continue present medications.                            - No repeat colonoscopy due to age. Gatha Mayer, MD 06/10/2017 11:31:30 AM This report has been signed electronically.

## 2017-06-10 NOTE — Progress Notes (Signed)
Pt. Reports no change in his medical or surgical history since his pre-visit on 04/10/2017.

## 2017-06-10 NOTE — Patient Instructions (Addendum)
   No polyps or cancer were seen. You have some internal hemorrhoids which are why you see rectal bleeding.  I appreciate the opportunity to care for you. Gatha Mayer, MD, [facility]   Handouts given: Diverticulosis and Hemorrhoids.   YOU HAD AN ENDOSCOPIC PROCEDURE TODAY AT [facility]:   Refer to the procedure report that was given to you for any specific questions about what was found during the examination.  If the procedure report does not answer your questions, please call your gastroenterologist to clarify.  If you requested that your care partner not be given the details of your procedure findings, then the procedure report has been included in a sealed envelope for you to review at your convenience later.  YOU SHOULD EXPECT: Some feelings of bloating in the abdomen. Passage of more gas than usual.  Walking can help get rid of the air that was put into your GI tract during the procedure and reduce the bloating. If you had a lower endoscopy (such as a colonoscopy or flexible sigmoidoscopy) you may notice spotting of blood in your stool or on the toilet paper. If you underwent a bowel prep for your procedure, you may not have a normal bowel movement for a few days.  Please Note:  You might notice some irritation and congestion in your nose or some drainage.  This is from the oxygen used during your procedure.  There is no need for concern and it should clear up in a day or so.  SYMPTOMS TO REPORT IMMEDIATELY:   Following lower endoscopy (colonoscopy or flexible sigmoidoscopy):  Excessive amounts of blood in the stool  Significant tenderness or worsening of abdominal pains  Swelling of the abdomen that is new, acute  Fever of 100F or higher  For urgent or emergent issues, a gastroenterologist can be reached at any hour by calling [phone].   DIET:  We do recommend a small meal at first, but then you may proceed to your regular diet.  Drink plenty of fluids  but you should avoid alcoholic beverages for 24 hours.  ACTIVITY:  You should plan to take it easy for the rest of today and you should NOT DRIVE or use heavy machinery until tomorrow (because of the sedation medicines used during the test).    FOLLOW UP: Our staff will call the number listed on your records the next business day following your procedure to check on you and address any questions or concerns that you may have regarding the information given to you following your procedure. If we do not reach you, we will leave a message.  However, if you are feeling well and you are not experiencing any problems, there is no need to return our call.  We will assume that you have returned to your regular daily activities without incident.  If any biopsies were taken you will be contacted by phone or by letter within the next 1-3 weeks.  Please call us at [phone] if you have not heard about the biopsies in 3 weeks.    SIGNATURES/CONFIDENTIALITY: You and/or your care partner have signed paperwork which will be entered into your electronic medical record.  These signatures attest to the fact that that the information above on your After Visit Summary has been reviewed and is understood.  Full responsibility of the confidentiality of this discharge information lies with you and/or your care-partner.

## 2017-06-11 ENCOUNTER — Telehealth: Payer: Self-pay | Admitting: *Deleted

## 2017-06-11 NOTE — Telephone Encounter (Signed)
  Follow up Call-  Call back number 06/10/2017  Post procedure Call Back phone  # [phone]  Permission to leave phone message Yes  Some recent data might be hidden     Patient questions:  Do you have a fever, pain , or abdominal swelling? No. Pain Score  0 *  Have you tolerated food without any problems? Yes.    Have you been able to return to your normal activities? Yes.    Do you have any questions about your discharge instructions: Diet   No. Medications  No. Follow up visit  No.  Do you have questions or concerns about your Care? No.  Actions: * If pain score is 4 or above: No action needed, pain <4.

## 2017-06-29 ENCOUNTER — Other Ambulatory Visit: Payer: Self-pay | Admitting: Family Medicine

## 2017-08-16 ENCOUNTER — Ambulatory Visit (INDEPENDENT_AMBULATORY_CARE_PROVIDER_SITE_OTHER): Payer: PPO | Admitting: *Deleted

## 2017-08-16 DIAGNOSIS — E538 Deficiency of other specified B group vitamins: Secondary | ICD-10-CM

## 2017-08-16 MED ORDER — CYANOCOBALAMIN 1000 MCG/ML IJ SOLN
1000.0000 ug | Freq: Once | INTRAMUSCULAR | Status: AC
  Administered 2017-08-16: 1000 ug via INTRAMUSCULAR

## 2017-09-12 DIAGNOSIS — L570 Actinic keratosis: Secondary | ICD-10-CM | POA: Diagnosis not present

## 2017-09-12 DIAGNOSIS — L821 Other seborrheic keratosis: Secondary | ICD-10-CM | POA: Diagnosis not present

## 2017-09-12 DIAGNOSIS — Z85828 Personal history of other malignant neoplasm of skin: Secondary | ICD-10-CM | POA: Diagnosis not present

## 2017-09-23 ENCOUNTER — Other Ambulatory Visit: Payer: Self-pay | Admitting: Family Medicine

## 2017-10-25 ENCOUNTER — Ambulatory Visit: Payer: PPO | Admitting: Family Medicine

## 2017-10-25 DIAGNOSIS — Z0289 Encounter for other administrative examinations: Secondary | ICD-10-CM

## 2017-10-28 DIAGNOSIS — L821 Other seborrheic keratosis: Secondary | ICD-10-CM | POA: Diagnosis not present

## 2017-10-28 DIAGNOSIS — L570 Actinic keratosis: Secondary | ICD-10-CM | POA: Diagnosis not present

## 2017-10-28 DIAGNOSIS — Z85828 Personal history of other malignant neoplasm of skin: Secondary | ICD-10-CM | POA: Diagnosis not present

## 2017-11-18 ENCOUNTER — Telehealth: Payer: Self-pay | Admitting: Family Medicine

## 2017-11-18 NOTE — Telephone Encounter (Signed)
Copy of bill given to Justin [name]. Pt would like to be called at home phone to inform about the bill.

## 2017-11-18 NOTE — Telephone Encounter (Signed)
Copied from [id number]. Topic: Quick Communication - See Telephone Encounter >> Nov 18, 2017  2:19 PM Rosalin Hawking wrote: CRM for notification. See Telephone encounter for: 11/18/17.   Pt came in office stating received a bill for a no show fee, pt states that he had his family member call our office ahead of time from his appt to be canceled, pt would like someone to look at it and to get it fixed.

## 2017-11-27 DIAGNOSIS — Z87442 Personal history of urinary calculi: Secondary | ICD-10-CM | POA: Diagnosis not present

## 2017-11-27 DIAGNOSIS — N401 Enlarged prostate with lower urinary tract symptoms: Secondary | ICD-10-CM | POA: Diagnosis not present

## 2017-11-27 DIAGNOSIS — N200 Calculus of kidney: Secondary | ICD-10-CM | POA: Diagnosis not present

## 2017-11-27 DIAGNOSIS — N400 Enlarged prostate without lower urinary tract symptoms: Secondary | ICD-10-CM | POA: Diagnosis not present

## 2017-11-30 DIAGNOSIS — N200 Calculus of kidney: Secondary | ICD-10-CM | POA: Diagnosis not present

## 2017-12-09 ENCOUNTER — Ambulatory Visit: Payer: PPO | Admitting: Family Medicine

## 2017-12-09 DIAGNOSIS — N270 Small kidney, unilateral: Secondary | ICD-10-CM | POA: Diagnosis not present

## 2018-01-06 ENCOUNTER — Encounter: Payer: Self-pay | Admitting: Family Medicine

## 2018-01-06 ENCOUNTER — Ambulatory Visit (INDEPENDENT_AMBULATORY_CARE_PROVIDER_SITE_OTHER): Payer: PPO | Admitting: Family Medicine

## 2018-01-06 VITALS — BP 139/60 | HR 70 | Temp 98.1°F | Resp 16 | Ht 70.0 in | Wt 181.8 lb

## 2018-01-06 DIAGNOSIS — E538 Deficiency of other specified B group vitamins: Secondary | ICD-10-CM | POA: Diagnosis not present

## 2018-01-06 DIAGNOSIS — E039 Hypothyroidism, unspecified: Secondary | ICD-10-CM

## 2018-01-06 DIAGNOSIS — E785 Hyperlipidemia, unspecified: Secondary | ICD-10-CM | POA: Diagnosis not present

## 2018-01-06 LAB — COMPREHENSIVE METABOLIC PANEL
ALT: 11 U/L (ref 0–53)
AST: 15 U/L (ref 0–37)
Albumin: 3.6 g/dL (ref 3.5–5.2)
Alkaline Phosphatase: 70 U/L (ref 39–117)
BUN: 19 mg/dL (ref 6–23)
CO2: 29 mEq/L (ref 19–32)
Calcium: 9 mg/dL (ref 8.4–10.5)
Chloride: 104 mEq/L (ref 96–112)
Creatinine, Ser: 0.86 mg/dL (ref 0.40–1.50)
GFR: 88.68 mL/min (ref >60.00)
Glucose, Bld: 91 mg/dL (ref 70–99)
Potassium: 3.9 mEq/L (ref 3.5–5.1)
Sodium: 140 mEq/L (ref 135–145)
Total Bilirubin: 0.5 mg/dL (ref 0.2–1.2)
Total Protein: 7.6 g/dL (ref 6.0–8.3)

## 2018-01-06 LAB — LIPID PANEL
Cholesterol: 117 mg/dL (ref 0–200)
HDL: 46.9 mg/dL (ref >39.00)
LDL Cholesterol: 48 mg/dL (ref 0–99)
NonHDL: 70.44
Total CHOL/HDL Ratio: 3
Triglycerides: 112 mg/dL (ref 0.0–149.0)
VLDL: 22.4 mg/dL (ref 0.0–40.0)

## 2018-01-06 LAB — B12 AND FOLATE PANEL
Folate: 15.8 ng/mL (ref >5.9)
Vitamin B-12: 207 pg/mL — ABNORMAL LOW (ref 211–911)

## 2018-01-06 LAB — TSH: TSH: 4.09 u[IU]/mL (ref 0.35–4.50)

## 2018-01-06 MED ORDER — CYANOCOBALAMIN 1000 MCG/ML IJ SOLN
1000.0000 ug | Freq: Once | INTRAMUSCULAR | Status: AC
  Administered 2018-01-06: 1000 ug via INTRAMUSCULAR

## 2018-01-06 MED ORDER — SIMVASTATIN 20 MG PO TABS
ORAL_TABLET | ORAL | 1 refills | Status: DC
Start: 2018-01-06 — End: 2018-07-28

## 2018-01-06 NOTE — Patient Instructions (Signed)

## 2018-01-06 NOTE — Assessment & Plan Note (Signed)
Check labs con't meds 

## 2018-01-06 NOTE — Assessment & Plan Note (Signed)
Check labs 

## 2018-01-06 NOTE — Progress Notes (Signed)
Patient ID: Justin [name], male    DOB: 1924/11/20  Age: 84 y.o. MRN: [id number]    Subjective:  Subjective  HPI [name] presents for f/u cholesterol and bp.  No complaints.     Review of Systems  Constitutional: Negative for chills and fever.  HENT: Negative for congestion and hearing loss.   Eyes: Negative for discharge.  Respiratory: Negative for cough and shortness of breath.   Cardiovascular: Negative for chest pain, palpitations and leg swelling.  Gastrointestinal: Negative for abdominal pain, blood in stool, constipation, diarrhea, nausea and vomiting.  Genitourinary: Negative for dysuria, frequency, hematuria and urgency.  Musculoskeletal: Negative for back pain and myalgias.  Skin: Negative for rash.  Allergic/Immunologic: Negative for environmental allergies.  Neurological: Negative for dizziness, weakness and headaches.  Hematological: Does not bruise/bleed easily.  Psychiatric/Behavioral: Negative for suicidal ideas. The patient is not nervous/anxious.     History Past Medical History:  Diagnosis Date  . Anemia   . Basal cell cancer    Dr Wilhemina Bonito  . Benign prostatic hypertrophy    Dr Amalia Hailey  . Carpal tunnel syndrome 10/19/2015   Right  . Chronic kidney disease   . Colon polyp   . Diverticulosis   . Hyperlipidemia   . Kidney stones   . Shingles   . Stroke ([place])   . Transient ischemic attack    Dr Leonie Man    He has a past surgical history that includes Lithotripsy; Hemorrhoid surgery; Colonoscopy w/ polypectomy (01/2009); Cataract extraction, bilateral; and Prostate biopsy.   His family history includes Colon cancer in his mother; Colon polyps in his sister; Coronary artery disease in his brother; Diabetes in his brother and mother; Heart attack in his brother and unknown relative; Heart attack (age of onset: 22) in his father; Heart failure in his father; Other in his sister; Prostate cancer in his brother; Stroke in his brother and brother.He reports  that he quit smoking about 41 years ago. His smoking use included cigarettes, pipe, and cigars. He has quit using smokeless tobacco. His smokeless tobacco use included chew. He reports that he does not drink alcohol or use drugs.  Current Outpatient Medications on File Prior to Visit  Medication Sig Dispense Refill  . aspirin 81 MG tablet Take 81 mg by mouth daily.    . finasteride (PROSCAR) 5 MG tablet TAKE 1 TABLET BY MOUTH EVERY DAY 90 tablet 1   No current facility-administered medications on file prior to visit.      Objective:  Objective  Physical Exam  Constitutional: He is oriented to person, place, and time. Vital signs are normal. He appears well-developed and well-nourished. He is sleeping.  HENT:  Head: Normocephalic and atraumatic.  Mouth/Throat: Oropharynx is clear and moist.  Eyes: Pupils are equal, round, and reactive to light. EOM are normal.  Neck: Normal range of motion. Neck supple. No thyromegaly present.  Cardiovascular: Normal rate and regular rhythm.  No murmur heard. Pulmonary/Chest: Effort normal and breath sounds normal. No respiratory distress. He has no wheezes. He has no rales. He exhibits no tenderness.  Musculoskeletal: He exhibits no edema or tenderness.  Neurological: He is alert and oriented to person, place, and time.  Skin: Skin is warm and dry.  Psychiatric: He has a normal mood and affect. His behavior is normal. Judgment and thought content normal.  Nursing note and vitals reviewed.  BP 139/60   Pulse 70   Temp 98.1 F (36.7 C) (Oral)   Resp 16  Ht 5\' 10"  (1.778 m)   Wt 181 lb 12.8 oz (82.5 kg)   SpO2 99%   BMI 26.09 kg/m  Wt Readings from Last 3 Encounters:  01/06/18 181 lb 12.8 oz (82.5 kg)  06/10/17 178 lb (80.7 kg)  06/04/17 180 lb 3.2 oz (81.7 kg)     Lab Results  Component Value Date   WBC 5.3 04/26/2017   HGB 11.9 (L) 04/26/2017   HCT 34.3 (L) 04/26/2017   PLT 196.0 04/26/2017   GLUCOSE 81 04/26/2017   CHOL 129  10/26/2016   TRIG 94 10/26/2016   HDL 51 10/26/2016   LDLCALC 59 10/26/2016   ALT 10 04/26/2017   AST 14 04/26/2017   NA 139 04/26/2017   K 3.5 04/26/2017   CL 103 04/26/2017   CREATININE 0.83 04/26/2017   BUN 19 04/26/2017   CO2 31 04/26/2017   TSH 5.17 (H) 10/30/2016   HGBA1C 5.4 01/10/2016   MICROALBUR 1.3 01/10/2016    Ct Abdomen Pelvis W Contrast  Result Date: 03/15/2017 CLINICAL DATA:  Right upper quadrant abdominal pain EXAM: CT ABDOMEN AND PELVIS WITH CONTRAST TECHNIQUE: Multidetector CT imaging of the abdomen and pelvis was performed using the standard protocol following bolus administration of intravenous contrast. CONTRAST:  100 mL Isovue 300 COMPARISON:  04/23/2016 FINDINGS: Lower chest: Mild subpleural reticulation/ fibrosis the lung bases. Hepatobiliary: Liver is within normal limits. Gallbladder underdistended. Pancreas: Within normal limits. Spleen: Within normal limits. Adrenals/Urinary Tract: Adrenal glands within normal limits. Left kidney is within normal limits. Low-lying right pelvic kidney with two nonobstructing right renal calculi measuring up to 9 mm in the right lower pole. No hydronephrosis. Bladder is within normal limits. Stomach/Bowel: Stomach is within normal limits. No evidence of bowel obstruction. Normal appendix (series 2/image 54). Left colonic diverticulosis, without evidence of diverticulitis. Vascular/Lymphatic: No evidence of abdominal aortic aneurysm. Atherosclerotic calcifications of the abdominal aorta and branch vessels. No suspicious abdominopelvic lymphadenopathy. Reproductive: Prostatomegaly with heterogeneous enhancement. Other: No abdominopelvic ascites. Small fat containing left inguinal hernia. Musculoskeletal: Mild to moderate degenerative changes of the lumbar spine. IMPRESSION: Stable right pelvic kidney with two nonobstructing calculi measuring up to 9 mm in the lower pole. No ureteral or bladder calculi. No hydronephrosis. Colonic  diverticulosis, without evidence of diverticulitis. No evidence of bowel obstruction.  Normal appendix. Additional stable ancillary findings as above. Electronically Signed   By: Julian Hy M.D.   On: 03/15/2017 16:25     Assessment & Plan:  Plan  I have discontinued Joshawa Drier's ranitidine and loratadine. I am also having him maintain his aspirin, finasteride, and simvastatin. We will stop administering cyanocobalamin and cyanocobalamin. Additionally, we administered cyanocobalamin.  Meds ordered this encounter  Medications  . simvastatin (ZOCOR) 20 MG tablet    Sig: TAKE 1 TABLET BY MOUTH EVERYDAY AT BEDTIME    Dispense:  90 tablet    Refill:  1  . cyanocobalamin ((VITAMIN B-12)) injection 1,000 mcg    Problem List Items Addressed This Visit      Unprioritized   B12 deficiency    Check labs       Relevant Medications   cyanocobalamin ((VITAMIN B-12)) injection 1,000 mcg (Completed)   Other Relevant Orders   B12 and Folate Panel   Hyperlipidemia LDL goal <100 - Primary   Relevant Medications   simvastatin (ZOCOR) 20 MG tablet   Other Relevant Orders   Comprehensive metabolic panel   Lipid panel   Hypothyroidism    Check labs con't meds  Relevant Orders   TSH      Follow-up: Return in about 6 months (around 07/08/2018) for annual exam, fasting.  [name], DO

## 2018-03-21 ENCOUNTER — Ambulatory Visit: Payer: PPO | Admitting: Family Medicine

## 2018-04-24 DIAGNOSIS — L570 Actinic keratosis: Secondary | ICD-10-CM | POA: Diagnosis not present

## 2018-04-24 DIAGNOSIS — L820 Inflamed seborrheic keratosis: Secondary | ICD-10-CM | POA: Diagnosis not present

## 2018-05-06 ENCOUNTER — Telehealth: Payer: Self-pay | Admitting: *Deleted

## 2018-05-06 NOTE — Telephone Encounter (Signed)
Received Dermatopathology Report results from [facility]; forwarded to provider SLS 10/15

## 2018-05-12 DIAGNOSIS — H10502 Unspecified blepharoconjunctivitis, left eye: Secondary | ICD-10-CM | POA: Diagnosis not present

## 2018-05-28 ENCOUNTER — Ambulatory Visit (INDEPENDENT_AMBULATORY_CARE_PROVIDER_SITE_OTHER): Payer: PPO

## 2018-05-28 DIAGNOSIS — Z23 Encounter for immunization: Secondary | ICD-10-CM | POA: Diagnosis not present

## 2018-05-29 ENCOUNTER — Telehealth: Payer: Self-pay

## 2018-05-29 ENCOUNTER — Ambulatory Visit: Payer: PPO

## 2018-05-29 ENCOUNTER — Ambulatory Visit (INDEPENDENT_AMBULATORY_CARE_PROVIDER_SITE_OTHER): Payer: PPO

## 2018-05-29 ENCOUNTER — Other Ambulatory Visit: Payer: Self-pay | Admitting: *Deleted

## 2018-05-29 DIAGNOSIS — E538 Deficiency of other specified B group vitamins: Secondary | ICD-10-CM

## 2018-05-29 MED ORDER — CYANOCOBALAMIN 1000 MCG/ML IJ SOLN
1000.0000 ug | Freq: Once | INTRAMUSCULAR | Status: AC
Start: 2018-05-29 — End: 2018-05-29
  Administered 2018-05-29: 1000 ug via INTRAMUSCULAR

## 2018-05-29 MED ORDER — CYANOCOBALAMIN 1000 MCG/ML IJ SOLN: 1000.0000 ug | Freq: Once | INTRAMUSCULAR | Status: DC

## 2018-05-29 NOTE — Telephone Encounter (Signed)
Dr Etter Sjogren has given order for b12 injection to be done at [place] office today---patient has been advised that he needs to come monthly for injections in order to keep his b12 level normal---I have educated patient on what symptoms can present with low b12 and the importance of consistently coming once monthly for injections---patient really needs to schedule at the [place] office for his convenience, he lives just down the street from that office and can avoid having to travel all the way to [place] office from [place] where he lives---this should be more convenient for him---we have advised dr lowne's assistant so that she can be aware of what was discussed today---can talk with Justin Bowdish,RN at [place] office if any further questions

## 2018-06-27 ENCOUNTER — Ambulatory Visit (INDEPENDENT_AMBULATORY_CARE_PROVIDER_SITE_OTHER): Payer: PPO

## 2018-06-27 DIAGNOSIS — E538 Deficiency of other specified B group vitamins: Secondary | ICD-10-CM

## 2018-06-27 MED ORDER — CYANOCOBALAMIN 1000 MCG/ML IJ SOLN
1000.0000 ug | Freq: Once | INTRAMUSCULAR | Status: AC
  Administered 2018-06-27: 1000 ug via INTRAMUSCULAR

## 2018-06-27 NOTE — Progress Notes (Signed)
Pre visit review using our clinic tool,if applicable. No additional management support is needed unless otherwise documented below in the visit note.   Pt here for monthly B12 injection per order from Dr. Carollee Herter  B12 1000 mcg given IM left deltoid, and pt tolerated injection well. No complaints voiced his visit.j  Next B12 injection scheduled for 1 Appointment with Dr. Etter Sjogren in 1 month and patient will get B12 injection at that time.Marland Kitchen

## 2018-07-25 NOTE — Progress Notes (Signed)
Subjective:   Justin [name] is a 85 y.o. male who presents for Medicare Annual/Subsequent preventive examination.  Review of Systems: No ROS.  Medicare Wellness Visit. Additional risk factors are reflected in the social history. Cardiac Risk Factors include: advanced age (>24men, >60 women);dyslipidemia;male gender Sleep patterns: No issues. Naps daily in chair.  Home Safety/Smoke Alarms: Feels safe in home. Smoke alarms in place.  Living environment; residence and Adult nurse: lives in 2 story home. No issues with stairs.   Male:       PSA- No results found for: PSA      Objective:    Vitals: BP 138/76 (BP Location: Left Arm, Patient Position: Sitting, Cuff Size: Normal)   Pulse 62   Ht 5\' 10"  (1.778 m)   Wt 186 lb 3.2 oz (84.5 kg)   SpO2 97%   BMI 26.72 kg/m   Body mass index is 26.72 kg/m.  Advanced Directives 07/28/2018 06/15/2016 06/08/2016  Does Patient Have a Medical Advance Directive? No No No  Would patient like information on creating a medical advance directive? No - Patient declined - Yes - Scientist, clinical (histocompatibility and immunogenetics) given    Tobacco Social History   Tobacco Use  Smoking Status Former Smoker  . Types: Cigarettes, Pipe, Cigars  . Last attempt to quit: 07/23/1976  . Years since quitting: 42.0  Smokeless Tobacco Former Systems developer  . Types: Chew  Tobacco Comment   smoked 1943-1978, up to < 1 ppd     Counseling given: Not Answered Comment: smoked 1943-1978, up to < 1 ppd   Clinical Intake:     Pain : No/denies pain                 Past Medical History:  Diagnosis Date  . Anemia   . Basal cell cancer    Dr Wilhemina Bonito  . Benign prostatic hypertrophy    Dr Amalia Hailey  . Carpal tunnel syndrome 10/19/2015   Right  . Chronic kidney disease   . Colon polyp   . Diverticulosis   . Hyperlipidemia   . Kidney stones   . Shingles   . Stroke ([place])   . Transient ischemic attack    Dr Leonie Man   Past Surgical History:  Procedure Laterality Date  . CATARACT  EXTRACTION, BILATERAL    . COLONOSCOPY W/ POLYPECTOMY  01/2009   adenomatous, Dr. Alben Spittle  . HEMORRHOID SURGERY    . LITHOTRIPSY      X 2;DrAmalia Hailey  . PROSTATE BIOPSY     X2   Family History  Problem Relation Age of Onset  . Heart failure Father   . Heart attack Father 59  . Diabetes Mother   . Colon cancer Mother   . Prostate cancer Brother   . Stroke Brother   . Coronary artery disease Brother        7 vessel CBAG  . Diabetes Brother   . Heart attack Other        2 P uncles , both > 55  . Stroke Brother        in late 2s  . Colon polyps Sister   . Heart attack Brother   . Other Sister        brain hemorrhage due to a fall   Social History   Socioeconomic History  . Marital status: Married    Spouse name: Not on file  . Number of children: 4  . Years of education: Not on file  . Highest education level:  Not on file  Occupational History  . Occupation: Truck Education administrator: [employer]  . Financial resource strain: Not on file  . Food insecurity:    Worry: Not on file    Inability: Not on file  . Transportation needs:    Medical: Not on file    Non-medical: Not on file  Tobacco Use  . Smoking status: Former Smoker    Types: Cigarettes, Pipe, Cigars    Last attempt to quit: 07/23/1976    Years since quitting: 42.0  . Smokeless tobacco: Former Systems developer    Types: Chew  . Tobacco comment: smoked 1943-1978, up to < 1 ppd  Substance and Sexual Activity  . Alcohol use: No    Alcohol/week: 1.0 standard drinks    Types: 1 Glasses of wine per week    Comment: rare wine  . Drug use: No  . Sexual activity: Not on file  Lifestyle  . Physical activity:    Days per week: Not on file    Minutes per session: Not on file  . Stress: Not on file  Relationships  . Social connections:    Talks on phone: Not on file    Gets together: Not on file    Attends religious service: Not on file    Active member of club or organization: Not on file     Attends meetings of clubs or organizations: Not on file    Relationship status: Not on file  Other Topics Concern  . 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 aspirin 81 MG tablet Take 81 mg by mouth daily.  . finasteride (PROSCAR) 5 MG tablet TAKE 1 TABLET BY MOUTH EVERY DAY  . simvastatin (ZOCOR) 20 MG tablet TAKE 1 TABLET BY MOUTH EVERYDAY AT BEDTIME   Facility-Administered Encounter Medications as of 07/28/2018  Medication  . cyanocobalamin ((VITAMIN B-12)) injection 1,000 mcg    Activities of Daily Living In your present state of health, do you have any difficulty performing the following activities: 07/28/2018  Hearing? N  Comment DOT hearing test yearly.  Vision? N  Comment wearing glasses. Yearly DOT eye exam.  Difficulty concentrating or making decisions? N  Walking or climbing stairs? N  Dressing or bathing? N  Doing errands, shopping? N  Preparing Food and eating ? N  Using the Toilet? N  In the past six months, have you accidently leaked urine? N  Do you have problems with loss of bowel control? N  Managing your Medications? N  Managing your Finances? N  Housekeeping or managing your Housekeeping? N  Some recent data might be hidden    Patient Care Team: Carollee Herter, Alferd Apa, DO as PCP - General (Family Medicine) Domingo Pulse, MD (Urology) Rutherford Guys, MD as Consulting Physician (Ophthalmology) Danella Sensing, MD as Consulting Physician (Dermatology)   Assessment:   This is a routine wellness examination for Justin [name]. Physical assessment deferred to PCP.  Exercise Activities and Dietary recommendations Current Exercise Habits: The patient does not participate in regular exercise at present, Exercise limited by: None identified   Diet (meal preparation, eat out, water intake,  caffeinated beverages, dairy products, fruits and vegetables): well balanced  Pt reports he needs to drink more water.  Goals    . DIET - INCREASE WATER INTAKE       Fall Risk Fall Risk  07/28/2018 10/26/2016 12/09/2015 08/22/2015 06/12/2013  Falls in the past year? 0 Yes Yes No No  Number falls in past yr: - 1 1 - -  Injury with Fall? - Yes Yes - -  Comment - pt report he tripped over a curb at [employer], pt report he injuried by knees - - -  Risk Factor Category  - - High Fall Risk - -  Risk for fall due to : - - Impaired balance/gait - -  Follow up - - Falls prevention discussed - -    Depression Screen PHQ 2/9 Scores 07/28/2018 10/26/2016 12/09/2015 02/28/2015  PHQ - 2 Score 0 0 0 0  Exception Documentation - - Patient refusal -    Cognitive Function MMSE - Mini Mental State Exam 07/28/2018  Orientation to time 5  Orientation to Place 5  Registration 3  Attention/ Calculation 5  Recall 2  Language- name 2 objects 2  Language- repeat 1  Language- follow 3 step command 3  Language- read & follow direction 1  Write a sentence 1  Copy design 1  Total score 29        Immunization History  Administered Date(s) Administered  . Influenza Split 05/25/2011, 05/23/2012  . Influenza Whole 05/27/2007, 05/26/2008, 06/13/2009, 05/23/2010  . Influenza, High Dose Seasonal PF 05/25/2015, 06/08/2016, 04/26/2017, 05/28/2018  . Influenza, Seasonal, Injecte, Preservative Fre 05/25/2013  . Influenza,inj,Quad PF,6+ Mos 05/10/2014  . Td 08/13/2002  . Tdap 12/03/2012    Screening Tests Health Maintenance  Topic Date Due  . PNA vac Low Risk Adult (1 of 2 - PCV13) 06/13/1992  . TETANUS/TDAP  12/04/2022  . INFLUENZA VACCINE  Completed        Plan:    Please schedule your next medicare wellness visit with me in 1 yr.  Continue to eat heart healthy diet (full of fruits, vegetables, whole grains, lean protein, water--limit salt, fat, and sugar intake) and increase physical activity as  tolerated.  Continue doing brain stimulating activities (puzzles, reading, adult coloring books, staying active) to keep memory sharp.     I have personally reviewed and noted the following in the patient's chart:   . Medical and social history . Use of alcohol, tobacco or illicit drugs  . Current medications and supplements . Functional ability and status . Nutritional status . Physical activity . Advanced directives . List of other physicians . Hospitalizations, surgeries, and ER visits in previous 12 months . Vitals . Screenings to include cognitive, depression, and falls . Referrals and appointments  In addition, I have reviewed and discussed with patient certain preventive protocols, quality metrics, and best practice recommendations. A written personalized care plan for preventive services as well as general preventive health recommendations were provided to patient.     Shela Nevin, [place]  07/28/2018

## 2018-07-28 ENCOUNTER — Encounter: Payer: Self-pay | Admitting: Family Medicine

## 2018-07-28 ENCOUNTER — Encounter: Payer: Self-pay | Admitting: *Deleted

## 2018-07-28 ENCOUNTER — Ambulatory Visit (INDEPENDENT_AMBULATORY_CARE_PROVIDER_SITE_OTHER): Payer: PPO | Admitting: *Deleted

## 2018-07-28 ENCOUNTER — Encounter: Payer: PPO | Admitting: Family Medicine

## 2018-07-28 ENCOUNTER — Ambulatory Visit (INDEPENDENT_AMBULATORY_CARE_PROVIDER_SITE_OTHER): Payer: PPO | Admitting: Family Medicine

## 2018-07-28 VITALS — BP 138/76 | HR 62 | Ht 70.0 in | Wt 186.2 lb

## 2018-07-28 VITALS — BP 138/76 | HR 62 | Temp 97.5°F | Resp 16 | Ht 70.0 in | Wt 186.0 lb

## 2018-07-28 DIAGNOSIS — E785 Hyperlipidemia, unspecified: Secondary | ICD-10-CM | POA: Diagnosis not present

## 2018-07-28 DIAGNOSIS — E538 Deficiency of other specified B group vitamins: Secondary | ICD-10-CM | POA: Diagnosis not present

## 2018-07-28 DIAGNOSIS — D229 Melanocytic nevi, unspecified: Secondary | ICD-10-CM

## 2018-07-28 DIAGNOSIS — N401 Enlarged prostate with lower urinary tract symptoms: Secondary | ICD-10-CM | POA: Diagnosis not present

## 2018-07-28 DIAGNOSIS — R350 Frequency of micturition: Secondary | ICD-10-CM

## 2018-07-28 DIAGNOSIS — E039 Hypothyroidism, unspecified: Secondary | ICD-10-CM

## 2018-07-28 DIAGNOSIS — Z0000 Encounter for general adult medical examination without abnormal findings: Secondary | ICD-10-CM

## 2018-07-28 DIAGNOSIS — Z8673 Personal history of transient ischemic attack (TIA), and cerebral infarction without residual deficits: Secondary | ICD-10-CM | POA: Diagnosis not present

## 2018-07-28 DIAGNOSIS — N138 Other obstructive and reflux uropathy: Secondary | ICD-10-CM

## 2018-07-28 LAB — POC URINALSYSI DIPSTICK (AUTOMATED)
Bilirubin, UA: NEGATIVE
Blood, UA: NEGATIVE
Glucose, UA: NEGATIVE
Ketones, UA: NEGATIVE
Leukocytes, UA: NEGATIVE
Nitrite, UA: NEGATIVE
Protein, UA: NEGATIVE
Spec Grav, UA: 1.01 (ref 1.010–1.025)
Urobilinogen, UA: 0.2 E.U./dL
pH, UA: 6 (ref 5.0–8.0)

## 2018-07-28 LAB — COMPREHENSIVE METABOLIC PANEL
ALT: 11 U/L (ref 0–53)
AST: 15 U/L (ref 0–37)
Albumin: 3.7 g/dL (ref 3.5–5.2)
Alkaline Phosphatase: 62 U/L (ref 39–117)
BUN: 18 mg/dL (ref 6–23)
CO2: 30 mEq/L (ref 19–32)
Calcium: 8.7 mg/dL (ref 8.4–10.5)
Chloride: 104 mEq/L (ref 96–112)
Creatinine, Ser: 0.85 mg/dL (ref 0.40–1.50)
GFR: 89.78 mL/min (ref >60.00)
Glucose, Bld: 79 mg/dL (ref 70–99)
Potassium: 3.7 mEq/L (ref 3.5–5.1)
Sodium: 139 mEq/L (ref 135–145)
Total Bilirubin: 0.5 mg/dL (ref 0.2–1.2)
Total Protein: 7.4 g/dL (ref 6.0–8.3)

## 2018-07-28 LAB — CBC WITH DIFFERENTIAL/PLATELET
Basophils Absolute: 0 10*3/uL (ref 0.0–0.1)
Basophils Relative: 0.6 % (ref 0.0–3.0)
Eosinophils Absolute: 0.1 10*3/uL (ref 0.0–0.7)
Eosinophils Relative: 1.6 % (ref 0.0–5.0)
HCT: 34.4 % — ABNORMAL LOW (ref 39.0–52.0)
Hemoglobin: 12 g/dL — ABNORMAL LOW (ref 13.0–17.0)
Lymphocytes Relative: 15.1 % (ref 12.0–46.0)
Lymphs Abs: 1.1 10*3/uL (ref 0.7–4.0)
MCHC: 35 g/dL (ref 30.0–36.0)
MCV: 98.9 fl (ref 78.0–100.0)
Monocytes Absolute: 0.7 10*3/uL (ref 0.1–1.0)
Monocytes Relative: 10.2 % (ref 3.0–12.0)
Neutro Abs: 5.3 10*3/uL (ref 1.4–7.7)
Neutrophils Relative %: 72.5 % (ref 43.0–77.0)
Platelets: 196 10*3/uL (ref 150.0–400.0)
RBC: 3.47 Mil/uL — ABNORMAL LOW (ref 4.22–5.81)
RDW: 13.4 % (ref 11.5–15.5)
WBC: 7.3 10*3/uL (ref 4.0–10.5)

## 2018-07-28 LAB — LIPID PANEL
Cholesterol: 118 mg/dL (ref 0–200)
HDL: 45.4 mg/dL (ref >39.00)
LDL Cholesterol: 50 mg/dL (ref 0–99)
NonHDL: 72.23
Total CHOL/HDL Ratio: 3
Triglycerides: 113 mg/dL (ref 0.0–149.0)
VLDL: 22.6 mg/dL (ref 0.0–40.0)

## 2018-07-28 LAB — VITAMIN B12: Vitamin B-12: >1500 pg/mL — ABNORMAL HIGH (ref 211–911)

## 2018-07-28 LAB — PSA: PSA: 1.32 ng/mL (ref 0.10–4.00)

## 2018-07-28 LAB — TSH: TSH: 4.55 u[IU]/mL — ABNORMAL HIGH (ref 0.35–4.50)

## 2018-07-28 MED ORDER — FINASTERIDE 5 MG PO TABS: 5.0000 mg | ORAL_TABLET | Freq: Every day | ORAL | 1 refills | Status: DC

## 2018-07-28 MED ORDER — CYANOCOBALAMIN 1000 MCG/ML IJ SOLN
1000.0000 ug | Freq: Once | INTRAMUSCULAR | Status: AC
  Administered 2018-07-28: 1000 ug via INTRAMUSCULAR

## 2018-07-28 MED ORDER — SIMVASTATIN 20 MG PO TABS: ORAL_TABLET | ORAL | 1 refills | Status: DC

## 2018-07-28 NOTE — Progress Notes (Signed)
Patient ID: Justin [name], male    DOB: [birth year]  Age: 85 y.o. MRN: [id number]    Subjective:  Subjective  HPI Esdras Delair presents for cpe and labs.  Pt is still driving 2 days a week.   He does shorter distances.    Review of Systems  Constitutional: Negative.  Negative for chills and fever.  HENT: Negative for congestion, ear pain, hearing loss, nosebleeds, postnasal drip, rhinorrhea, sinus pressure, sneezing and tinnitus.   Eyes: Negative for photophobia, discharge, itching and visual disturbance.  Respiratory: Negative.  Negative for cough and shortness of breath.   Cardiovascular: Negative.  Negative for chest pain, palpitations and leg swelling.  Gastrointestinal: Negative for abdominal distention, abdominal pain, anal bleeding, blood in stool, constipation, diarrhea, nausea and vomiting.  Endocrine: Negative.   Genitourinary: Negative.  Negative for dysuria, frequency, hematuria and urgency.  Musculoskeletal: Negative.  Negative for back pain and myalgias.  Skin: Negative.  Negative for rash.  Allergic/Immunologic: Negative.  Negative for environmental allergies.  Neurological: Negative for dizziness, weakness, light-headedness, numbness and headaches.  Hematological: Does not bruise/bleed easily.  Psychiatric/Behavioral: Negative for agitation, confusion, decreased concentration, dysphoric mood, sleep disturbance and suicidal ideas. The patient is not nervous/anxious.     History Past Medical History:  Diagnosis Date  . Anemia   . Basal cell cancer    Dr Wilhemina Bonito  . Benign prostatic hypertrophy    Dr Amalia Hailey  . Carpal tunnel syndrome 10/19/2015   Right  . Chronic kidney disease   . Colon polyp   . Diverticulosis   . Hyperlipidemia   . Kidney stones   . Shingles   . Stroke ([place])   . Transient ischemic attack    Dr Leonie Man    He has a past surgical history that includes Lithotripsy; Hemorrhoid surgery; Colonoscopy w/ polypectomy (01/2009); Cataract extraction,  bilateral; and Prostate biopsy.   His family history includes Colon cancer in his mother; Colon polyps in his sister; Coronary artery disease in his brother; Diabetes in his brother and mother; Heart attack in his brother and another family member; Heart attack (age of onset: 2) in his father; Heart failure in his father; Other in his sister; Prostate cancer in his brother; Stroke in his brother and brother.He reports that he quit smoking about 42 years ago. His smoking use included cigarettes, pipe, and cigars. He has quit using smokeless tobacco.  His smokeless tobacco use included chew. He reports that he does not drink alcohol or use drugs.  Current Outpatient Medications on File Prior to Visit  Medication Sig Dispense Refill  . aspirin 81 MG tablet Take 81 mg by mouth daily.     Current Facility-Administered Medications on File Prior to Visit  Medication Dose Route Frequency Provider Last Rate Last Dose  . cyanocobalamin ((VITAMIN B-12)) injection 1,000 mcg  1,000 mcg Intramuscular Once Carollee Herter, Kendrick Fries R, DO         Objective:  Objective  Physical Exam Vitals signs and nursing note reviewed.  Constitutional:      General: He is not in acute distress.    Appearance: He is well-developed. He is not diaphoretic.  HENT:     Head: Normocephalic and atraumatic.     Right Ear: External ear normal.     Left Ear: External ear normal.     Nose: Nose normal.     Mouth/Throat:     Pharynx: No oropharyngeal exudate.  Eyes:     General:  Right eye: No discharge.        Left eye: No discharge.     Conjunctiva/sclera: Conjunctivae normal.     Pupils: Pupils are equal, round, and reactive to light.  Neck:     Musculoskeletal: Normal range of motion and neck supple.     Thyroid: No thyromegaly.     Vascular: No JVD.  Cardiovascular:     Rate and Rhythm: Normal rate and regular rhythm.     Heart sounds: No murmur.  Pulmonary:     Effort: Pulmonary effort is normal. No  respiratory distress.     Breath sounds: Normal breath sounds. No wheezing or rales.  Chest:     Chest wall: No tenderness.  Abdominal:     General: Bowel sounds are normal. There is no distension.     Palpations: Abdomen is soft. There is no mass.     Tenderness: There is no abdominal tenderness. There is no guarding or rebound.  Genitourinary:    Comments: urology Musculoskeletal: Normal range of motion.        General: No tenderness.  Lymphadenopathy:     Cervical: No cervical adenopathy.  Skin:    General: Skin is warm and dry.     Findings: Lesion present. No erythema or rash.       Neurological:     Mental Status: He is alert and oriented to person, place, and time.     Cranial Nerves: No cranial nerve deficit.     Motor: No abnormal muscle tone.     Deep Tendon Reflexes: Reflexes are normal and symmetric. Reflexes normal.  Psychiatric:        Behavior: Behavior normal.        Thought Content: Thought content normal.        Judgment: Judgment normal.    BP 138/76   Pulse 62   Temp (!) 97.5 F (36.4 C) (Oral)   Resp 16   Ht 5\' 10"  (1.778 m)   Wt 186 lb (84.4 kg)   SpO2 97%   BMI 26.69 kg/m  Wt Readings from Last 3 Encounters:  07/28/18 186 lb (84.4 kg)  07/28/18 186 lb 3.2 oz (84.5 kg)  01/06/18 181 lb 12.8 oz (82.5 kg)     Lab Results  Component Value Date   WBC 5.3 04/26/2017   HGB 11.9 (L) 04/26/2017   HCT 34.3 (L) 04/26/2017   PLT 196.0 04/26/2017   GLUCOSE 91 01/06/2018   CHOL 117 01/06/2018   TRIG 112.0 01/06/2018   HDL 46.90 01/06/2018   LDLCALC 48 01/06/2018   ALT 11 01/06/2018   AST 15 01/06/2018   NA 140 01/06/2018   K 3.9 01/06/2018   CL 104 01/06/2018   CREATININE 0.86 01/06/2018   BUN 19 01/06/2018   CO2 29 01/06/2018   TSH 4.09 01/06/2018   HGBA1C 5.4 01/10/2016   MICROALBUR 1.3 01/10/2016    Ct Abdomen Pelvis W Contrast  Result Date: 03/15/2017 CLINICAL DATA:  Right upper quadrant abdominal pain EXAM: CT ABDOMEN AND PELVIS  WITH CONTRAST TECHNIQUE: Multidetector CT imaging of the abdomen and pelvis was performed using the standard protocol following bolus administration of intravenous contrast. CONTRAST:  100 mL Isovue 300 COMPARISON:  04/23/2016 FINDINGS: Lower chest: Mild subpleural reticulation/ fibrosis the lung bases. Hepatobiliary: Liver is within normal limits. Gallbladder underdistended. Pancreas: Within normal limits. Spleen: Within normal limits. Adrenals/Urinary Tract: Adrenal glands within normal limits. Left kidney is within normal limits. Low-lying right pelvic kidney with two nonobstructing  right renal calculi measuring up to 9 mm in the right lower pole. No hydronephrosis. Bladder is within normal limits. Stomach/Bowel: Stomach is within normal limits. No evidence of bowel obstruction. Normal appendix (series 2/image 54). Left colonic diverticulosis, without evidence of diverticulitis. Vascular/Lymphatic: No evidence of abdominal aortic aneurysm. Atherosclerotic calcifications of the abdominal aorta and branch vessels. No suspicious abdominopelvic lymphadenopathy. Reproductive: Prostatomegaly with heterogeneous enhancement. Other: No abdominopelvic ascites. Small fat containing left inguinal hernia. Musculoskeletal: Mild to moderate degenerative changes of the lumbar spine. IMPRESSION: Stable right pelvic kidney with two nonobstructing calculi measuring up to 9 mm in the lower pole. No ureteral or bladder calculi. No hydronephrosis. Colonic diverticulosis, without evidence of diverticulitis. No evidence of bowel obstruction.  Normal appendix. Additional stable ancillary findings as above. Electronically Signed   By: Julian Hy M.D.   On: 03/15/2017 16:25     Assessment & Plan:  Plan  I have changed Cletus Gash Janowiak's finasteride. I am also having him maintain his aspirin and simvastatin. We administered cyanocobalamin. We will continue to administer cyanocobalamin.  Meds ordered this encounter  Medications    . simvastatin (ZOCOR) 20 MG tablet    Sig: TAKE 1 TABLET BY MOUTH EVERYDAY AT BEDTIME    Dispense:  90 tablet    Refill:  1  . finasteride (PROSCAR) 5 MG tablet    Sig: Take 1 tablet (5 mg total) by mouth daily.    Dispense:  90 tablet    Refill:  1  . cyanocobalamin ((VITAMIN B-12)) injection 1,000 mcg    Problem List Items Addressed This Visit      Unprioritized   Benign prostatic hyperplasia with urinary obstruction    Per urology      Relevant Medications   finasteride (PROSCAR) 5 MG tablet   Other Relevant Orders   PSA   Hx of TIA (transient ischemic attack) and stroke    Stable  No new symptoms       Hyperlipidemia LDL goal <100    Tolerating statin, encouraged heart healthy diet, avoid trans fats, minimize simple carbs and saturated fats. Increase exercise as tolerated      Relevant Medications   simvastatin (ZOCOR) 20 MG tablet   Other Relevant Orders   CBC with Differential/Platelet   Lipid panel   Comprehensive metabolic panel   Hypothyroidism    Check labs stable      Relevant Orders   TSH   Preventative health care    ghm utd Check labs See AVS       Other Visit Diagnoses    Urinary frequency    -  Primary   Relevant Orders   POCT Urinalysis Dipstick (Automated) (Completed)   Suspicious nevus       Relevant Orders   Ambulatory referral to Dermatology   Vitamin B12 deficiency       Relevant Medications   cyanocobalamin ((VITAMIN B-12)) injection 1,000 mcg (Completed)   Other Relevant Orders   Vitamin B12      Follow-up: Return in about 6 months (around 01/26/2019), or if symptoms worsen or fail to improve, for hyperlipidemia.  [name], DO

## 2018-07-28 NOTE — Assessment & Plan Note (Signed)
Tolerating statin, encouraged heart healthy diet, avoid trans fats, minimize simple carbs and saturated fats. Increase exercise as tolerated 

## 2018-07-28 NOTE — Progress Notes (Signed)
Reviewed  Kerin Cecchi R Lowne Chase, DO  

## 2018-07-28 NOTE — Assessment & Plan Note (Signed)
ghm utd Check labs See AVS 

## 2018-07-28 NOTE — Patient Instructions (Signed)
Preventive Care 2 Years and Older, Male Preventive care refers to lifestyle choices and visits with your health care provider that can promote health and wellness. What does preventive care include?   A yearly physical exam. This is also called an annual well check.  Dental exams once or twice a year.  Routine eye exams. Ask your health care provider how often you should have your eyes checked.  Personal lifestyle choices, including: ? Daily care of your teeth and gums. ? Regular physical activity. ? Eating a healthy diet. ? Avoiding tobacco and drug use. ? Limiting alcohol use. ? Practicing safe sex. ? Taking low doses of aspirin every day. ? Taking vitamin and mineral supplements as recommended by your health care provider. What happens during an annual well check? The services and screenings done by your health care provider during your annual well check will depend on your age, overall health, lifestyle risk factors, and family history of disease. Counseling Your health care provider may ask you questions about your:  Alcohol use.  Tobacco use.  Drug use.  Emotional well-being.  Home and relationship well-being.  Sexual activity.  Eating habits.  History of falls.  Memory and ability to understand (cognition).  Work and work Statistician. Screening You may have the following tests or measurements:  Height, weight, and BMI.  Blood pressure.  Lipid and cholesterol levels. These may be checked every 5 years, or more frequently if you are over 9 years old.  Skin check.  Lung cancer screening. You may have this screening every year starting at age 57 if you have a 30-pack-year history of smoking and currently smoke or have quit within the past 15 years.  Colorectal cancer screening. All adults should have this screening starting at age 90 and continuing until age 69. You will have tests every 1-10 years, depending on your results and the type of screening  test. People at increased risk should start screening at an earlier age. Screening tests may include: ? Guaiac-based fecal occult blood testing. ? Fecal immunochemical test (FIT). ? Stool DNA test. ? Virtual colonoscopy. ? Sigmoidoscopy. During this test, a flexible tube with a tiny camera (sigmoidoscope) is used to examine your rectum and lower colon. The sigmoidoscope is inserted through your anus into your rectum and lower colon. ? Colonoscopy. During this test, a long, thin, flexible tube with a tiny camera (colonoscope) is used to examine your entire colon and rectum.  Prostate cancer screening. Recommendations will vary depending on your family history and other risks.  Hepatitis C blood test.  Hepatitis B blood test.  Sexually transmitted disease (STD) testing.  Diabetes screening. This is done by checking your blood sugar (glucose) after you have not eaten for a while (fasting). You may have this done every 1-3 years.  Abdominal aortic aneurysm (AAA) screening. You may need this if you are a current or former smoker.  Osteoporosis. You may be screened starting at age 30 if you are at high risk. Talk with your health care provider about your test results, treatment options, and if necessary, the need for more tests. Vaccines Your health care provider may recommend certain vaccines, such as:  Influenza vaccine. This is recommended every year.  Tetanus, diphtheria, and acellular pertussis (Tdap, Td) vaccine. You may need a Td booster every 10 years.  Varicella vaccine. You may need this if you have not been vaccinated.  Zoster vaccine. You may need this after age 42.  Measles, mumps, and rubella (MMR) vaccine.  You may need at least one dose of MMR if you were born in 1957 or later. You may also need a second dose.  Pneumococcal 13-valent conjugate (PCV13) vaccine. One dose is recommended after age 65.  Pneumococcal polysaccharide (PPSV23) vaccine. One dose is recommended  after age 65.  Meningococcal vaccine. You may need this if you have certain conditions.  Hepatitis A vaccine. You may need this if you have certain conditions or if you travel or work in places where you may be exposed to hepatitis A.  Hepatitis B vaccine. You may need this if you have certain conditions or if you travel or work in places where you may be exposed to hepatitis B.  Haemophilus influenzae type b (Hib) vaccine. You may need this if you have certain risk factors. Talk to your health care provider about which screenings and vaccines you need and how often you need them. This information is not intended to replace advice given to you by your health care provider. Make sure you discuss any questions you have with your health care provider. Document Released: 08/05/2015 Document Revised: 08/29/2017 Document Reviewed: 05/10/2015 Elsevier Interactive Patient Education  2019 [employer] Inc[employer]  

## 2018-07-28 NOTE — Patient Instructions (Signed)
Please schedule your next medicare wellness visit with me in 1 yr.  Continue to eat heart healthy diet (full of fruits, vegetables, whole grains, lean protein, water--limit salt, fat, and sugar intake) and increase physical activity as tolerated.  Continue doing brain stimulating activities (puzzles, reading, adult coloring books, staying active) to keep memory sharp.    Justin [name] , Thank you for taking time to come for your Medicare Wellness Visit. I appreciate your ongoing commitment to your health goals. Please review the following plan we discussed and let me know if I can assist you in the future.   These are the goals we discussed: Goals    . DIET - INCREASE WATER INTAKE       This is a list of the screening recommended for you and due dates:  Health Maintenance  Topic Date Due  . Pneumonia vaccines (1 of 2 - PCV13) 06/13/1992  . Tetanus Vaccine  12/04/2022  . Flu Shot  Completed    Health Maintenance After Age 77 After age 37, you are at a higher risk for certain long-term diseases and infections as well as injuries from falls. Falls are a major cause of broken bones and head injuries in people who are older than age 41. Getting regular preventive care can help to keep you healthy and well. Preventive care includes getting regular testing and making lifestyle changes as recommended by your health care provider. Talk with your health care provider about:  Which screenings and tests you should have. A screening is a test that checks for a disease when you have no symptoms.  A diet and exercise plan that is right for you. What should I know about screenings and tests to prevent falls? Screening and testing are the best ways to find a health problem early. Early diagnosis and treatment give you the best chance of managing medical conditions that are common after age 71. Certain conditions and lifestyle choices may make you more likely to have a fall. Your health care provider may  recommend:  Regular vision checks. Poor vision and conditions such as cataracts can make you more likely to have a fall. If you wear glasses, make sure to get your prescription updated if your vision changes.  Medicine review. Work with your health care provider to regularly review all of the medicines you are taking, including over-the-counter medicines. Ask your health care provider about any side effects that may make you more likely to have a fall. Tell your health care provider if any medicines that you take make you feel dizzy or sleepy.  Osteoporosis screening. Osteoporosis is a condition that causes the bones to get weaker. This can make the bones weak and cause them to break more easily.  Blood pressure screening. Blood pressure changes and medicines to control blood pressure can make you feel dizzy.  Strength and balance checks. Your health care provider may recommend certain tests to check your strength and balance while standing, walking, or changing positions.  Foot health exam. Foot pain and numbness, as well as not wearing proper footwear, can make you more likely to have a fall.  Depression screening. You may be more likely to have a fall if you have a fear of falling, feel emotionally low, or feel unable to do activities that you used to do.  Alcohol use screening. Using too much alcohol can affect your balance and may make you more likely to have a fall. What actions can I take to lower my  risk of falls? General instructions  Talk with your health care provider about your risks for falling. Tell your health care provider if: ? You fall. Be sure to tell your health care provider about all falls, even ones that seem minor. ? You feel dizzy, sleepy, or off-balance.  Take over-the-counter and prescription medicines only as told by your health care provider. These include any supplements.  Eat a healthy diet and maintain a healthy weight. A healthy diet includes low-fat dairy  products, low-fat (lean) meats, and fiber from whole grains, beans, and lots of fruits and vegetables. Home safety  Remove any tripping hazards, such as rugs, cords, and clutter.  Install safety equipment such as grab bars in bathrooms and safety rails on stairs.  Keep rooms and walkways well-lit. Activity   Follow a regular exercise program to stay fit. This will help you maintain your balance. Ask your health care provider what types of exercise are appropriate for you.  If you need a cane or walker, use it as recommended by your health care provider.  Wear supportive shoes that have nonskid soles. Lifestyle  Do not drink alcohol if your health care provider tells you not to drink.  If you drink alcohol, limit how much you have: ? 0-1 drink a day for women. ? 0-2 drinks a day for men.  Be aware of how much alcohol is in your drink. In the [place] one drink equals one typical bottle of beer (12 oz), one-half glass of wine (5 oz), or one shot of hard liquor (1 oz).  Do not use any products that contain nicotine or tobacco, such as cigarettes and e-cigarettes. If you need help quitting, ask your health care provider. Summary  Having a healthy lifestyle and getting preventive care can help to protect your health and wellness after age 52.  Screening and testing are the best way to find a health problem early and help you avoid having a fall. Early diagnosis and treatment give you the best chance for managing medical conditions that are more common for people who are older than age 33.  Falls are a major cause of broken bones and head injuries in people who are older than age 28. Take precautions to prevent a fall at home.  Work with your health care provider to learn what changes you can make to improve your health and wellness and to prevent falls. This information is not intended to replace advice given to you by your health care provider. Make sure you discuss any questions you  have with your health care provider. Document Released: 05/22/2017 Document Revised: 05/22/2017 Document Reviewed: 05/22/2017 Elsevier Interactive Patient Education  2019 [employer].

## 2018-07-28 NOTE — Assessment & Plan Note (Signed)
Stable No new symptoms  

## 2018-07-28 NOTE — Assessment & Plan Note (Signed)
Check labs stable 

## 2018-07-28 NOTE — Assessment & Plan Note (Signed)
Per u rology 

## 2018-07-30 DIAGNOSIS — D485 Neoplasm of uncertain behavior of skin: Secondary | ICD-10-CM | POA: Diagnosis not present

## 2018-07-30 DIAGNOSIS — L821 Other seborrheic keratosis: Secondary | ICD-10-CM | POA: Diagnosis not present

## 2018-08-02 ENCOUNTER — Other Ambulatory Visit: Payer: Self-pay | Admitting: Family Medicine

## 2018-08-02 DIAGNOSIS — E039 Hypothyroidism, unspecified: Secondary | ICD-10-CM

## 2018-09-01 DIAGNOSIS — N400 Enlarged prostate without lower urinary tract symptoms: Secondary | ICD-10-CM | POA: Diagnosis not present

## 2018-09-01 DIAGNOSIS — R1031 Right lower quadrant pain: Secondary | ICD-10-CM | POA: Diagnosis not present

## 2018-09-01 DIAGNOSIS — Z87442 Personal history of urinary calculi: Secondary | ICD-10-CM | POA: Diagnosis not present

## 2018-09-01 DIAGNOSIS — N401 Enlarged prostate with lower urinary tract symptoms: Secondary | ICD-10-CM | POA: Diagnosis not present

## 2018-09-01 DIAGNOSIS — R109 Unspecified abdominal pain: Secondary | ICD-10-CM | POA: Diagnosis not present

## 2018-10-06 ENCOUNTER — Other Ambulatory Visit (INDEPENDENT_AMBULATORY_CARE_PROVIDER_SITE_OTHER): Payer: PPO

## 2018-10-06 ENCOUNTER — Other Ambulatory Visit: Payer: Self-pay

## 2018-10-06 DIAGNOSIS — E039 Hypothyroidism, unspecified: Secondary | ICD-10-CM

## 2018-10-07 ENCOUNTER — Encounter: Payer: Self-pay | Admitting: *Deleted

## 2018-10-07 ENCOUNTER — Ambulatory Visit (INDEPENDENT_AMBULATORY_CARE_PROVIDER_SITE_OTHER): Payer: PPO

## 2018-10-07 DIAGNOSIS — E538 Deficiency of other specified B group vitamins: Secondary | ICD-10-CM | POA: Diagnosis not present

## 2018-10-07 LAB — THYROID PANEL WITH TSH
Free Thyroxine Index: 1.9 (ref 1.4–3.8)
T3 Uptake: 33 % (ref 22–35)
T4, Total: 5.7 ug/dL (ref 4.9–10.5)
TSH: 3.98 mIU/L (ref 0.40–4.50)

## 2018-10-07 MED ORDER — CYANOCOBALAMIN 1000 MCG/ML IJ SOLN
1000.0000 ug | Freq: Once | INTRAMUSCULAR | Status: AC
  Administered 2018-10-07: 1000 ug via INTRAMUSCULAR

## 2018-10-07 NOTE — Progress Notes (Addendum)
Pre visit review using our clinic tool,if applicable. No additional management support is needed unless otherwise documented below in the visit note.   Pt here for monthly B12 injection per order from Dr. Carollee Herter.  B12 1024mcg given IM right deltoid, and pt tolerated injection well.  Patient states he will call back to schedule next B12 injection.   [name], DO

## 2018-11-26 ENCOUNTER — Ambulatory Visit (INDEPENDENT_AMBULATORY_CARE_PROVIDER_SITE_OTHER): Payer: PPO

## 2018-11-26 ENCOUNTER — Other Ambulatory Visit: Payer: Self-pay

## 2018-11-26 DIAGNOSIS — E538 Deficiency of other specified B group vitamins: Secondary | ICD-10-CM

## 2018-11-26 MED ORDER — CYANOCOBALAMIN 1000 MCG/ML IJ SOLN
1000.0000 ug | Freq: Once | INTRAMUSCULAR | Status: AC
  Administered 2018-11-26: 1000 ug via INTRAMUSCULAR

## 2018-11-26 NOTE — Progress Notes (Signed)
Pre visit review using our clinic tool,if applicable. No additional management support is needed unless otherwise documented below in the visit note.   Pt here for monthly B12 injection per order from Dr. Carollee Herter.  B12 1066mcg given IM left deltoid, and pt tolerated injection well.  Next B12 injection scheduled for June 2020.

## 2018-11-26 NOTE — Progress Notes (Signed)
Reviewed  Justin Koslowski R Lowne Chase, DO  

## 2018-12-04 DIAGNOSIS — Z961 Presence of intraocular lens: Secondary | ICD-10-CM | POA: Diagnosis not present

## 2018-12-04 DIAGNOSIS — H3552 Pigmentary retinal dystrophy: Secondary | ICD-10-CM | POA: Diagnosis not present

## 2018-12-04 DIAGNOSIS — H353131 Nonexudative age-related macular degeneration, bilateral, early dry stage: Secondary | ICD-10-CM | POA: Diagnosis not present

## 2018-12-30 ENCOUNTER — Ambulatory Visit: Payer: PPO

## 2018-12-30 DIAGNOSIS — L570 Actinic keratosis: Secondary | ICD-10-CM | POA: Diagnosis not present

## 2018-12-30 DIAGNOSIS — L819 Disorder of pigmentation, unspecified: Secondary | ICD-10-CM | POA: Diagnosis not present

## 2018-12-30 DIAGNOSIS — L814 Other melanin hyperpigmentation: Secondary | ICD-10-CM | POA: Diagnosis not present

## 2018-12-30 DIAGNOSIS — D229 Melanocytic nevi, unspecified: Secondary | ICD-10-CM | POA: Diagnosis not present

## 2018-12-30 DIAGNOSIS — L821 Other seborrheic keratosis: Secondary | ICD-10-CM | POA: Diagnosis not present

## 2019-01-19 ENCOUNTER — Other Ambulatory Visit: Payer: Self-pay

## 2019-01-19 ENCOUNTER — Emergency Department (HOSPITAL_COMMUNITY)
Admission: EM | Admit: 2019-01-19 | Discharge: 2019-01-19 | Disposition: A | Discharge status: Home / Self Care | Payer: PPO | Attending: Emergency Medicine | Admitting: Emergency Medicine

## 2019-01-19 DIAGNOSIS — Y929 Unspecified place or not applicable: Secondary | ICD-10-CM | POA: Diagnosis not present

## 2019-01-19 DIAGNOSIS — Z85828 Personal history of other malignant neoplasm of skin: Secondary | ICD-10-CM | POA: Diagnosis not present

## 2019-01-19 DIAGNOSIS — X58XXXA Exposure to other specified factors, initial encounter: Secondary | ICD-10-CM | POA: Insufficient documentation

## 2019-01-19 DIAGNOSIS — T162XXA Foreign body in left ear, initial encounter: Secondary | ICD-10-CM | POA: Insufficient documentation

## 2019-01-19 DIAGNOSIS — Z8673 Personal history of transient ischemic attack (TIA), and cerebral infarction without residual deficits: Secondary | ICD-10-CM | POA: Insufficient documentation

## 2019-01-19 DIAGNOSIS — Y9389 Activity, other specified: Secondary | ICD-10-CM | POA: Insufficient documentation

## 2019-01-19 DIAGNOSIS — Z79899 Other long term (current) drug therapy: Secondary | ICD-10-CM | POA: Diagnosis not present

## 2019-01-19 DIAGNOSIS — Z87891 Personal history of nicotine dependence: Secondary | ICD-10-CM | POA: Insufficient documentation

## 2019-01-19 DIAGNOSIS — Y998 Other external cause status: Secondary | ICD-10-CM | POA: Insufficient documentation

## 2019-01-19 DIAGNOSIS — E039 Hypothyroidism, unspecified: Secondary | ICD-10-CM | POA: Diagnosis not present

## 2019-01-19 MED ORDER — HYDROGEN PEROXIDE 3 % EX SOLN
CUTANEOUS | Status: AC
  Filled 2019-01-19: qty 473

## 2019-01-19 MED ORDER — LIDOCAINE HCL 2 % IJ SOLN
10.0000 mL | Freq: Once | INTRAMUSCULAR | Status: AC
  Administered 2019-01-19: 200 mg
  Filled 2019-01-19: qty 20

## 2019-01-19 MED ORDER — CIPROFLOXACIN HCL 0.2 % OT SOLN: 0.2000 mL | Freq: Two times a day (BID) | OTIC | 0 refills | Status: DC

## 2019-01-19 NOTE — ED Notes (Signed)
ENT cart at bedside

## 2019-01-19 NOTE — ED Triage Notes (Signed)
Pt reports bug in left ear. PT alert and oriented and in no active distress

## 2019-01-19 NOTE — ED Provider Notes (Signed)
[name] DEPT Provider Note   CSN: [id number] Arrival date & time: 01/19/19  1638    History   Chief Complaint Chief Complaint  Patient presents with  . Foreign Body in [place] is a 85 y.o. male.     HPI    Justin [name] is a 85 y.o. male, with a history of anemia, BPH, CKD, hyperlipidemia, TIA, presenting to the ED with a foreign body in the left ear that occurred around 3:30 PM today.  Patient states he thinks there is a bug in his ear because he can feel it moving.  Pain is moderate, sharp and aching, nonradiating. Tdap updated 2014. Denies fever, hearing loss, headache, bleeding or drainage from the ear, dizziness, or any other complaints.     Past Medical History:  Diagnosis Date  . Anemia   . Basal cell cancer    Dr Wilhemina Bonito  . Benign prostatic hypertrophy    Dr Amalia Hailey  . Carpal tunnel syndrome 10/19/2015   Right  . Chronic kidney disease   . Colon polyp   . Diverticulosis   . Hyperlipidemia   . Kidney stones   . Shingles   . Stroke ([place])   . Transient ischemic attack    Dr Leonie Man    Patient Active Problem List   Diagnosis Date Noted  . Dizziness 04/26/2017  . Dehydration 04/26/2017  . Allergic reaction caused by a drug 04/03/2017  . Preventative health care 10/27/2016  . Left flank pain 04/23/2016  . Hypothyroidism 10/25/2015  . Carpal tunnel syndrome 10/19/2015  . Pain, joint, hand, right 08/22/2015  . B12 deficiency 12/31/2011  . Benign prostatic hyperplasia with urinary obstruction 09/25/2011  . SKIN CANCER, HX OF 03/31/2010  . DIVERTICULOSIS, COLON 05/10/2008  . Hx of TIA (transient ischemic attack) and stroke 05/10/2008  . History of colonic polyps 05/10/2008  . NEPHROLITHIASIS, HX OF 05/10/2008  . Hyperlipidemia LDL goal <100 09/24/2007  . Anemia 12/25/2006    Past Surgical History:  Procedure Laterality Date  . CATARACT EXTRACTION, BILATERAL    . COLONOSCOPY W/ POLYPECTOMY  01/2009    adenomatous, Dr. Alben Spittle  . HEMORRHOID SURGERY    . LITHOTRIPSY      X 2;DrAmalia Hailey  . PROSTATE BIOPSY     X2        Home Medications    Prior to Admission medications   Medication Sig Start Date End Date Taking? Authorizing Provider  aspirin 81 MG tablet Take 81 mg by mouth daily.    [provider]  Ciprofloxacin HCl 0.2 % otic solution Place 0.2 mLs into the left ear 2 (two) times daily. 01/19/19   Lotus Santillo C, PA-C  finasteride (PROSCAR) 5 MG tablet Take 1 tablet (5 mg total) by mouth daily. 07/28/18   [name], DO  simvastatin (ZOCOR) 20 MG tablet TAKE 1 TABLET BY MOUTH EVERYDAY AT BEDTIME 07/28/18   [name], DO    Family History Family History  Problem Relation Age of Onset  . Heart failure Father   . Heart attack Father 51  . Diabetes Mother   . Colon cancer Mother   . Prostate cancer Brother   . Stroke Brother   . Coronary artery disease Brother        7 vessel CBAG  . Diabetes Brother   . Heart attack Other        2 P uncles , both > 55  .  Stroke Brother        in late 51s  . Colon polyps Sister   . Heart attack Brother   . Other Sister        brain hemorrhage due to a fall    Social History Social History   Tobacco Use  . Smoking status: Former Smoker    Types: Cigarettes, Pipe, Cigars    Quit date: 07/23/1976    Years since quitting: 42.5  . Smokeless tobacco: Former Systems developer    Types: Chew  . Tobacco comment: smoked 1943-1978, up to < 1 ppd  Substance Use Topics  . Alcohol use: No    Alcohol/week: 1.0 standard drinks    Types: 1 Glasses of wine per week    Comment: rare wine  . Drug use: No     Allergies   Prednisone, Inderal [propranolol], and Omeprazole   Review of Systems Review of Systems  Constitutional: Negative for fever.  HENT: Positive for ear pain.        Foreign body in left ear canal  Gastrointestinal: Negative for nausea and vomiting.  Neurological: Negative for dizziness and headaches.      Physical Exam Updated Vital Signs BP (!) 182/76 (BP Location: Right Arm)   Pulse 71   Temp 98.1 F (36.7 C) (Oral)   Resp 20   SpO2 99%   Physical Exam Vitals signs and nursing note reviewed.  Constitutional:      General: He is not in acute distress.    Appearance: He is well-developed. He is not diaphoretic.  HENT:     Head: Normocephalic and atraumatic.     Right Ear: Tympanic membrane, ear canal and external ear normal.     Left Ear: External ear normal. A foreign body is present.     Ears:     Comments: There is a beetle in the left ear canal.  It does not appear to be moving.  Small amount of blood in the canal without actively flowing hemorrhage.  TM is obscured by the insect. Eyes:     Conjunctiva/sclera: Conjunctivae normal.  Neck:     Musculoskeletal: Neck supple.  Cardiovascular:     Rate and Rhythm: Normal rate and regular rhythm.  Pulmonary:     Effort: Pulmonary effort is normal.  Skin:    General: Skin is warm and dry.     Coloration: Skin is not pale.  Neurological:     Mental Status: He is alert.     Comments: Patient is able to ambulate without assistance or noted difficulty.  Psychiatric:        Behavior: Behavior normal.      ED Treatments / Results  Labs (all labs ordered are listed, but only abnormal results are displayed) Labs Reviewed - No data to display  EKG None  Radiology No results found.  Procedures .Foreign Body Removal  Date/Time: 01/19/2019 6:30 PM Performed by: Lorayne Bender, PA-C Authorized by: Lorayne Bender, PA-C  Consent: Verbal consent obtained. Risks and benefits: risks, benefits and alternatives were discussed Consent given by: patient Patient understanding: patient states understanding of the procedure being performed Patient identity confirmed: verbally with patient and provided demographic data Body area: ear Location details: left ear Anesthesia: see MAR for details  Anesthesia: Local Anesthetic:  lidocaine 2% without epinephrine  Sedation: Patient sedated: no  Patient restrained: no Patient cooperative: yes Localization method: ENT speculum Removal mechanism: forceps, suction, irrigation and alligator forceps Complexity: complex Post-procedure assessment: foreign body not  removed Patient tolerance: patient tolerated the procedure well with no immediate complications   (including critical care time)  Medications Ordered in ED Medications  hydrogen peroxide 3 % external solution (has no administration in time range)  lidocaine (XYLOCAINE) 2 % (with pres) injection 200 mg (has no administration in time range)     Initial Impression / Assessment and Plan / ED Course  I have reviewed the triage vital signs and the nursing notes.  Pertinent labs & imaging results that were available during my care of the patient were reviewed by me and considered in my medical decision making (see chart for details).  Clinical Course as of Jan 19 1908  Mon Jan 19, 2019  1849 Spoke with Dr. Blenda Nicely, ENT. Recommends patient follow-up in their clinic tomorrow.  Call tomorrow morning at 8 AM to be seen later in the day.  May start the patient on Cortisporin eardrops.   [SJ]    Clinical Course User Index [SJ] Christropher Gintz C, PA-C       Patient presents with an insect in the left ear canal.  Patient stated he could feel the insect moving.  Lidocaine was used to kill the insect.  Attempts.  To remove the insect here in the ED were unsuccessful.  Patient will be seen by ENT in the clinic tomorrow.  Findings and plan of care discussed with Milton Ferguson, MD.  ENT on call recommended Cortisporin, however, I had concerns regarding cross-reactivity due to his listed hives reaction to prednisone.  After discussing the issue with Dr. Roderic Palau, we decided to use Cipro drops instead.  Final Clinical Impressions(s) / ED Diagnoses   Final diagnoses:  Foreign body of left ear, initial encounter    ED  Discharge Orders         Ordered    Ciprofloxacin HCl 0.2 % otic solution  2 times daily     01/19/19 1907           Layla Maw 01/19/19 1912    Milton Ferguson, MD 01/20/19 1254

## 2019-01-19 NOTE — Discharge Instructions (Signed)
Use the antibiotic drops, as prescribed, twice daily for 7 days. Call the ear nose and throat (ENT) clinic tomorrow morning when they open at 8 AM to set up an appointment to be seen later in the day. For pain, you may try Tylenol or, if tolerated, ibuprofen.

## 2019-01-20 DIAGNOSIS — T162XXA Foreign body in left ear, initial encounter: Secondary | ICD-10-CM | POA: Diagnosis not present

## 2019-01-20 DIAGNOSIS — H6121 Impacted cerumen, right ear: Secondary | ICD-10-CM | POA: Diagnosis not present

## 2019-01-26 ENCOUNTER — Ambulatory Visit: Payer: PPO | Admitting: Family Medicine

## 2019-02-03 ENCOUNTER — Ambulatory Visit (INDEPENDENT_AMBULATORY_CARE_PROVIDER_SITE_OTHER): Payer: PPO

## 2019-02-03 ENCOUNTER — Other Ambulatory Visit: Payer: Self-pay

## 2019-02-03 DIAGNOSIS — E538 Deficiency of other specified B group vitamins: Secondary | ICD-10-CM | POA: Diagnosis not present

## 2019-02-03 MED ORDER — CYANOCOBALAMIN 1000 MCG/ML IJ SOLN
1000.0000 ug | Freq: Once | INTRAMUSCULAR | Status: AC
  Administered 2019-02-03: 1000 ug via INTRAMUSCULAR

## 2019-02-03 NOTE — Progress Notes (Addendum)
Pt here today for B12 injection. Cyanocobalamin 72mL injected into L deltoid. Pt tolerated injection well.   Next in 4 weeks.   [name], DO

## 2019-03-03 DIAGNOSIS — R351 Nocturia: Secondary | ICD-10-CM | POA: Diagnosis not present

## 2019-03-19 ENCOUNTER — Ambulatory Visit (INDEPENDENT_AMBULATORY_CARE_PROVIDER_SITE_OTHER): Payer: PPO

## 2019-03-19 ENCOUNTER — Other Ambulatory Visit: Payer: Self-pay

## 2019-03-19 DIAGNOSIS — E538 Deficiency of other specified B group vitamins: Secondary | ICD-10-CM | POA: Diagnosis not present

## 2019-03-19 MED ORDER — CYANOCOBALAMIN 1000 MCG/ML IJ SOLN
1000.0000 ug | Freq: Once | INTRAMUSCULAR | Status: AC
  Administered 2019-03-19: 1000 ug via INTRAMUSCULAR

## 2019-03-19 NOTE — Progress Notes (Signed)
Pt here for monthly B12 injection per Dr. Etter Sjogren  B12 1060mcg given IM in left deltoid and pt tolerated injection well.  Next B12 injection scheduled for next month.

## 2019-03-31 NOTE — Progress Notes (Signed)
Dr. Ulyses Jarred can you please sign off on this so they can bill it corrrectly?

## 2019-03-31 NOTE — Progress Notes (Signed)
Noted  Ryin Schillo R Lowne Chase, DO  

## 2019-04-21 ENCOUNTER — Ambulatory Visit (INDEPENDENT_AMBULATORY_CARE_PROVIDER_SITE_OTHER): Payer: PPO | Admitting: *Deleted

## 2019-04-21 ENCOUNTER — Other Ambulatory Visit: Payer: Self-pay

## 2019-04-21 DIAGNOSIS — E538 Deficiency of other specified B group vitamins: Secondary | ICD-10-CM

## 2019-04-21 MED ORDER — CYANOCOBALAMIN 1000 MCG/ML IJ SOLN
1000.0000 ug | Freq: Once | INTRAMUSCULAR | Status: AC
  Administered 2019-04-21: 1000 ug via INTRAMUSCULAR

## 2019-04-21 NOTE — Progress Notes (Signed)
Pt here for monthly B12 injection per Dr. Etter Sjogren  B12 1051mcg given IM in right deltoid and pt tolerated injection well.  Next B12 injection scheduled for next month.

## 2019-04-28 ENCOUNTER — Other Ambulatory Visit: Payer: Self-pay | Admitting: *Deleted

## 2019-04-28 DIAGNOSIS — L570 Actinic keratosis: Secondary | ICD-10-CM | POA: Diagnosis not present

## 2019-04-28 DIAGNOSIS — L819 Disorder of pigmentation, unspecified: Secondary | ICD-10-CM | POA: Diagnosis not present

## 2019-04-28 DIAGNOSIS — E785 Hyperlipidemia, unspecified: Secondary | ICD-10-CM

## 2019-04-28 DIAGNOSIS — D485 Neoplasm of uncertain behavior of skin: Secondary | ICD-10-CM | POA: Diagnosis not present

## 2019-04-28 DIAGNOSIS — C44329 Squamous cell carcinoma of skin of other parts of face: Secondary | ICD-10-CM | POA: Diagnosis not present

## 2019-04-28 MED ORDER — SIMVASTATIN 20 MG PO TABS: ORAL_TABLET | ORAL | 0 refills | Status: DC

## 2019-05-22 ENCOUNTER — Ambulatory Visit (INDEPENDENT_AMBULATORY_CARE_PROVIDER_SITE_OTHER): Payer: PPO | Admitting: *Deleted

## 2019-05-22 ENCOUNTER — Other Ambulatory Visit: Payer: Self-pay

## 2019-05-22 DIAGNOSIS — E538 Deficiency of other specified B group vitamins: Secondary | ICD-10-CM

## 2019-05-22 DIAGNOSIS — Z23 Encounter for immunization: Secondary | ICD-10-CM

## 2019-05-22 MED ORDER — CYANOCOBALAMIN 1000 MCG/ML IJ SOLN
1000.0000 ug | Freq: Once | INTRAMUSCULAR | Status: AC
  Administered 2019-05-22: 1000 ug via INTRAMUSCULAR

## 2019-05-22 NOTE — Progress Notes (Signed)
Pt here for monthly B12 injection perDr. Etter Sjogren and flu vaccine.  B12 1068mcg given IMin left deltoidand pt tolerated injection well.  Flu vaccine given in right deltoid and patient tolerated well.  Follow up scheduled with Dr. Etter Sjogren next month and at that time he will get next b12.

## 2019-05-26 ENCOUNTER — Ambulatory Visit (HOSPITAL_BASED_OUTPATIENT_CLINIC_OR_DEPARTMENT_OTHER)
Admission: RE | Admit: 2019-05-26 | Discharge: 2019-05-26 | Disposition: A | Discharge status: Home / Self Care | Payer: PPO | Source: Ambulatory Visit | Attending: Family Medicine | Admitting: Family Medicine

## 2019-05-26 ENCOUNTER — Other Ambulatory Visit: Payer: Self-pay

## 2019-05-26 ENCOUNTER — Ambulatory Visit (INDEPENDENT_AMBULATORY_CARE_PROVIDER_SITE_OTHER): Payer: PPO | Admitting: Family Medicine

## 2019-05-26 ENCOUNTER — Encounter: Payer: Self-pay | Admitting: Family Medicine

## 2019-05-26 VITALS — BP 140/60 | HR 65 | Temp 97.3°F | Resp 18 | Ht 70.0 in | Wt 186.8 lb

## 2019-05-26 DIAGNOSIS — M545 Low back pain, unspecified: Secondary | ICD-10-CM | POA: Insufficient documentation

## 2019-05-26 DIAGNOSIS — S3992XA Unspecified injury of lower back, initial encounter: Secondary | ICD-10-CM | POA: Diagnosis not present

## 2019-05-26 DIAGNOSIS — E785 Hyperlipidemia, unspecified: Secondary | ICD-10-CM | POA: Diagnosis not present

## 2019-05-26 NOTE — Progress Notes (Signed)
Patient ID: Justin [name], male    DOB: 06-28-1925  Age: 85 y.o. MRN: [id number]    Subjective:  Subjective   HPI Justin [name] presents for f/u fall 10/12   He fell out of bed at night on his way to the bathroom.  It was on carpet and it has been hurting since  No dysuria, no frequency  He has a hx of hemorrhoids he keeps talking about -- he has a hx of them but says they have not been a problem but he does keep going back to that -- he does not want me to check them today-- he is using what he calls -- the 'silver bullet"    He will come back if they do not improve.    Review of Systems  Constitutional: Negative for appetite change, diaphoresis, fatigue and unexpected weight change.  Eyes: Negative for pain, redness and visual disturbance.  Respiratory: Negative for cough, chest tightness, shortness of breath and wheezing.   Cardiovascular: Negative for chest pain, palpitations and leg swelling.  Gastrointestinal: Negative for abdominal distention, abdominal pain, anal bleeding, blood in stool, constipation, diarrhea, nausea, rectal pain and vomiting.  Endocrine: Negative for cold intolerance, heat intolerance, polydipsia, polyphagia and polyuria.  Genitourinary: Negative for difficulty urinating, dysuria, flank pain, frequency, hematuria and urgency.  Musculoskeletal: Positive for back pain. Negative for neck pain and neck stiffness.  Neurological: Negative for dizziness, light-headedness, numbness and headaches.  Psychiatric/Behavioral: Negative.     History Past Medical History:  Diagnosis Date  . Anemia   . Basal cell cancer    Dr Wilhemina Bonito  . Benign prostatic hypertrophy    Dr Amalia Hailey  . Carpal tunnel syndrome 10/19/2015   Right  . Chronic kidney disease   . Colon polyp   . Diverticulosis   . Hyperlipidemia   . Kidney stones   . Shingles   . Stroke ([place])   . Transient ischemic attack    Dr Leonie Man    He has a past surgical history that includes Lithotripsy; Hemorrhoid  surgery; Colonoscopy w/ polypectomy (01/2009); Cataract extraction, bilateral; and Prostate biopsy.   His family history includes Colon cancer in his mother; Colon polyps in his sister; Coronary artery disease in his brother; Diabetes in his brother and mother; Heart attack in his brother and another family member; Heart attack (age of onset: 37) in his father; Heart failure in his father; Other in his sister; Prostate cancer in his brother; Stroke in his brother and brother.He reports that he quit smoking about 42 years ago. His smoking use included cigarettes, pipe, and cigars. He has quit using smokeless tobacco.  His smokeless tobacco use included chew. He reports that he does not drink alcohol or use drugs.  Current Outpatient Medications on File Prior to Visit  Medication Sig Dispense Refill  . aspirin 81 MG tablet Take 81 mg by mouth daily.    . Ciprofloxacin HCl 0.2 % otic solution Place 0.2 mLs into the left ear 2 (two) times daily. 14 vial 0  . finasteride (PROSCAR) 5 MG tablet Take 1 tablet (5 mg total) by mouth daily. 90 tablet 1  . simvastatin (ZOCOR) 20 MG tablet TAKE 1 TABLET BY MOUTH EVERYDAY AT BEDTIME.  Needs ov before any more refills 90 tablet 0   Current Facility-Administered Medications on File Prior to Visit  Medication Dose Route Frequency Provider Last Rate Last Dose  . cyanocobalamin ((VITAMIN B-12)) injection 1,000 mcg  1,000 mcg Intramuscular Once [employer], Alferd Apa,  DO         Objective:  Objective  Physical Exam Vitals signs and nursing note reviewed.  Constitutional:      General: He is sleeping.     Appearance: He is well-developed.  HENT:     Head: Normocephalic and atraumatic.  Eyes:     Pupils: Pupils are equal, round, and reactive to light.  Neck:     Musculoskeletal: Normal range of motion and neck supple.     Thyroid: No thyromegaly.  Cardiovascular:     Rate and Rhythm: Normal rate and regular rhythm.     Heart sounds: No murmur.  Pulmonary:      Effort: Pulmonary effort is normal. No respiratory distress.     Breath sounds: Normal breath sounds. No wheezing or rales.  Chest:     Chest wall: No tenderness.  Abdominal:     Palpations: Abdomen is soft.     Tenderness: There is no abdominal tenderness.  Musculoskeletal:        General: Tenderness and signs of injury present.     Lumbar back: He exhibits tenderness, bony tenderness and pain. He exhibits normal range of motion, no laceration and no spasm.       Back:  Skin:    General: Skin is warm and dry.  Neurological:     Mental Status: He is oriented to person, place, and time.  Psychiatric:        Behavior: Behavior normal.        Thought Content: Thought content normal.        Judgment: Judgment normal.    BP 140/60 (BP Location: Right Arm, Patient Position: Sitting, Cuff Size: Normal)   Pulse 65   Temp (!) 97.3 F (36.3 C) (Temporal)   Resp 18   Ht 5\' 10"  (1.778 m)   Wt 186 lb 12.8 oz (84.7 kg)   SpO2 98%   BMI 26.80 kg/m  Wt Readings from Last 3 Encounters:  05/26/19 186 lb 12.8 oz (84.7 kg)  07/28/18 186 lb (84.4 kg)  07/28/18 186 lb 3.2 oz (84.5 kg)     Lab Results  Component Value Date   WBC 7.3 07/28/2018   HGB 12.0 (L) 07/28/2018   HCT 34.4 (L) 07/28/2018   PLT 196.0 07/28/2018   GLUCOSE 79 07/28/2018   CHOL 118 07/28/2018   TRIG 113.0 07/28/2018   HDL 45.40 07/28/2018   LDLCALC 50 07/28/2018   ALT 11 07/28/2018   AST 15 07/28/2018   NA 139 07/28/2018   K 3.7 07/28/2018   CL 104 07/28/2018   CREATININE 0.85 07/28/2018   BUN 18 07/28/2018   CO2 30 07/28/2018   TSH 3.98 10/06/2018   PSA 1.32 07/28/2018   HGBA1C 5.4 01/10/2016   MICROALBUR 1.3 01/10/2016    No results found.   Assessment & Plan:  Plan  I am having Justin [name] maintain his aspirin, finasteride, Ciprofloxacin HCl, and simvastatin. We will continue to administer cyanocobalamin.  No orders of the defined types were placed in this encounter.   Problem List Items  Addressed This Visit      Unprioritized   Acute midline low back pain without sciatica - Primary    Check xrays  Moist heat Tylenol prn rto prn       Relevant Orders   DG Sacrum/Coccyx (Completed)   DG Lumbar Spine Complete (Completed)    Other Visit Diagnoses    Hyperlipidemia, unspecified hyperlipidemia type  Follow-up: Return in about 6 months (around 11/23/2019), or if symptoms worsen or fail to improve, for hypertension, hyperlipidemia.  [name], DO

## 2019-05-26 NOTE — Patient Instructions (Signed)
Acute Back Pain, Adult Acute back pain is sudden and usually short-lived. It is often caused by an injury to the muscles and tissues in the back. The injury may result from:  A muscle or ligament getting overstretched or torn (strained). Ligaments are tissues that connect bones to each other. Lifting something improperly can cause a back strain.  Wear and tear (degeneration) of the spinal disks. Spinal disks are circular tissue that provides cushioning between the bones of the spine (vertebrae).  Twisting motions, such as while playing sports or doing yard work.  A hit to the back.  Arthritis. You may have a physical exam, lab tests, and imaging tests to find the cause of your pain. Acute back pain usually goes away with rest and home care. Follow these instructions at home: Managing pain, stiffness, and swelling  Take over-the-counter and prescription medicines only as told by your health care provider.  Your health care provider may recommend applying ice during the first 24-48 hours after your pain starts. To do this: ? Put ice in a plastic bag. ? Place a towel between your skin and the bag. ? Leave the ice on for 20 minutes, 2-3 times a day.  If directed, apply heat to the affected area as often as told by your health care provider. Use the heat source that your health care provider recommends, such as a moist heat pack or a heating pad. ? Place a towel between your skin and the heat source. ? Leave the heat on for 20-30 minutes. ? Remove the heat if your skin turns bright red. This is especially important if you are unable to feel pain, heat, or cold. You have a greater risk of getting burned. Activity   Do not stay in bed. Staying in bed for more than 1-2 days can delay your recovery.  Sit up and stand up straight. Avoid leaning forward when you sit, or hunching over when you stand. ? If you work at a desk, sit close to it so you do not need to lean over. Keep your chin tucked  in. Keep your neck drawn back, and keep your elbows bent at a right angle. Your arms should look like the letter "L." ? Sit high and close to the steering wheel when you drive. Add lower back (lumbar) support to your car seat, if needed.  Take short walks on even surfaces as soon as you are able. Try to increase the length of time you walk each day.  Do not sit, drive, or stand in one place for more than 30 minutes at a time. Sitting or standing for long periods of time can put stress on your back.  Do not drive or use heavy machinery while taking prescription pain medicine.  Use proper lifting techniques. When you bend and lift, use positions that put less stress on your back: ? Bend your knees. ? Keep the load close to your body. ? Avoid twisting.  Exercise regularly as told by your health care provider. Exercising helps your back heal faster and helps prevent back injuries by keeping muscles strong and flexible.  Work with a physical therapist to make a safe exercise program, as recommended by your health care provider. Do any exercises as told by your physical therapist. Lifestyle  Maintain a healthy weight. Extra weight puts stress on your back and makes it difficult to have good posture.  Avoid activities or situations that make you feel anxious or stressed. Stress and anxiety increase muscle   tension and can make back pain worse. Learn ways to manage anxiety and stress, such as through exercise. General instructions  Sleep on a firm mattress in a comfortable position. Try lying on your side with your knees slightly bent. If you lie on your back, put a pillow under your knees.  Follow your treatment plan as told by your health care provider. This may include: ? Cognitive or behavioral therapy. ? Acupuncture or massage therapy. ? Meditation or yoga. Contact a health care provider if:  You have pain that is not relieved with rest or medicine.  You have increasing pain going down  into your legs or buttocks.  Your pain does not improve after 2 weeks.  You have pain at night.  You lose weight without trying.  You have a fever or chills. Get help right away if:  You develop new bowel or bladder control problems.  You have unusual weakness or numbness in your arms or legs.  You develop nausea or vomiting.  You develop abdominal pain.  You feel faint. Summary  Acute back pain is sudden and usually short-lived.  Use proper lifting techniques. When you bend and lift, use positions that put less stress on your back.  Take over-the-counter and prescription medicines and apply heat or ice as directed by your health care provider. This information is not intended to replace advice given to you by your health care provider. Make sure you discuss any questions you have with your health care provider. Document Released: 07/09/2005 Document Revised: 10/28/2018 Document Reviewed: 02/20/2017 Elsevier Patient Education  2020 Elsevier Inc.  

## 2019-05-26 NOTE — Assessment & Plan Note (Signed)
Check xrays  Moist heat Tylenol prn rto prn

## 2019-06-22 ENCOUNTER — Other Ambulatory Visit: Payer: Self-pay

## 2019-06-22 ENCOUNTER — Encounter: Payer: PPO | Admitting: Family Medicine

## 2019-06-22 ENCOUNTER — Ambulatory Visit: Payer: PPO | Admitting: Family Medicine

## 2019-06-30 ENCOUNTER — Ambulatory Visit (INDEPENDENT_AMBULATORY_CARE_PROVIDER_SITE_OTHER): Payer: PPO

## 2019-06-30 ENCOUNTER — Other Ambulatory Visit: Payer: Self-pay

## 2019-06-30 DIAGNOSIS — E538 Deficiency of other specified B group vitamins: Secondary | ICD-10-CM

## 2019-06-30 MED ORDER — CYANOCOBALAMIN 1000 MCG/ML IJ SOLN
1000.0000 ug | Freq: Once | INTRAMUSCULAR | Status: AC
  Administered 2019-06-30: 1000 ug via INTRAMUSCULAR

## 2019-06-30 NOTE — Progress Notes (Signed)
Noted  Justin Rodin R Lowne Chase, DO  

## 2019-06-30 NOTE — Progress Notes (Signed)
Pt here for monthly B12 injection per Dr. Lowne  B12 1000mcg given IM, and pt tolerated injection well.  Next B12 injection scheduled for next month.    

## 2019-07-01 DIAGNOSIS — L819 Disorder of pigmentation, unspecified: Secondary | ICD-10-CM | POA: Diagnosis not present

## 2019-07-01 DIAGNOSIS — L570 Actinic keratosis: Secondary | ICD-10-CM | POA: Diagnosis not present

## 2019-07-01 DIAGNOSIS — Z85828 Personal history of other malignant neoplasm of skin: Secondary | ICD-10-CM | POA: Diagnosis not present

## 2019-07-06 NOTE — Progress Notes (Signed)
This encounter was created in error - please disregard.

## 2019-07-22 ENCOUNTER — Telehealth: Payer: Self-pay | Admitting: Family Medicine

## 2019-07-22 NOTE — Telephone Encounter (Signed)
Called patient to schedule AWV, but no answer. Could not leave voicemail (no vm set up). Will try to call patient back at later time. SF

## 2019-07-25 ENCOUNTER — Other Ambulatory Visit: Payer: Self-pay | Admitting: Family Medicine

## 2019-07-25 DIAGNOSIS — E785 Hyperlipidemia, unspecified: Secondary | ICD-10-CM

## 2019-07-31 ENCOUNTER — Ambulatory Visit: Payer: PPO

## 2019-08-04 ENCOUNTER — Ambulatory Visit (INDEPENDENT_AMBULATORY_CARE_PROVIDER_SITE_OTHER): Payer: PPO

## 2019-08-04 ENCOUNTER — Other Ambulatory Visit: Payer: Self-pay

## 2019-08-04 DIAGNOSIS — E538 Deficiency of other specified B group vitamins: Secondary | ICD-10-CM | POA: Diagnosis not present

## 2019-08-04 MED ORDER — CYANOCOBALAMIN 1000 MCG/ML IJ SOLN
1000.0000 ug | Freq: Once | INTRAMUSCULAR | Status: AC
  Administered 2019-08-04: 1000 ug via INTRAMUSCULAR

## 2019-08-04 NOTE — Progress Notes (Signed)
Pt here for monthly B12 injection per PCP.   B12 1040mcg given IM, and pt tolerated injection well.  Next B12 injection scheduled for 09/09/2019.

## 2019-08-13 ENCOUNTER — Ambulatory Visit: Payer: PPO | Attending: Internal Medicine

## 2019-08-13 DIAGNOSIS — Z23 Encounter for immunization: Secondary | ICD-10-CM

## 2019-08-13 NOTE — Progress Notes (Signed)
   Covid-19 Vaccination Clinic  Name:  Justin [name]    MRN: [id number] DOB: 26-Oct-1924  08/13/2019  Justin [name] was observed post Covid-19 immunization for 15 minutes without incidence. He was provided with Vaccine Information Sheet and instruction to access the V-Safe system.   Justin [name] was instructed to call 911 with any severe reactions post vaccine: Marland Kitchen Difficulty breathing  . Swelling of your face and throat  . A fast heartbeat  . A bad rash all over your body  . Dizziness and weakness    Immunizations Administered    Name Date Dose VIS Date Route   Pfizer COVID-19 Vaccine 08/13/2019  1:41 PM 0.3 mL 07/03/2019 Intramuscular   Manufacturer: [place]   Lot: [id number]   [place]: [id number]

## 2019-09-03 ENCOUNTER — Ambulatory Visit: Payer: PPO | Attending: Internal Medicine

## 2019-09-03 DIAGNOSIS — Z23 Encounter for immunization: Secondary | ICD-10-CM | POA: Insufficient documentation

## 2019-09-03 NOTE — Progress Notes (Signed)
   Covid-19 Vaccination Clinic  Name:  Justin [name]    MRN: [id number] DOB: 06-Mar-1925  09/03/2019  [name] was observed post Covid-19 immunization for 15 minutes without incidence. He was provided with Vaccine Information Sheet and instruction to access the V-Safe system.   [name] was instructed to call 911 with any severe reactions post vaccine: Marland Kitchen Difficulty breathing  . Swelling of your face and throat  . A fast heartbeat  . A bad rash all over your body  . Dizziness and weakness    Immunizations Administered    Name Date Dose VIS Date Route   Pfizer COVID-19 Vaccine 09/03/2019  1:59 PM 0.3 mL 07/03/2019 Intramuscular   Manufacturer: [place]   Lot: EN [place]   [place]: [id number]

## 2019-09-09 ENCOUNTER — Ambulatory Visit (INDEPENDENT_AMBULATORY_CARE_PROVIDER_SITE_OTHER): Payer: PPO

## 2019-09-09 ENCOUNTER — Other Ambulatory Visit: Payer: Self-pay

## 2019-09-09 DIAGNOSIS — E538 Deficiency of other specified B group vitamins: Secondary | ICD-10-CM

## 2019-09-09 MED ORDER — CYANOCOBALAMIN 1000 MCG/ML IJ SOLN
1000.0000 ug | Freq: Once | INTRAMUSCULAR | Status: AC
  Administered 2019-09-09: 1000 ug via INTRAMUSCULAR

## 2019-09-09 NOTE — Progress Notes (Signed)
Pt here for monthly B12 injection per PCP order.  B12 1076mcg given IM in left deltoid, pt tolerated injection well.  Next B12 injection scheduled for 1 month.   Gerilyn Nestle, RN

## 2019-09-28 ENCOUNTER — Other Ambulatory Visit: Payer: Self-pay

## 2019-09-30 ENCOUNTER — Other Ambulatory Visit: Payer: Self-pay

## 2019-09-30 DIAGNOSIS — L821 Other seborrheic keratosis: Secondary | ICD-10-CM | POA: Diagnosis not present

## 2019-09-30 DIAGNOSIS — L905 Scar conditions and fibrosis of skin: Secondary | ICD-10-CM | POA: Diagnosis not present

## 2019-09-30 DIAGNOSIS — Z85828 Personal history of other malignant neoplasm of skin: Secondary | ICD-10-CM | POA: Diagnosis not present

## 2019-10-01 ENCOUNTER — Encounter: Payer: Self-pay | Admitting: Family Medicine

## 2019-10-01 ENCOUNTER — Ambulatory Visit (INDEPENDENT_AMBULATORY_CARE_PROVIDER_SITE_OTHER): Payer: PPO | Admitting: Family Medicine

## 2019-10-01 ENCOUNTER — Other Ambulatory Visit: Payer: Self-pay

## 2019-10-01 ENCOUNTER — Ambulatory Visit (HOSPITAL_BASED_OUTPATIENT_CLINIC_OR_DEPARTMENT_OTHER)
Admission: RE | Admit: 2019-10-01 | Discharge: 2019-10-01 | Disposition: A | Discharge status: Home / Self Care | Payer: PPO | Source: Ambulatory Visit | Attending: Family Medicine | Admitting: Family Medicine

## 2019-10-01 VITALS — BP 136/60 | HR 68 | Temp 97.2°F | Resp 18 | Ht 70.0 in | Wt 189.2 lb

## 2019-10-01 DIAGNOSIS — M25561 Pain in right knee: Secondary | ICD-10-CM

## 2019-10-01 DIAGNOSIS — E785 Hyperlipidemia, unspecified: Secondary | ICD-10-CM

## 2019-10-01 LAB — COMPREHENSIVE METABOLIC PANEL
ALT: 11 U/L (ref 0–53)
AST: 15 U/L (ref 0–37)
Albumin: 3.4 g/dL — ABNORMAL LOW (ref 3.5–5.2)
Alkaline Phosphatase: 71 U/L (ref 39–117)
BUN: 17 mg/dL (ref 6–23)
CO2: 29 mEq/L (ref 19–32)
Calcium: 8.3 mg/dL — ABNORMAL LOW (ref 8.4–10.5)
Chloride: 106 mEq/L (ref 96–112)
Creatinine, Ser: 0.78 mg/dL (ref 0.40–1.50)
GFR: 93.03 mL/min (ref >60.00)
Glucose, Bld: 86 mg/dL (ref 70–99)
Potassium: 3.6 mEq/L (ref 3.5–5.1)
Sodium: 140 mEq/L (ref 135–145)
Total Bilirubin: 0.6 mg/dL (ref 0.2–1.2)
Total Protein: 7.8 g/dL (ref 6.0–8.3)

## 2019-10-01 LAB — LIPID PANEL
Cholesterol: 105 mg/dL (ref 0–200)
HDL: 44.1 mg/dL (ref >39.00)
LDL Cholesterol: 44 mg/dL (ref 0–99)
NonHDL: 60.65
Total CHOL/HDL Ratio: 2
Triglycerides: 84 mg/dL (ref 0.0–149.0)
VLDL: 16.8 mg/dL (ref 0.0–40.0)

## 2019-10-01 MED ORDER — SIMVASTATIN 20 MG PO TABS: ORAL_TABLET | ORAL | 1 refills | Status: DC

## 2019-10-01 NOTE — Progress Notes (Signed)
Patient ID: Justin [name], male    DOB: 01/10/1925  Age: 86 y.o. MRN: [id number]    Subjective:  Subjective  HPI [name] presents for R knee pain after a fall during ice strorm last month.  Pain anteriorly and laterally.    Review of Systems  Constitutional: Negative.   HENT: Negative for congestion, ear pain, hearing loss, nosebleeds, postnasal drip, rhinorrhea, sinus pressure, sneezing and tinnitus.   Eyes: Negative for photophobia, discharge, itching and visual disturbance.  Respiratory: Negative.   Cardiovascular: Negative.   Gastrointestinal: Negative for abdominal distention, abdominal pain, anal bleeding, blood in stool and constipation.  Endocrine: Negative.   Genitourinary: Negative.   Musculoskeletal: Positive for gait problem and joint swelling.  Skin: Negative.   Allergic/Immunologic: Negative.   Neurological: Negative for dizziness, weakness, light-headedness, numbness and headaches.  Psychiatric/Behavioral: Negative for agitation, confusion, decreased concentration, dysphoric mood, sleep disturbance and suicidal ideas. The patient is not nervous/anxious.     History Past Medical History:  Diagnosis Date  . Anemia   . Basal cell cancer    Dr Wilhemina Bonito  . Benign prostatic hypertrophy    Dr Amalia Hailey  . Carpal tunnel syndrome 10/19/2015   Right  . Chronic kidney disease   . Colon polyp   . Diverticulosis   . Hyperlipidemia   . Kidney stones   . Shingles   . Stroke ([place])   . Transient ischemic attack    Dr Leonie Man    He has a past surgical history that includes Lithotripsy; Hemorrhoid surgery; Colonoscopy w/ polypectomy (01/2009); Cataract extraction, bilateral; and Prostate biopsy.   His family history includes Colon cancer in his mother; Colon polyps in his sister; Coronary artery disease in his brother; Diabetes in his brother and mother; Heart attack in his brother and another family member; Heart attack (age of onset: 28) in his father; Heart failure in  his father; Other in his sister; Prostate cancer in his brother; Stroke in his brother and brother.He reports that he quit smoking about 43 years ago. His smoking use included cigarettes, pipe, and cigars. He has quit using smokeless tobacco.  His smokeless tobacco use included chew. He reports that he does not drink alcohol or use drugs.  Current Outpatient Medications on File Prior to Visit  Medication Sig Dispense Refill  . aspirin 81 MG tablet Take 81 mg by mouth daily.    . finasteride (PROSCAR) 5 MG tablet Take 1 tablet (5 mg total) by mouth daily. 90 tablet 1   Current Facility-Administered Medications on File Prior to Visit  Medication Dose Route Frequency Provider Last Rate Last Admin  . cyanocobalamin ((VITAMIN B-12)) injection 1,000 mcg  1,000 mcg Intramuscular Once Carollee Herter, Kendrick Fries R, DO         Objective:  Objective  Physical Exam Vitals and nursing note reviewed.  Constitutional:      General: He is sleeping.     Appearance: He is well-developed.  HENT:     Head: Normocephalic and atraumatic.  Eyes:     Pupils: Pupils are equal, round, and reactive to light.  Neck:     Thyroid: No thyromegaly.  Cardiovascular:     Rate and Rhythm: Normal rate and regular rhythm.     Heart sounds: No murmur.  Pulmonary:     Effort: Pulmonary effort is normal. No respiratory distress.     Breath sounds: Normal breath sounds. No wheezing or rales.  Chest:     Chest wall: No tenderness.  Musculoskeletal:  General: Tenderness and signs of injury present.     Cervical back: Normal range of motion and neck supple.  Skin:    General: Skin is warm and dry.  Neurological:     Mental Status: He is oriented to person, place, and time.  Psychiatric:        Behavior: Behavior normal.        Thought Content: Thought content normal.        Judgment: Judgment normal.    BP 136/60 (BP Location: Left Arm, Patient Position: Sitting, Cuff Size: Normal)   Pulse 68   Temp (!) 97.2 F  (36.2 C) (Temporal)   Resp 18   Ht 5\' 10"  (1.778 m)   Wt 189 lb 3.2 oz (85.8 kg)   SpO2 95%   BMI 27.15 kg/m  Wt Readings from Last 3 Encounters:  10/01/19 189 lb 3.2 oz (85.8 kg)  05/26/19 186 lb 12.8 oz (84.7 kg)  07/28/18 186 lb (84.4 kg)     Lab Results  Component Value Date   WBC 7.3 07/28/2018   HGB 12.0 (L) 07/28/2018   HCT 34.4 (L) 07/28/2018   PLT 196.0 07/28/2018   GLUCOSE 86 10/01/2019   CHOL 105 10/01/2019   TRIG 84.0 10/01/2019   HDL 44.10 10/01/2019   LDLCALC 44 10/01/2019   ALT 11 10/01/2019   AST 15 10/01/2019   NA 140 10/01/2019   K 3.6 10/01/2019   CL 106 10/01/2019   CREATININE 0.78 10/01/2019   BUN 17 10/01/2019   CO2 29 10/01/2019   TSH 3.98 10/06/2018   PSA 1.32 07/28/2018   HGBA1C 5.4 01/10/2016   MICROALBUR 1.3 01/10/2016    DG Lumbar Spine Complete  Result Date: 05/26/2019 CLINICAL DATA:  Lower back pain, status post fall EXAM: LUMBAR SPINE - COMPLETE 4+ VIEW COMPARISON:  None. FINDINGS: Is a dextroconvex scoliotic curvature of the lumbar spine. No fracture or acute malalignment is. There is diffuse osteopenia. Disc height loss and facet arthrosis most notable at L5-S1. Calcifications seen within the right lower pelvis as on prior CT, likely a renal calcification within the pelvic kidney. Scattered vascular calcifications are noted. IMPRESSION: No acute fracture or malalignment. Dextroconvex scoliotic curvature. Lumbar spine spondylosis most notable at L5-S1. Electronically Signed   By: Prudencio Pair M.D.   On: 05/26/2019 10:40   DG Sacrum/Coccyx  Result Date: 05/26/2019 CLINICAL DATA:  Lower back pain, status post fall EXAM: SACRUM AND COCCYX - 2+ VIEW COMPARISON:  None. FINDINGS: Is diffuse osteopenia. Degenerative changes in the lower lumbar spine. There is no evidence of fracture or other focal bone lesions. IMPRESSION: No acute osseous abnormality. Electronically Signed   By: Prudencio Pair M.D.   On: 05/26/2019 10:32     Assessment & Plan:    Plan  I have discontinued Nkrumah Mcclard's Ciprofloxacin HCl. I have also changed his simvastatin. Additionally, I am having him maintain his aspirin and finasteride. We will continue to administer cyanocobalamin.  Meds ordered this encounter  Medications  . simvastatin (ZOCOR) 20 MG tablet    Sig: 1 po qd    Dispense:  90 tablet    Refill:  1    Problem List Items Addressed This Visit      Unprioritized   Acute pain of right knee - Primary    Ice , rest  Xray today tylenol prn       Relevant Orders   DG Knee Complete 4 Views Right   Hyperlipidemia    Encouraged  heart healthy diet, increase exercise, avoid trans fats, consider a krill oil cap daily      Relevant Medications   simvastatin (ZOCOR) 20 MG tablet   Other Relevant Orders   Lipid panel (Completed)   Comprehensive metabolic panel (Completed)      Follow-up: Return in about 6 months (around 04/02/2020), or if symptoms worsen or fail to improve, for annual exam, fasting.  [name], DO

## 2019-10-01 NOTE — Patient Instructions (Signed)
Acute Knee Pain, Adult Acute knee pain is sudden and may be caused by damage, swelling, or irritation of the muscles and tissues that support your knee. The injury may result from:  A fall.  An injury to your knee from twisting motions.  A hit to the knee.  Infection. Acute knee pain may go away on its own with time and rest. If it does not, your health care provider may order tests to find the cause of the pain. These may include:  Imaging tests, such as an X-ray, MRI, or ultrasound.  Joint aspiration. In this test, fluid is removed from the knee.  Arthroscopy. In this test, a lighted tube is inserted into the knee and an image is projected onto a TV screen.  Biopsy. In this test, a sample of tissue is removed from the body and studied under a microscope. Follow these instructions at home: Pay attention to any changes in your symptoms. Take these actions to relieve your pain. If you have a knee sleeve or brace:   Wear the sleeve or brace as told by your health care provider. Remove it only as told by your health care provider.  Loosen the sleeve or brace if your toes tingle, become numb, or turn cold and blue.  Keep the sleeve or brace clean.  If the sleeve or brace is not waterproof: ? Do not let it get wet. ? Cover it with a watertight covering when you take a bath or shower. Activity  Rest your knee.  Do not do things that cause pain or make pain worse.  Avoid high-impact activities or exercises, such as running, jumping rope, or doing jumping jacks.  Work with a physical therapist to make a safe exercise program, as recommended by your health care provider. Do exercises as told by your physical therapist. Managing pain, stiffness, and swelling   If directed, put ice on the knee: ? Put ice in a plastic bag. ? Place a towel between your skin and the bag. ? Leave the ice on for 20 minutes, 2-3 times a day.  If directed, use an elastic bandage to put pressure  (compression) on your injured knee. This may control swelling, give support, and help with discomfort. General instructions  Take over-the-counter and prescription medicines only as told by your health care provider.  Raise (elevate) your knee above the level of your heart when you are sitting or lying down.  Sleep with a pillow under your knee.  Do not use any products that contain nicotine or tobacco, such as cigarettes, e-cigarettes, and chewing tobacco. These can delay healing. If you need help quitting, ask your health care provider.  If you are overweight, work with your health care provider and a dietitian to set a weight-loss goal that is healthy and reasonable for you. Extra weight can put pressure on your knee.  Keep all follow-up visits as told by your health care provider. This is important. Contact a health care provider if:  Your knee pain continues, changes, or gets worse.  You have a fever along with knee pain.  Your knee feels warm to the touch.  Your knee buckles or locks up. Get help right away if:  Your knee swells, and the swelling becomes worse.  You cannot move your knee.  You have severe pain in your knee. Summary  Acute knee pain can be caused by a fall, an injury, an infection, or damage, swelling, or irritation of the tissues that support your knee.  Your health care provider may perform tests to find out the cause of the pain.  Pay attention to any changes in your symptoms. Relieve your pain with rest, medicines, light activity, and use of ice.  Get help if your pain continues or becomes worse, your knee swells, or you cannot move your knee. This information is not intended to replace advice given to you by your health care provider. Make sure you discuss any questions you have with your health care provider. Document Revised: 12/19/2017 Document Reviewed: 12/19/2017 Elsevier Patient Education  [place].

## 2019-10-01 NOTE — Assessment & Plan Note (Signed)
Encouraged heart healthy diet, increase exercise, avoid trans fats, consider a krill oil cap daily 

## 2019-10-01 NOTE — Assessment & Plan Note (Signed)
Ice , rest  Xray today tylenol prn

## 2019-10-08 ENCOUNTER — Ambulatory Visit (INDEPENDENT_AMBULATORY_CARE_PROVIDER_SITE_OTHER): Payer: PPO

## 2019-10-08 ENCOUNTER — Other Ambulatory Visit: Payer: Self-pay

## 2019-10-08 DIAGNOSIS — E538 Deficiency of other specified B group vitamins: Secondary | ICD-10-CM | POA: Diagnosis not present

## 2019-10-08 MED ORDER — CYANOCOBALAMIN 1000 MCG/ML IJ SOLN
1000.0000 ug | Freq: Once | INTRAMUSCULAR | Status: AC
  Administered 2019-10-08: 1000 ug via INTRAMUSCULAR

## 2019-10-08 NOTE — Progress Notes (Signed)
Patient here for cyanocobalamin 1000 mg/ml injection due to Vitamin B12 deficiency. Injection administered w/o any complications.

## 2019-10-08 NOTE — Progress Notes (Signed)
Noted  Justin [name] Justin [name] Chase, DO  

## 2019-10-26 DIAGNOSIS — H61002 Unspecified perichondritis of left external ear: Secondary | ICD-10-CM | POA: Diagnosis not present

## 2019-10-26 DIAGNOSIS — R208 Other disturbances of skin sensation: Secondary | ICD-10-CM | POA: Diagnosis not present

## 2019-10-26 DIAGNOSIS — L820 Inflamed seborrheic keratosis: Secondary | ICD-10-CM | POA: Diagnosis not present

## 2019-11-10 ENCOUNTER — Other Ambulatory Visit: Payer: Self-pay

## 2019-11-10 ENCOUNTER — Ambulatory Visit (INDEPENDENT_AMBULATORY_CARE_PROVIDER_SITE_OTHER): Payer: PPO

## 2019-11-10 DIAGNOSIS — E538 Deficiency of other specified B group vitamins: Secondary | ICD-10-CM

## 2019-11-10 MED ORDER — CYANOCOBALAMIN 1000 MCG/ML IJ SOLN
1000.0000 ug | Freq: Once | INTRAMUSCULAR | Status: AC
  Administered 2019-11-10: 1000 ug via INTRAMUSCULAR

## 2019-11-10 NOTE — Progress Notes (Addendum)
Pt here for monthly B12 injection per Dr. Etter Sjogren  B12 1068mcg given IM, and pt tolerated injection well.  Next B12 injection scheduled for next month    Reviewed   [name], DO

## 2019-12-08 ENCOUNTER — Ambulatory Visit (INDEPENDENT_AMBULATORY_CARE_PROVIDER_SITE_OTHER): Payer: PPO

## 2019-12-08 ENCOUNTER — Other Ambulatory Visit: Payer: Self-pay

## 2019-12-08 DIAGNOSIS — E538 Deficiency of other specified B group vitamins: Secondary | ICD-10-CM

## 2019-12-08 MED ORDER — CYANOCOBALAMIN 1000 MCG/ML IJ SOLN
1000.0000 ug | Freq: Once | INTRAMUSCULAR | Status: AC
  Administered 2019-12-08: 1000 ug via INTRAMUSCULAR

## 2019-12-08 NOTE — Progress Notes (Signed)
Pt here for monthly B12 injection per   B12 1042mcg given IM, and pt tolerated injection well.  Next B12 injection scheduled for  January 08, 2020 at 10 AM.

## 2020-01-08 ENCOUNTER — Ambulatory Visit (INDEPENDENT_AMBULATORY_CARE_PROVIDER_SITE_OTHER): Payer: PPO

## 2020-01-08 ENCOUNTER — Other Ambulatory Visit: Payer: Self-pay

## 2020-01-08 DIAGNOSIS — E538 Deficiency of other specified B group vitamins: Secondary | ICD-10-CM

## 2020-01-08 MED ORDER — CYANOCOBALAMIN 1000 MCG/ML IJ SOLN
1000.0000 ug | Freq: Once | INTRAMUSCULAR | Status: AC
  Administered 2020-01-08: 1000 ug via INTRAMUSCULAR

## 2020-01-08 NOTE — Progress Notes (Signed)
Pt here for monthly B12 injection per Dr. Lowne  B12 1000mcg given IM in left deltoid, and pt tolerated injection well.  Next B12 injection scheduled for next month.  

## 2020-01-08 NOTE — Progress Notes (Signed)
Noted  Riki Gehring R Lowne Chase, DO  

## 2020-02-08 NOTE — Progress Notes (Addendum)
Subjective:   Justin [name] is a 86 y.o. male who presents for Medicare Annual/Subsequent preventive examination.  Review of Systems    Cardiac Risk Factors include: advanced age (>66men, >48 women);dyslipidemia     Objective:    Today's Vitals   02/09/20 1104  BP: 136/70  Pulse: 69  Temp: (!) 97 F (36.1 C)  TempSrc: Temporal  SpO2: 97%  Weight: 187 lb (84.8 kg)  Height: 5\' 10"  (1.778 m)   Body mass index is 26.83 kg/m.  Advanced Directives 02/09/2020 07/28/2018 06/15/2016 06/08/2016  Does Patient Have a Medical Advance Directive? No No No No  Would patient like information on creating a medical advance directive? No - Patient declined No - Patient declined - Yes - Educational materials given    Current Medications (verified) Outpatient Encounter Medications as of 02/09/2020  Medication Sig   aspirin 81 MG tablet Take 81 mg by mouth daily.   finasteride (PROSCAR) 5 MG tablet Take 1 tablet (5 mg total) by mouth daily.   simvastatin (ZOCOR) 20 MG tablet 1 po qd   Facility-Administered Encounter Medications as of 02/09/2020  Medication   cyanocobalamin ((VITAMIN B-12)) injection 1,000 mcg    Allergies (verified) Prednisone, Inderal [propranolol], and Omeprazole   History: Past Medical History:  Diagnosis Date   Anemia    Basal cell cancer    Dr Wilhemina Bonito   Benign prostatic hypertrophy    Dr Amalia Hailey   Carpal tunnel syndrome 10/19/2015   Right   Chronic kidney disease    Colon polyp    Diverticulosis    Hyperlipidemia    Kidney stones    Shingles    Stroke ([place])    Transient ischemic attack    Dr Leonie Man   Past Surgical History:  Procedure Laterality Date   CATARACT EXTRACTION, BILATERAL     COLONOSCOPY W/ POLYPECTOMY  01/2009   adenomatous, Dr. Alben Spittle   HEMORRHOID SURGERY     LITHOTRIPSY      X 2;Dr. Amalia Hailey   PROSTATE BIOPSY     X2   Family History  Problem Relation Age of Onset   Heart failure Father    Heart attack Father 62   Diabetes Mother     Colon cancer Mother    Prostate cancer Brother    Stroke Brother    Coronary artery disease Brother        7 vessel CBAG   Diabetes Brother    Heart attack Other        2 P uncles , both > 74   Stroke Brother        in late 70s   Colon polyps Sister    Heart attack Brother    Other Sister        brain hemorrhage due to a fall   Social History   Socioeconomic History   Marital status: Married    Spouse name: Not on file   Number of children: 4   Years of education: Not on file   Highest education level: Not on file  Occupational History   Occupation: Truck Education administrator: [employer]  Tobacco Use   Smoking status: Former Smoker    Types: Cigarettes, Pipe, Cigars    Quit date: 07/23/1976    Years since quitting: 43.5   Smokeless tobacco: Former Systems developer    Types: Chew   Tobacco comment: smoked [phone], up to < 1 ppd  Substance and Sexual Activity   Alcohol use:  No    Alcohol/week: 1.0 standard drink    Types: 1 Glasses of wine per week    Comment: rare wine   Drug use: No   Sexual activity: Not on file  Other Topics Concern   Not on file  Social History Narrative   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, 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32   Social Determinants of Radio broadcast assistant Strain: Low Risk    Difficulty of Paying Living Expenses: Not hard at all  Food Insecurity: No Food Insecurity   Worried About Charity fundraiser in the Last Year: Never true   Arboriculturist in the Last Year: Never true  Transportation Needs: No Transportation Needs   Lack of Transportation (Medical): No   Lack of Transportation (Non-Medical): No  Physical Activity:    Days of Exercise per Week:    Minutes of Exercise per Session:   Stress:    Feeling of Stress :   Social Connections:    Frequency of Communication with Friends and Family:    Frequency of Social Gatherings with Friends and Family:    Attends  Religious Services:    Active Member of Clubs or Organizations:    Attends Archivist Meetings:    Marital Status:     Tobacco Counseling Counseling given: Not Answered Comment: smoked [phone], up to < 1 ppd   Clinical Intake:  Pain : No/denies pain    Activities of Daily Living In your present state of health, do you have any difficulty performing the following activities: 02/09/2020  Hearing? N  Vision? N  Difficulty concentrating or making decisions? N  Walking or climbing stairs? N  Dressing or bathing? N  Doing errands, shopping? N  Preparing Food and eating ? N  Using the Toilet? N  In the past six months, have you accidently leaked urine? N  Do you have problems with loss of bowel control? N  Managing your Medications? N  Managing your Finances? N  Housekeeping or managing your Housekeeping? N  Some recent data might be hidden    Patient Care Team: Carollee Herter, Alferd Apa, DO as PCP - General (Family Medicine) Domingo Pulse, MD (Urology) Rutherford Guys, MD as Consulting Physician (Ophthalmology) Macario Carls, MD as Referring Physician (Dermatology)  Indicate any recent Medical Services you may have received from other than [facility] providers in the past year (date may be approximate).     Assessment:   This is a routine wellness examination for Justin [name].  Dietary issues and exercise activities discussed:   Diet (meal preparation, eat out, water intake, caffeinated beverages, dairy products, fruits and vegetables): well balanced   Goals      DIET - INCREASE WATER INTAKE       Depression Screen PHQ 2/9 Scores 02/09/2020 07/28/2018 10/26/2016 12/09/2015 02/28/2015 02/25/2015 06/12/2013  PHQ - 2 Score 0 0 0 0 0 0 0  Exception Documentation - - - Patient refusal - - -    Fall Risk Fall Risk  02/09/2020 10/01/2019 07/28/2018 10/26/2016 12/09/2015  Falls in the past year? 0 1 0 Yes Yes  Number falls in past yr: 0 0 - 1 1  Injury with Fall? 0 0 - Yes Yes    Comment - - - pt report he tripped over a curb at [place], pt report he injuried by knees -  Risk Factor Category  - - - -  High Fall Risk  Risk for fall due to : - - - - Impaired balance/gait  Follow up Education provided;Falls prevention discussed Falls evaluation completed - - Falls prevention discussed   Lives w/ wife in 2 story home. Son lives directly behind them. Any stairs in or around the home? Yes  If so, are there any without handrails? No  Home free of loose throw rugs in walkways, pet beds, electrical cords, etc? Yes  Adequate lighting in your home to reduce risk of falls? Yes   ASSISTIVE DEVICES UTILIZED TO PREVENT FALLS:  Life alert? No  Use of a cane, walker or w/c? No  Grab bars in the bathroom? Yes  Shower chair or bench in shower? No  Elevated toilet seat or a handicapped toilet? No   TIMED UP AND GO:  Was the test performed? No .  Gait steady and fast without use of assistive device  Cognitive Function:  MMSE - Mini Mental State Exam 07/28/2018  Orientation to time 5  Orientation to Place 5  Registration 3  Attention/ Calculation 5  Recall 2  Language- name 2 objects 2  Language- repeat 1  Language- follow 3 step command 3  Language- read & follow direction 1  Write a sentence 1  Copy design 1  Total score 29     6CIT Screen 02/09/2020  What Year? 0 points  What month? 0 points  What time? 0 points  Count back from 20 0 points  Months in reverse 0 points  Repeat phrase 2 points  Total Score 2    Immunizations Immunization History  Administered Date(s) Administered   Fluad Quad(high Dose 65+) 05/22/2019   Influenza Split 05/25/2011, 05/23/2012   Influenza Whole 05/27/2007, 05/26/2008, 06/13/2009, 05/23/2010   Influenza, High Dose Seasonal PF 05/25/2015, 06/08/2016, 04/26/2017, 05/28/2018   Influenza, Seasonal, Injecte, Preservative Fre 05/25/2013   Influenza,inj,Quad PF,6+ Mos 05/10/2014   PFIZER SARS-COV-2 Vaccination 08/13/2019, 09/03/2019    Td 08/13/2002   Tdap 12/03/2012    TDAP status: Up to date Flu Vaccine status: Up to date Covid-19 vaccine status: Completed vaccines  Screening Tests Health Maintenance  Topic Date Due   PNA vac Low Risk Adult (1 of 2 - PCV13) 05/25/2020 (Originally 06/13/1992)   INFLUENZA VACCINE  02/21/2020   TETANUS/TDAP  12/04/2022   COVID-19 Vaccine  Completed    Health Maintenance  There are no preventive care reminders to display for this patient.  Colorectal cancer screening: No longer required.   Lung Cancer Screening: (Low Dose CT Chest recommended if Age 59-80 years, 30 pack-year currently smoking OR have quit w/in 15years.) does not qualify.    Additional Screening:   Vision Screening: Recommended annual ophthalmology exams for early detection of glaucoma and other disorders of the eye. Is the patient up to date with their annual eye exam?  Yes  Who is the provider or what is the name of the office in which the patient attends annual eye exams? [facility]   Dental Screening: Recommended annual dental exams for proper oral hygiene  Community Resource Referral / Chronic Care Management: CRR required this visit?  No   CCM required this visit?  No      Plan:   See you next year!  Continue to eat heart healthy diet (full of fruits, vegetables, whole grains, lean protein, water--limit salt, fat, and sugar intake) and increase physical activity as tolerated.  Continue doing brain stimulating activities (puzzles, reading, adult coloring books, staying active) to keep memory  sharp.     I have personally reviewed and noted the following in the patient's chart:   Medical and social history Use of alcohol, tobacco or illicit drugs  Current medications and supplements Functional ability and status Nutritional status Physical activity Advanced directives List of other physicians Hospitalizations, surgeries, and ER visits in previous 12 months Vitals Screenings to include  cognitive, depression, and falls Referrals and appointments  In addition, I have reviewed and discussed with patient certain preventive protocols, quality metrics, and best practice recommendations. A written personalized care plan for preventive services as well as general preventive health recommendations were provided to patient.     Shela Nevin, RN   02/09/2020    Reviewed [name], DO

## 2020-02-09 ENCOUNTER — Other Ambulatory Visit: Payer: Self-pay

## 2020-02-09 ENCOUNTER — Ambulatory Visit (INDEPENDENT_AMBULATORY_CARE_PROVIDER_SITE_OTHER): Payer: PPO | Admitting: *Deleted

## 2020-02-09 ENCOUNTER — Ambulatory Visit (INDEPENDENT_AMBULATORY_CARE_PROVIDER_SITE_OTHER): Payer: PPO

## 2020-02-09 ENCOUNTER — Encounter: Payer: Self-pay | Admitting: *Deleted

## 2020-02-09 VITALS — BP 136/70 | HR 69 | Temp 97.0°F | Ht 70.0 in | Wt 187.0 lb

## 2020-02-09 DIAGNOSIS — Z0000 Encounter for general adult medical examination without abnormal findings: Secondary | ICD-10-CM | POA: Diagnosis not present

## 2020-02-09 DIAGNOSIS — E538 Deficiency of other specified B group vitamins: Secondary | ICD-10-CM

## 2020-02-09 MED ORDER — CYANOCOBALAMIN 1000 MCG/ML IJ SOLN
1000.0000 ug | Freq: Once | INTRAMUSCULAR | Status: AC
  Administered 2020-02-09: 1000 ug via INTRAMUSCULAR

## 2020-02-09 NOTE — Progress Notes (Signed)
Reviewed  Ercel Pepitone R Lowne Chase, DO  

## 2020-02-09 NOTE — Patient Instructions (Signed)
See you next year!  Continue to eat heart healthy diet (full of fruits, vegetables, whole grains, lean protein, water--limit salt, fat, and sugar intake) and increase physical activity as tolerated.  Continue doing brain stimulating activities (puzzles, reading, adult coloring books, staying active) to keep memory sharp.    Justin [name] , Thank you for taking time to come for your Medicare Wellness Visit. I appreciate your ongoing commitment to your health goals. Please review the following plan we discussed and let me know if I can assist you in the future.   These are the goals we discussed: Goals    . DIET - INCREASE WATER INTAKE       This is a list of the screening recommended for you and due dates:  Health Maintenance  Topic Date Due  . Pneumonia vaccines (1 of 2 - PCV13) 05/25/2020*  . Flu Shot  02/21/2020  . Tetanus Vaccine  12/04/2022  . COVID-19 Vaccine  Completed  *Topic was postponed. The date shown is not the original due date.    Preventive Care 29 Years and Older, Male Preventive care refers to lifestyle choices and visits with your health care provider that can promote health and wellness. This includes:  A yearly physical exam. This is also called an annual well check.  Regular dental and eye exams.  Immunizations.  Screening for certain conditions.  Healthy lifestyle choices, such as diet and exercise. What can I expect for my preventive care visit? Physical exam Your health care provider will check:  Height and weight. These may be used to calculate body mass index (BMI), which is a measurement that tells if you are at a healthy weight.  Heart rate and blood pressure.  Your skin for abnormal spots. Counseling Your health care provider may ask you questions about:  Alcohol, tobacco, and drug use.  Emotional well-being.  Home and relationship well-being.  Sexual activity.  Eating habits.  History of falls.  Memory and ability to understand  (cognition).  Work and work Statistician. What immunizations do I need?  Influenza (flu) vaccine  This is recommended every year. Tetanus, diphtheria, and pertussis (Tdap) vaccine  You may need a Td booster every 10 years. Varicella (chickenpox) vaccine  You may need this vaccine if you have not already been vaccinated. Zoster (shingles) vaccine  You may need this after age 99. Pneumococcal conjugate (PCV13) vaccine  One dose is recommended after age 43. Pneumococcal polysaccharide (PPSV23) vaccine  One dose is recommended after age 27. Measles, mumps, and rubella (MMR) vaccine  You may need at least one dose of MMR if you were born in 1957 or later. You may also need a second dose. Meningococcal conjugate (MenACWY) vaccine  You may need this if you have certain conditions. Hepatitis A vaccine  You may need this if you have certain conditions or if you travel or work in places where you may be exposed to hepatitis A. Hepatitis B vaccine  You may need this if you have certain conditions or if you travel or work in places where you may be exposed to hepatitis B. Haemophilus influenzae type b (Hib) vaccine  You may need this if you have certain conditions. You may receive vaccines as individual doses or as more than one vaccine together in one shot (combination vaccines). Talk with your health care provider about the risks and benefits of combination vaccines. What tests do I need? Blood tests  Lipid and cholesterol levels. These may be checked every 5  years, or more frequently depending on your overall health.  Hepatitis C test.  Hepatitis B test. Screening  Lung cancer screening. You may have this screening every year starting at age 4 if you have a 30-pack-year history of smoking and currently smoke or have quit within the past 15 years.  Colorectal cancer screening. All adults should have this screening starting at age 3 and continuing until age 44. Your health  care provider may recommend screening at age 84 if you are at increased risk. You will have tests every 1-10 years, depending on your results and the type of screening test.  Prostate cancer screening. Recommendations will vary depending on your family history and other risks.  Diabetes screening. This is done by checking your blood sugar (glucose) after you have not eaten for a while (fasting). You may have this done every 1-3 years.  Abdominal aortic aneurysm (AAA) screening. You may need this if you are a current or former smoker.  Sexually transmitted disease (STD) testing. Follow these instructions at home: Eating and drinking  Eat a diet that includes fresh fruits and vegetables, whole grains, lean protein, and low-fat dairy products. Limit your intake of foods with high amounts of sugar, saturated fats, and salt.  Take vitamin and mineral supplements as recommended by your health care provider.  Do not drink alcohol if your health care provider tells you not to drink.  If you drink alcohol: ? Limit how much you have to 0-2 drinks a day. ? Be aware of how much alcohol is in your drink. In the [place] one drink equals one 12 oz bottle of beer (355 mL), one 5 oz glass of wine (148 mL), or one 1 oz glass of hard liquor (44 mL). Lifestyle  Take daily care of your teeth and gums.  Stay active. Exercise for at least 30 minutes on 5 or more days each week.  Do not use any products that contain nicotine or tobacco, such as cigarettes, e-cigarettes, and chewing tobacco. If you need help quitting, ask your health care provider.  If you are sexually active, practice safe sex. Use a condom or other form of protection to prevent STIs (sexually transmitted infections).  Talk with your health care provider about taking a low-dose aspirin or statin. What's next?  Visit your health care provider once a year for a well check visit.  Ask your health care provider how often you should have your  eyes and teeth checked.  Stay up to date on all vaccines. This information is not intended to replace advice given to you by your health care provider. Make sure you discuss any questions you have with your health care provider. Document Revised: 07/03/2018 Document Reviewed: 07/03/2018 Elsevier Patient Education  2020 [employer].

## 2020-02-09 NOTE — Progress Notes (Signed)
Pt here for monthly B12 injection per Dr. Etter Sjogren  B12 1075mcg given IM in right deltoid, and pt tolerated injection well.  Next B12 injection scheduled for next month.

## 2020-03-08 DIAGNOSIS — Z8042 Family history of malignant neoplasm of prostate: Secondary | ICD-10-CM | POA: Diagnosis not present

## 2020-03-08 DIAGNOSIS — N401 Enlarged prostate with lower urinary tract symptoms: Secondary | ICD-10-CM | POA: Diagnosis not present

## 2020-03-08 DIAGNOSIS — N209 Urinary calculus, unspecified: Secondary | ICD-10-CM | POA: Diagnosis not present

## 2020-03-15 ENCOUNTER — Ambulatory Visit: Payer: PPO

## 2020-04-04 ENCOUNTER — Encounter: Payer: PPO | Admitting: Family Medicine

## 2020-04-06 ENCOUNTER — Other Ambulatory Visit: Payer: Self-pay

## 2020-04-06 ENCOUNTER — Ambulatory Visit (INDEPENDENT_AMBULATORY_CARE_PROVIDER_SITE_OTHER): Payer: PPO

## 2020-04-06 DIAGNOSIS — E538 Deficiency of other specified B group vitamins: Secondary | ICD-10-CM | POA: Diagnosis not present

## 2020-04-06 MED ORDER — CYANOCOBALAMIN 1000 MCG/ML IJ SOLN
1000.0000 ug | Freq: Once | INTRAMUSCULAR | Status: AC
  Administered 2020-04-06: 1000 ug via INTRAMUSCULAR

## 2020-04-06 NOTE — Progress Notes (Signed)
Patient here today for monthly  B12 1,000 mcg dose, per Dr. Lowne.   Injection given in Left Deltoid, and patient tolerated well.  One month appointment scheduled.  

## 2020-04-21 ENCOUNTER — Ambulatory Visit (INDEPENDENT_AMBULATORY_CARE_PROVIDER_SITE_OTHER): Payer: PPO | Admitting: Family Medicine

## 2020-04-21 ENCOUNTER — Other Ambulatory Visit: Payer: Self-pay

## 2020-04-21 ENCOUNTER — Encounter: Payer: Self-pay | Admitting: Family Medicine

## 2020-04-21 VITALS — BP 128/80 | HR 68 | Temp 97.7°F | Resp 20 | Ht 70.0 in | Wt 187.0 lb

## 2020-04-21 DIAGNOSIS — R300 Dysuria: Secondary | ICD-10-CM

## 2020-04-21 DIAGNOSIS — E785 Hyperlipidemia, unspecified: Secondary | ICD-10-CM

## 2020-04-21 DIAGNOSIS — N401 Enlarged prostate with lower urinary tract symptoms: Secondary | ICD-10-CM | POA: Diagnosis not present

## 2020-04-21 DIAGNOSIS — M25552 Pain in left hip: Secondary | ICD-10-CM | POA: Diagnosis not present

## 2020-04-21 DIAGNOSIS — N138 Other obstructive and reflux uropathy: Secondary | ICD-10-CM | POA: Diagnosis not present

## 2020-04-21 LAB — POC URINALSYSI DIPSTICK (AUTOMATED)
Bilirubin, UA: NEGATIVE
Blood, UA: NEGATIVE
Glucose, UA: NEGATIVE
Ketones, UA: NEGATIVE
Leukocytes, UA: NEGATIVE
Nitrite, UA: NEGATIVE
Protein, UA: NEGATIVE
Spec Grav, UA: 1.015 (ref 1.010–1.025)
Urobilinogen, UA: 0.2 E.U./dL
pH, UA: 6.5 (ref 5.0–8.0)

## 2020-04-21 MED ORDER — SIMVASTATIN 20 MG PO TABS: ORAL_TABLET | ORAL | 1 refills | Status: DC

## 2020-04-21 MED ORDER — FINASTERIDE 5 MG PO TABS: 5.0000 mg | ORAL_TABLET | Freq: Every day | ORAL | 1 refills | Status: DC

## 2020-04-21 NOTE — Assessment & Plan Note (Signed)
Improved  con't cosaquin  Can take tylenol prn  rto prn

## 2020-04-21 NOTE — Progress Notes (Signed)
Patient ID: Justin [name], male    DOB: Dec 26, 1924  Age: 86 y.o. MRN: [id number]    Subjective:  Subjective  HPI Justin [name] presents for L hip pain and low back----- x 3 weeks    No known injury.   He started taking cosaquin and he feels better=--- it is no longer bothering him.  He would like his urine checked  Review of Systems  Constitutional: Negative for appetite change, diaphoresis, fatigue and unexpected weight change.  Eyes: Negative for pain, redness and visual disturbance.  Respiratory: Negative for cough, chest tightness, shortness of breath and wheezing.   Cardiovascular: Negative for chest pain, palpitations and leg swelling.  Endocrine: Negative for cold intolerance, heat intolerance, polydipsia, polyphagia and polyuria.  Genitourinary: Negative for decreased urine volume, difficulty urinating, dysuria, flank pain, frequency and urgency.  Musculoskeletal: Negative.   Neurological: Negative for dizziness, light-headedness, numbness and headaches.    History Past Medical History:  Diagnosis Date  . Anemia   . Basal cell cancer    Dr Justin [name]  . Benign prostatic hypertrophy    Dr Justin [name]  . Carpal tunnel syndrome 10/19/2015   Right  . Chronic kidney disease   . Colon polyp   . Diverticulosis   . Hyperlipidemia   . Kidney stones   . Shingles   . Stroke ([place])   . Transient ischemic attack    Dr Justin [name]    He has a past surgical history that includes Lithotripsy; Hemorrhoid surgery; Colonoscopy w/ polypectomy (01/2009); Cataract extraction, bilateral; and Prostate biopsy.   His family history includes Colon cancer in his mother; Colon polyps in his sister; Coronary artery disease in his brother; Diabetes in his brother and mother; Heart attack in his brother and another family member; Heart attack (age of onset: 45) in his father; Heart failure in his father; Other in his sister; Prostate cancer in his brother; Stroke in his brother and brother.He reports that he  quit smoking about 43 years ago. His smoking use included cigarettes, pipe, and cigars. He has quit using smokeless tobacco.  His smokeless tobacco use included chew. He reports that he does not drink alcohol and does not use drugs.  Current Outpatient Medications on File Prior to Visit  Medication Sig Dispense Refill  . aspirin 81 MG tablet Take 81 mg by mouth daily.     No current facility-administered medications on file prior to visit.     Objective:  Objective  Physical Exam Vitals and nursing note reviewed.  Constitutional:      General: He is sleeping.     Appearance: He is well-developed.  HENT:     Head: Normocephalic and atraumatic.  Eyes:     Pupils: Pupils are equal, round, and reactive to light.  Neck:     Thyroid: No thyromegaly.  Cardiovascular:     Rate and Rhythm: Normal rate and regular rhythm.     Heart sounds: No murmur heard.   Pulmonary:     Effort: Pulmonary effort is normal. No respiratory distress.     Breath sounds: Normal breath sounds. No wheezing or rales.  Chest:     Chest wall: No tenderness.  Musculoskeletal:        General: No swelling, tenderness or signs of injury. Normal range of motion.     Cervical back: Normal range of motion and neck supple.     Right lower leg: No edema.     Left lower leg: No edema.  Skin:  General: Skin is warm and dry.  Neurological:     Mental Status: He is oriented to person, place, and time.  Psychiatric:        Behavior: Behavior normal.        Thought Content: Thought content normal.        Judgment: Judgment normal.    BP 128/80 (BP Location: Left Arm, Patient Position: Sitting, Cuff Size: Normal)   Pulse 68   Temp 97.7 F (36.5 C) (Oral)   Resp 20   Ht 5\' 10"  (1.778 m)   Wt 187 lb (84.8 kg)   SpO2 98%   BMI 26.83 kg/m  Wt Readings from Last 3 Encounters:  04/21/20 187 lb (84.8 kg)  02/09/20 187 lb (84.8 kg)  10/01/19 189 lb 3.2 oz (85.8 kg)     Lab Results  Component Value Date   WBC  7.3 07/28/2018   HGB 12.0 (L) 07/28/2018   HCT 34.4 (L) 07/28/2018   PLT 196.0 07/28/2018   GLUCOSE 86 10/01/2019   CHOL 105 10/01/2019   TRIG 84.0 10/01/2019   HDL 44.10 10/01/2019   LDLCALC 44 10/01/2019   ALT 11 10/01/2019   AST 15 10/01/2019   NA 140 10/01/2019   K 3.6 10/01/2019   CL 106 10/01/2019   CREATININE 0.78 10/01/2019   BUN 17 10/01/2019   CO2 29 10/01/2019   TSH 3.98 10/06/2018   PSA 1.32 07/28/2018   HGBA1C 5.4 01/10/2016   MICROALBUR 1.3 01/10/2016    DG Knee Complete 4 Views Right  Result Date: 10/01/2019 CLINICAL DATA:  Fall on ice, right knee pain EXAM: RIGHT KNEE - COMPLETE 4+ VIEW COMPARISON:  None. FINDINGS: No evidence of fracture, dislocation, or joint effusion. No evidence of arthropathy or other focal bone abnormality. Soft tissue edema anteriorly. IMPRESSION: No fracture or dislocation of the right knee. Joint spaces are very well preserved for patient age. Soft tissue edema anteriorly. Electronically Signed   By: Justin [name] M.D.   On: 10/01/2019 14:08     Assessment & Plan:  Plan  I am having Justin [name] maintain his aspirin, finasteride, and simvastatin.  Meds ordered this encounter  Medications  . finasteride (PROSCAR) 5 MG tablet    Sig: Take 1 tablet (5 mg total) by mouth daily.    Dispense:  90 tablet    Refill:  1  . simvastatin (ZOCOR) 20 MG tablet    Sig: 1 po qd    Dispense:  90 tablet    Refill:  1    Problem List Items Addressed This Visit      Unprioritized   Benign prostatic hyperplasia with urinary obstruction   Relevant Medications   finasteride (PROSCAR) 5 MG tablet   Other Relevant Orders   PSA   Dysuria - Primary   Relevant Orders   POCT Urinalysis Dipstick (Automated) (Completed)   PSA   Hyperlipidemia LDL goal <100   Relevant Medications   simvastatin (ZOCOR) 20 MG tablet   Other Relevant Orders   Lipid panel   Comprehensive metabolic panel   Left hip pain    Improved  con't cosaquin  Can take  tylenol prn  rto prn          Follow-up: Return in about 3 months (around 07/21/2020), or if symptoms worsen or fail to improve.  Justin Held, DO

## 2020-04-21 NOTE — Patient Instructions (Signed)
Acute Back Pain, Adult Acute back pain is sudden and usually short-lived. It is often caused by an injury to the muscles and tissues in the back. The injury may result from:  A muscle or ligament getting overstretched or torn (strained). Ligaments are tissues that connect bones to each other. Lifting something improperly can cause a back strain.  Wear and tear (degeneration) of the spinal disks. Spinal disks are circular tissue that provides cushioning between the bones of the spine (vertebrae).  Twisting motions, such as while playing sports or doing yard work.  A hit to the back.  Arthritis. You may have a physical exam, lab tests, and imaging tests to find the cause of your pain. Acute back pain usually goes away with rest and home care. Follow these instructions at home: Managing pain, stiffness, and swelling  Take over-the-counter and prescription medicines only as told by your health care provider.  Your health care provider may recommend applying ice during the first 24-48 hours after your pain starts. To do this: ? Put ice in a plastic bag. ? Place a towel between your skin and the bag. ? Leave the ice on for 20 minutes, 2-3 times a day.  If directed, apply heat to the affected area as often as told by your health care provider. Use the heat source that your health care provider recommends, such as a moist heat pack or a heating pad. ? Place a towel between your skin and the heat source. ? Leave the heat on for 20-30 minutes. ? Remove the heat if your skin turns bright red. This is especially important if you are unable to feel pain, heat, or cold. You have a greater risk of getting burned. Activity   Do not stay in bed. Staying in bed for more than 1-2 days can delay your recovery.  Sit up and stand up straight. Avoid leaning forward when you sit, or hunching over when you stand. ? If you work at a desk, sit close to it so you do not need to lean over. Keep your chin tucked  in. Keep your neck drawn back, and keep your elbows bent at a right angle. Your arms should look like the letter "L." ? Sit high and close to the steering wheel when you drive. Add lower back (lumbar) support to your car seat, if needed.  Take short walks on even surfaces as soon as you are able. Try to increase the length of time you walk each day.  Do not sit, drive, or stand in one place for more than 30 minutes at a time. Sitting or standing for long periods of time can put stress on your back.  Do not drive or use heavy machinery while taking prescription pain medicine.  Use proper lifting techniques. When you bend and lift, use positions that put less stress on your back: ? Bend your knees. ? Keep the load close to your body. ? Avoid twisting.  Exercise regularly as told by your health care provider. Exercising helps your back heal faster and helps prevent back injuries by keeping muscles strong and flexible.  Work with a physical therapist to make a safe exercise program, as recommended by your health care provider. Do any exercises as told by your physical therapist. Lifestyle  Maintain a healthy weight. Extra weight puts stress on your back and makes it difficult to have good posture.  Avoid activities or situations that make you feel anxious or stressed. Stress and anxiety increase muscle   tension and can make back pain worse. Learn ways to manage anxiety and stress, such as through exercise. General instructions  Sleep on a firm mattress in a comfortable position. Try lying on your side with your knees slightly bent. If you lie on your back, put a pillow under your knees.  Follow your treatment plan as told by your health care provider. This may include: ? Cognitive or behavioral therapy. ? Acupuncture or massage therapy. ? Meditation or yoga. Contact a health care provider if:  You have pain that is not relieved with rest or medicine.  You have increasing pain going down  into your legs or buttocks.  Your pain does not improve after 2 weeks.  You have pain at night.  You lose weight without trying.  You have a fever or chills. Get help right away if:  You develop new bowel or bladder control problems.  You have unusual weakness or numbness in your arms or legs.  You develop nausea or vomiting.  You develop abdominal pain.  You feel faint. Summary  Acute back pain is sudden and usually short-lived.  Use proper lifting techniques. When you bend and lift, use positions that put less stress on your back.  Take over-the-counter and prescription medicines and apply heat or ice as directed by your health care provider. This information is not intended to replace advice given to you by your health care provider. Make sure you discuss any questions you have with your health care provider. Document Revised: 10/28/2018 Document Reviewed: 02/20/2017 Elsevier Patient Education  2020 [employer]  

## 2020-04-21 NOTE — Assessment & Plan Note (Signed)
Check labs today.

## 2020-04-22 LAB — COMPREHENSIVE METABOLIC PANEL
AG Ratio: 0.9 (calc) — ABNORMAL LOW (ref 1.0–2.5)
ALT: 12 U/L (ref 9–46)
AST: 16 U/L (ref 10–35)
Albumin: 3.9 g/dL (ref 3.6–5.1)
Alkaline phosphatase (APISO): 69 U/L (ref 35–144)
BUN: 14 mg/dL (ref 7–25)
CO2: 28 mmol/L (ref 20–32)
Calcium: 9 mg/dL (ref 8.6–10.3)
Chloride: 103 mmol/L (ref 98–110)
Creat: 0.86 mg/dL (ref 0.70–1.11)
Globulin: 4.5 g/dL (calc) — ABNORMAL HIGH (ref 1.9–3.7)
Glucose, Bld: 85 mg/dL (ref 65–99)
Potassium: 4.3 mmol/L (ref 3.5–5.3)
Sodium: 140 mmol/L (ref 135–146)
Total Bilirubin: 0.5 mg/dL (ref 0.2–1.2)
Total Protein: 8.4 g/dL — ABNORMAL HIGH (ref 6.1–8.1)

## 2020-04-22 LAB — LIPID PANEL
Cholesterol: 128 mg/dL (ref <200)
HDL: 51 mg/dL (ref >=40)
LDL Cholesterol (Calc): 59 mg/dL (calc)
Non-HDL Cholesterol (Calc): 77 mg/dL (calc) (ref <130)
Total CHOL/HDL Ratio: 2.5 (calc) (ref <5.0)
Triglycerides: 96 mg/dL (ref <150)

## 2020-04-22 LAB — PSA: PSA: 1.48 ng/mL (ref <=4.0)

## 2020-05-10 ENCOUNTER — Ambulatory Visit: Payer: PPO

## 2020-05-11 ENCOUNTER — Other Ambulatory Visit: Payer: Self-pay

## 2020-05-11 ENCOUNTER — Ambulatory Visit (INDEPENDENT_AMBULATORY_CARE_PROVIDER_SITE_OTHER): Payer: PPO

## 2020-05-11 DIAGNOSIS — E538 Deficiency of other specified B group vitamins: Secondary | ICD-10-CM | POA: Diagnosis not present

## 2020-05-11 MED ORDER — CYANOCOBALAMIN 1000 MCG/ML IJ SOLN
1000.0000 ug | INTRAMUSCULAR | Status: DC
  Administered 2020-05-11 – 2021-06-20 (×4): 1000 ug via INTRAMUSCULAR

## 2020-05-11 NOTE — Progress Notes (Signed)
Patient here today for monthly  B12 1,000 mcg dose, per Dr. Etter Sjogren.   Injection given in Left Deltoid, and patient tolerated well.  One month appointment scheduled.

## 2020-05-13 DIAGNOSIS — L570 Actinic keratosis: Secondary | ICD-10-CM | POA: Diagnosis not present

## 2020-05-13 DIAGNOSIS — L820 Inflamed seborrheic keratosis: Secondary | ICD-10-CM | POA: Diagnosis not present

## 2020-05-24 ENCOUNTER — Encounter: Payer: Self-pay | Admitting: Family Medicine

## 2020-05-24 ENCOUNTER — Other Ambulatory Visit: Payer: Self-pay

## 2020-05-24 ENCOUNTER — Ambulatory Visit (INDEPENDENT_AMBULATORY_CARE_PROVIDER_SITE_OTHER): Payer: PPO | Admitting: Family Medicine

## 2020-05-24 VITALS — BP 132/70 | HR 63 | Temp 97.8°F | Resp 18 | Ht 70.0 in | Wt 187.2 lb

## 2020-05-24 DIAGNOSIS — N138 Other obstructive and reflux uropathy: Secondary | ICD-10-CM

## 2020-05-24 DIAGNOSIS — Z0000 Encounter for general adult medical examination without abnormal findings: Secondary | ICD-10-CM | POA: Diagnosis not present

## 2020-05-24 DIAGNOSIS — E785 Hyperlipidemia, unspecified: Secondary | ICD-10-CM | POA: Diagnosis not present

## 2020-05-24 DIAGNOSIS — N401 Enlarged prostate with lower urinary tract symptoms: Secondary | ICD-10-CM | POA: Diagnosis not present

## 2020-05-24 MED ORDER — FINASTERIDE 5 MG PO TABS: 5.0000 mg | ORAL_TABLET | Freq: Every day | ORAL | 1 refills | Status: DC

## 2020-05-24 MED ORDER — SIMVASTATIN 20 MG PO TABS: ORAL_TABLET | ORAL | 1 refills | Status: DC

## 2020-05-24 NOTE — Assessment & Plan Note (Signed)
Check labs  ghm utd See avs 

## 2020-05-24 NOTE — Patient Instructions (Signed)
Preventive Care 84 Years and Older, Male Preventive care refers to lifestyle choices and visits with your health care provider that can promote health and wellness. This includes:  A yearly physical exam. This is also called an annual well check.  Regular dental and eye exams.  Immunizations.  Screening for certain conditions.  Healthy lifestyle choices, such as diet and exercise. What can I expect for my preventive care visit? Physical exam Your health care provider will check:  Height and weight. These may be used to calculate body mass index (BMI), which is a measurement that tells if you are at a healthy weight.  Heart rate and blood pressure.  Your skin for abnormal spots. Counseling Your health care provider may ask you questions about:  Alcohol, tobacco, and drug use.  Emotional well-being.  Home and relationship well-being.  Sexual activity.  Eating habits.  History of falls.  Memory and ability to understand (cognition).  Work and work Statistician. What immunizations do I need?  Influenza (flu) vaccine  This is recommended every year. Tetanus, diphtheria, and pertussis (Tdap) vaccine  You may need a Td booster every 10 years. Varicella (chickenpox) vaccine  You may need this vaccine if you have not already been vaccinated. Zoster (shingles) vaccine  You may need this after age 50. Pneumococcal conjugate (PCV13) vaccine  One dose is recommended after age 24. Pneumococcal polysaccharide (PPSV23) vaccine  One dose is recommended after age 33. Measles, mumps, and rubella (MMR) vaccine  You may need at least one dose of MMR if you were born in 1957 or later. You may also need a second dose. Meningococcal conjugate (MenACWY) vaccine  You may need this if you have certain conditions. Hepatitis A vaccine  You may need this if you have certain conditions or if you travel or work in places where you may be exposed to hepatitis A. Hepatitis B vaccine   You may need this if you have certain conditions or if you travel or work in places where you may be exposed to hepatitis B. Haemophilus influenzae type b (Hib) vaccine  You may need this if you have certain conditions. You may receive vaccines as individual doses or as more than one vaccine together in one shot (combination vaccines). Talk with your health care provider about the risks and benefits of combination vaccines. What tests do I need? Blood tests  Lipid and cholesterol levels. These may be checked every 5 years, or more frequently depending on your overall health.  Hepatitis C test.  Hepatitis B test. Screening  Lung cancer screening. You may have this screening every year starting at age 84 if you have a 30-pack-year history of smoking and currently smoke or have quit within the past 15 years.  Colorectal cancer screening. All adults should have this screening starting at age 84 and continuing until age 54. Your health care provider may recommend screening at age 47 if you are at increased risk. You will have tests every 1-10 years, depending on your results and the type of screening test.  Prostate cancer screening. Recommendations will vary depending on your family history and other risks.  Diabetes screening. This is done by checking your blood sugar (glucose) after you have not eaten for a while (fasting). You may have this done every 1-3 years.  Abdominal aortic aneurysm (AAA) screening. You may need this if you are a current or former smoker.  Sexually transmitted disease (STD) testing. Follow these instructions at home: Eating and drinking  Eat  a diet that includes fresh fruits and vegetables, whole grains, lean protein, and low-fat dairy products. Limit your intake of foods with high amounts of sugar, saturated fats, and salt.  Take vitamin and mineral supplements as recommended by your health care provider.  Do not drink alcohol if your health care  provider tells you not to drink.  If you drink alcohol: ? Limit how much you have to 0-2 drinks a day. ? Be aware of how much alcohol is in your drink. In the [place] one drink equals one 12 oz bottle of beer (355 mL), one 5 oz glass of wine (148 mL), or one 1 oz glass of hard liquor (44 mL). Lifestyle  Take daily care of your teeth and gums.  Stay active. Exercise for at least 30 minutes on 5 or more days each week.  Do not use any products that contain nicotine or tobacco, such as cigarettes, e-cigarettes, and chewing tobacco. If you need help quitting, ask your health care provider.  If you are sexually active, practice safe sex. Use a condom or other form of protection to prevent STIs (sexually transmitted infections).  Talk with your health care provider about taking a low-dose aspirin or statin. What's next?  Visit your health care provider once a year for a well check visit.  Ask your health care provider how often you should have your eyes and teeth checked.  Stay up to date on all vaccines. This information is not intended to replace advice given to you by your health care provider. Make sure you discuss any questions you have with your health care provider. Document Revised: 07/03/2018 Document Reviewed: 07/03/2018 Elsevier Patient Education  2020 [employer]  

## 2020-05-24 NOTE — Assessment & Plan Note (Addendum)
psa done in sept was good  Lab Results  Component Value Date   PSA 1.48 04/21/2020   PSA 1.32 07/28/2018

## 2020-05-24 NOTE — Assessment & Plan Note (Addendum)
Tolerating statin, encouraged heart healthy diet, avoid trans fats, minimize simple carbs and saturated fats. Increase exercise as tolerated  Lab Results  Component Value Date   CHOL 128 04/21/2020   HDL 51 04/21/2020   LDLCALC 59 04/21/2020   TRIG 96 04/21/2020   CHOLHDL 2.5 04/21/2020

## 2020-05-24 NOTE — Progress Notes (Signed)
Patient ID: Justin [name], male    DOB: [birth year]  Age: 86 y.o. MRN: [id number]    Subjective:  Subjective  HPI Justin [name] presents for cpe and labs.   Justin [name] has had trouble with his hearing and is unable to get hearing aids from [place]--- Justin [name] is under the [facility]  Review of Systems  Constitutional: Negative for appetite change, diaphoresis, fatigue and unexpected weight change.  HENT: Positive for hearing loss. Negative for congestion, dental problem, drooling, ear discharge, ear pain, facial swelling, mouth sores, nosebleeds, postnasal drip, rhinorrhea, sinus pressure, sinus pain, sneezing, sore throat, tinnitus, trouble swallowing and voice change.   Eyes: Negative for pain, redness and visual disturbance.  Respiratory: Negative for cough, chest tightness, shortness of breath and wheezing.   Cardiovascular: Negative for chest pain, palpitations and leg swelling.  Endocrine: Negative for cold intolerance, heat intolerance, polydipsia, polyphagia and polyuria.  Genitourinary: Negative for difficulty urinating, dysuria and frequency.  Neurological: Negative for dizziness, light-headedness, numbness and headaches.    History Past Medical History:  Diagnosis Date  . Anemia   . Basal cell cancer    Dr Wilhemina Bonito  . Benign prostatic hypertrophy    Dr Amalia Hailey  . Carpal tunnel syndrome 10/19/2015   Right  . Chronic kidney disease   . Colon polyp   . Diverticulosis   . Hyperlipidemia   . Kidney stones   . Shingles   . Stroke ([place])   . Transient ischemic attack    Dr Leonie Man    Justin [name] has a past surgical history that includes Lithotripsy; Hemorrhoid surgery; Colonoscopy w/ polypectomy (01/2009); Cataract extraction, bilateral; and Prostate biopsy.   His family history includes Colon cancer in his mother; Colon polyps in his sister; Coronary artery disease in his brother; Diabetes in his brother and mother; Heart attack in his brother and another family member; Heart attack  (age of onset: 91) in his father; Heart failure in his father; Other in his sister; Prostate cancer in his brother; Stroke in his brother and brother.Justin [name] reports that Justin [name] quit smoking about 43 years ago. His smoking use included cigarettes, pipe, and cigars. Justin [name] has quit using smokeless tobacco.  His smokeless tobacco use included chew. Justin [name] reports that Justin [name] does not drink alcohol and does not use drugs.  Current Outpatient Medications on File Prior to Visit  Medication Sig Dispense Refill  . aspirin 81 MG tablet Take 81 mg by mouth daily.     Current Facility-Administered Medications on File Prior to Visit  Medication Dose Route Frequency Provider Last Rate Last Admin  . cyanocobalamin ((VITAMIN B-12)) injection 1,000 mcg  1,000 mcg Intramuscular Q30 days Carollee Herter, Kendrick Fries R, DO   1,000 mcg at 05/11/20 1142      Health Maintenance  Topic Date Due  . PNA vac Low Risk Adult (1 of 2 - PCV13) 05/25/2020 (Originally [birth year]/1992)  . TETANUS/TDAP  12/04/2022  . INFLUENZA VACCINE  Completed  . COVID-19 Vaccine  Completed   Pt did not want to get any vaccines today due to just receiving a flu shot --- she wanted to wait Objective:  Objective  Physical Exam Vitals and nursing note reviewed.  Constitutional:      General: Justin [name] is sleeping. Justin [name] is not in acute distress.    Appearance: Justin [name] is well-developed. Justin [name] is not diaphoretic.  HENT:     Head: Normocephalic and atraumatic.     Right Ear: External ear normal.     Left Ear: External  ear normal.     Nose: Nose normal.     Mouth/Throat:     Pharynx: No oropharyngeal exudate.  Eyes:     General:        Right eye: No discharge.        Left eye: No discharge.     Conjunctiva/sclera: Conjunctivae normal.     Pupils: Pupils are equal, round, and reactive to light.  Neck:     Thyroid: No thyromegaly.     Vascular: No JVD.  Cardiovascular:     Rate and Rhythm: Normal rate and regular rhythm.     Heart sounds: Normal heart sounds. No murmur heard.    Pulmonary:     Effort: Pulmonary effort is normal. No respiratory distress.     Breath sounds: Normal breath sounds. No wheezing or rales.  Chest:     Chest wall: No tenderness.  Abdominal:     General: Bowel sounds are normal. There is no distension.     Palpations: Abdomen is soft. There is no mass.     Tenderness: There is no abdominal tenderness. There is no guarding or rebound.  Genitourinary:    Comments: Pt preferred no gu exam Musculoskeletal:        General: No tenderness. Normal range of motion.     Cervical back: Normal range of motion and neck supple.  Lymphadenopathy:     Cervical: No cervical adenopathy.  Skin:    General: Skin is warm and dry.     Findings: No erythema or rash.  Neurological:     Mental Status: Justin [name] is oriented to person, place, and time.     Cranial Nerves: No cranial nerve deficit.     Motor: No abnormal muscle tone.     Deep Tendon Reflexes: Reflexes are normal and symmetric. Reflexes normal.  Psychiatric:        Behavior: Behavior normal.        Thought Content: Thought content normal.        Judgment: Judgment normal.    BP 132/70 (BP Location: Right Arm, Patient Position: Sitting, Cuff Size: Normal)   Pulse 63   Temp 97.8 F (36.6 C) (Oral)   Resp 18   Ht 5\' 10"  (1.778 m)   Wt 187 lb 3.2 oz (84.9 kg)   SpO2 95%   BMI 26.86 kg/m  Wt Readings from Last 3 Encounters:  05/24/20 187 lb 3.2 oz (84.9 kg)  04/21/20 187 lb (84.8 kg)  02/09/20 187 lb (84.8 kg)     Lab Results  Component Value Date   WBC 7.3 07/28/2018   HGB 12.0 (L) 07/28/2018   HCT 34.4 (L) 07/28/2018   PLT 196.0 07/28/2018   GLUCOSE 85 04/21/2020   CHOL 128 04/21/2020   TRIG 96 04/21/2020   HDL 51 04/21/2020   LDLCALC 59 04/21/2020   ALT 12 04/21/2020   AST 16 04/21/2020   NA 140 04/21/2020   K 4.3 04/21/2020   CL 103 04/21/2020   CREATININE 0.86 04/21/2020   BUN 14 04/21/2020   CO2 28 04/21/2020   TSH 3.98 10/06/2018   PSA 1.48 04/21/2020   HGBA1C  5.4 01/10/2016   MICROALBUR 1.3 01/10/2016    DG Knee Complete 4 Views Right  Result Date: 10/01/2019 CLINICAL DATA:  Fall on ice, right knee pain EXAM: RIGHT KNEE - COMPLETE 4+ VIEW COMPARISON:  None. FINDINGS: No evidence of fracture, dislocation, or joint effusion. No evidence of arthropathy or other focal bone abnormality. Soft tissue  edema anteriorly. IMPRESSION: No fracture or dislocation of the right knee. Joint spaces are very well preserved for patient age. Soft tissue edema anteriorly. Electronically Signed   By: Eddie Candle M.D.   On: 10/01/2019 14:08     Assessment & Plan:  Plan  I am having [name] maintain his aspirin, finasteride, and simvastatin. We will continue to administer cyanocobalamin.  Meds ordered this encounter  Medications  . finasteride (PROSCAR) 5 MG tablet    Sig: Take 1 tablet (5 mg total) by mouth daily.    Dispense:  90 tablet    Refill:  1  . simvastatin (ZOCOR) 20 MG tablet    Sig: 1 po qd    Dispense:  90 tablet    Refill:  1    Problem List Items Addressed This Visit      Unprioritized   Benign prostatic hyperplasia with urinary obstruction    psa done in sept was good  Lab Results  Component Value Date   PSA 1.48 04/21/2020   PSA 1.32 07/28/2018          Relevant Medications   finasteride (PROSCAR) 5 MG tablet   Hyperlipidemia LDL goal <100    Tolerating statin, encouraged heart healthy diet, avoid trans fats, minimize simple carbs and saturated fats. Increase exercise as tolerated  Lab Results  Component Value Date   CHOL 128 04/21/2020   HDL 51 04/21/2020   LDLCALC 59 04/21/2020   TRIG 96 04/21/2020   CHOLHDL 2.5 04/21/2020        Relevant Medications   simvastatin (ZOCOR) 20 MG tablet   Preventative health care - Primary    Check labs ghm utd  See avs          Follow-up: Return in about 6 months (around 11/21/2020) for hypertension, hyperlipidemia.  [name], DO

## 2020-06-10 ENCOUNTER — Ambulatory Visit (INDEPENDENT_AMBULATORY_CARE_PROVIDER_SITE_OTHER): Payer: PPO

## 2020-06-10 ENCOUNTER — Other Ambulatory Visit: Payer: Self-pay

## 2020-06-10 DIAGNOSIS — E538 Deficiency of other specified B group vitamins: Secondary | ICD-10-CM

## 2020-06-10 MED ORDER — CYANOCOBALAMIN 1000 MCG/ML IJ SOLN
1000.0000 ug | Freq: Once | INTRAMUSCULAR | Status: AC
  Administered 2020-06-10: 1000 ug via INTRAMUSCULAR

## 2020-06-10 NOTE — Progress Notes (Signed)
Pt here for monthly B12 injection per Dr. Lowne  B12 1000mcg given IM in left deltoid, and pt tolerated injection well.  Next B12 injection scheduled for next month.  

## 2020-07-12 ENCOUNTER — Ambulatory Visit (INDEPENDENT_AMBULATORY_CARE_PROVIDER_SITE_OTHER): Payer: PPO

## 2020-07-12 ENCOUNTER — Other Ambulatory Visit: Payer: Self-pay

## 2020-07-12 DIAGNOSIS — E538 Deficiency of other specified B group vitamins: Secondary | ICD-10-CM

## 2020-07-12 MED ORDER — CYANOCOBALAMIN 1000 MCG/ML IJ SOLN
1000.0000 ug | Freq: Once | INTRAMUSCULAR | Status: AC
  Administered 2020-07-12: 1000 ug via INTRAMUSCULAR

## 2020-07-12 NOTE — Progress Notes (Signed)
Pt here for monthly B12 injection per Dr. Lowne  B12 1000mcg given IM, and pt tolerated injection well.  Next B12 injection scheduled for next month.    

## 2020-07-25 ENCOUNTER — Ambulatory Visit: Payer: PPO | Admitting: Family Medicine

## 2020-08-07 IMAGING — DX DG KNEE COMPLETE 4+V*R*
4 series · 4 of 4 positions shown · non-contrast
Comparison: None.

CLINICAL DATA: Fall on ice, right knee pain

EXAM:
RIGHT KNEE - COMPLETE 4+ VIEW

[knee ap]
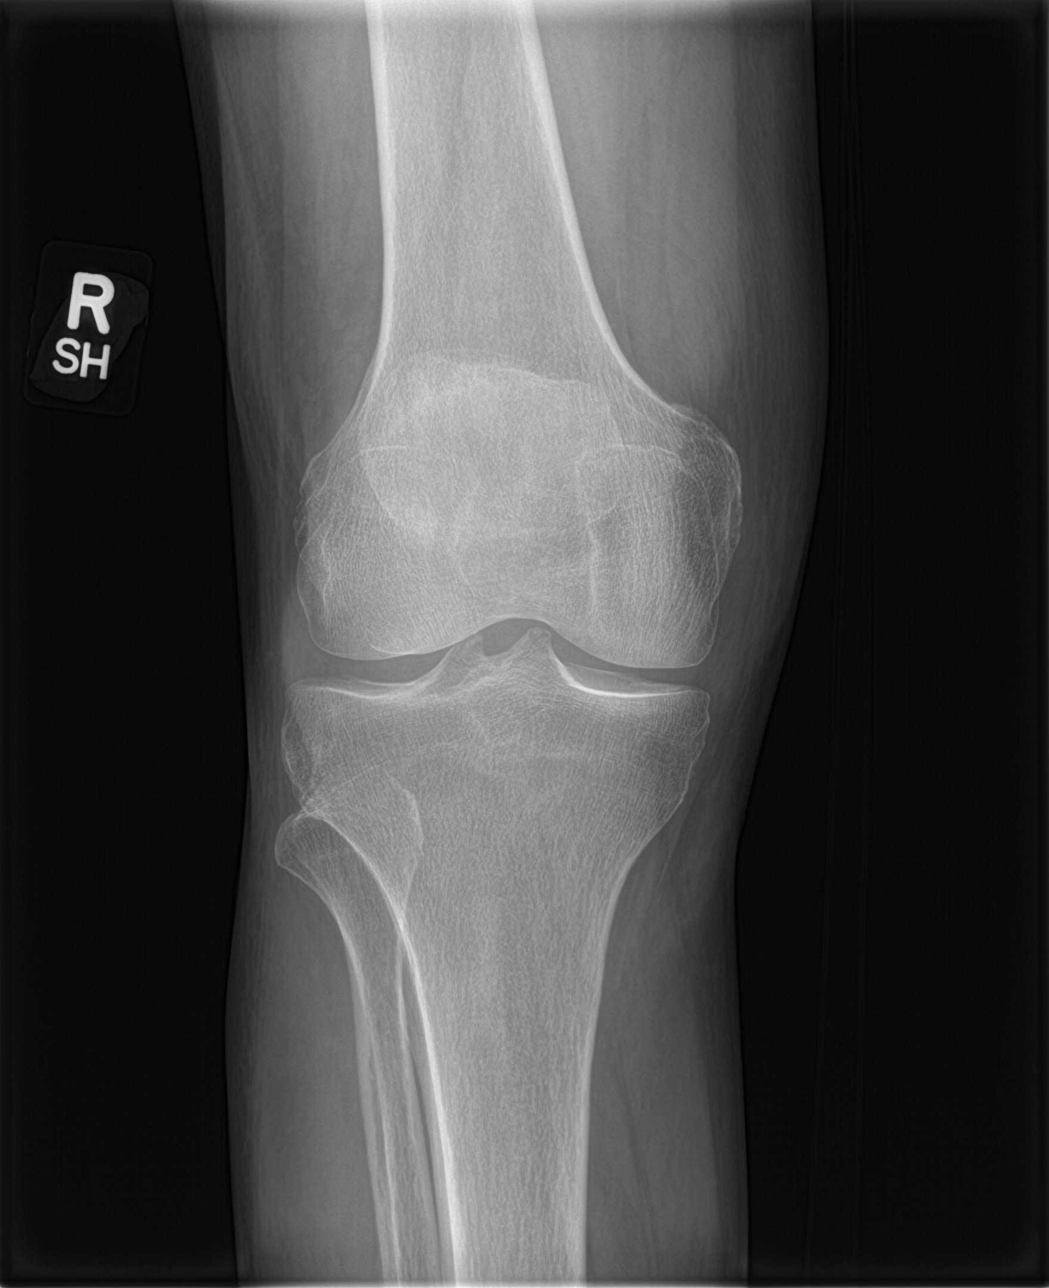

[knee lat]
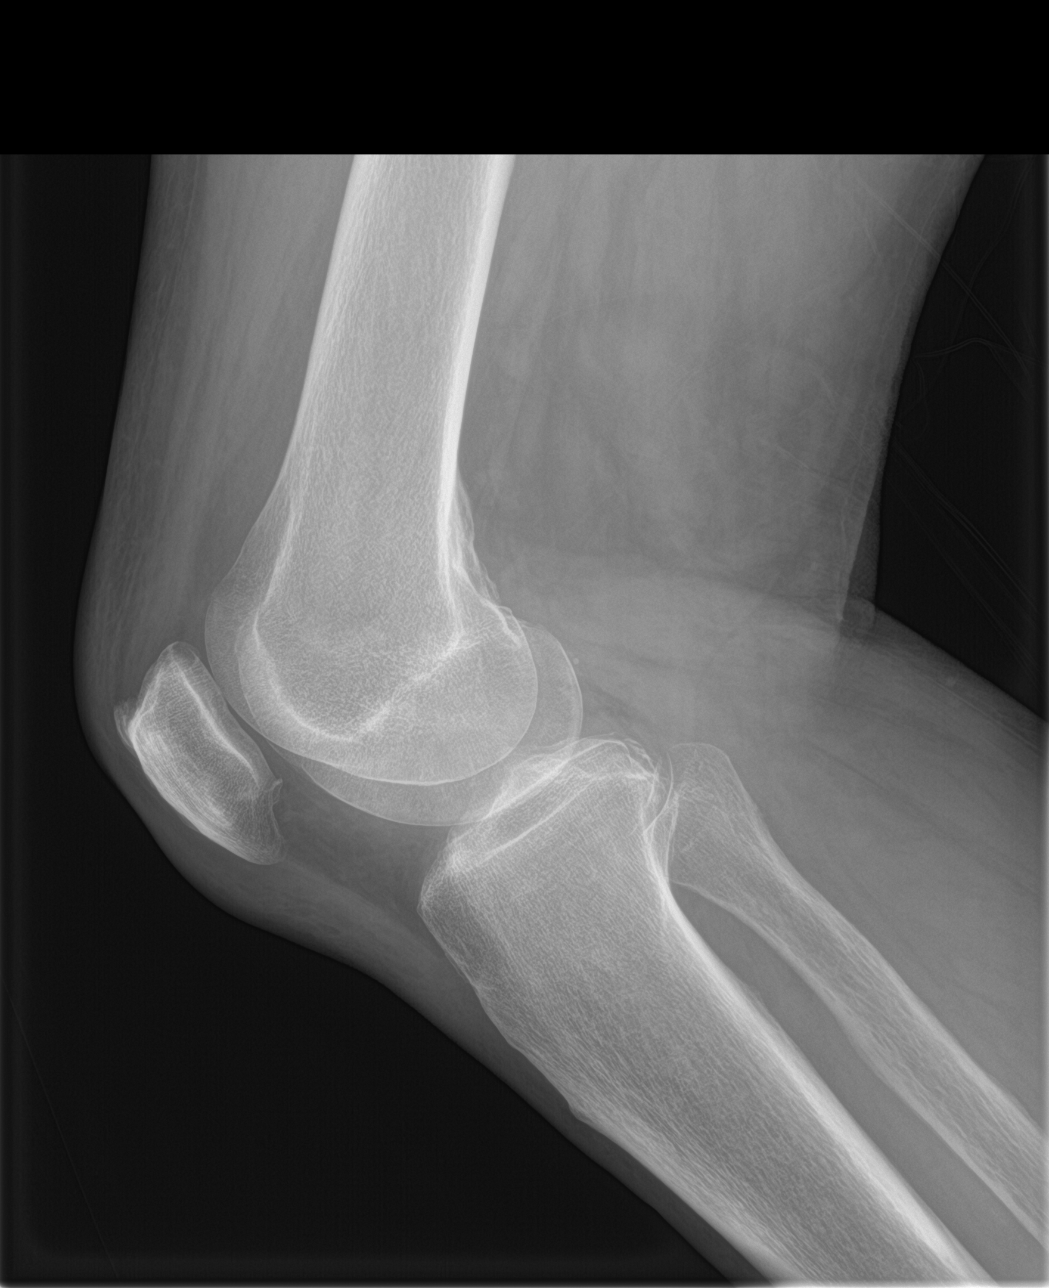

[knee obl (1 of 2)]
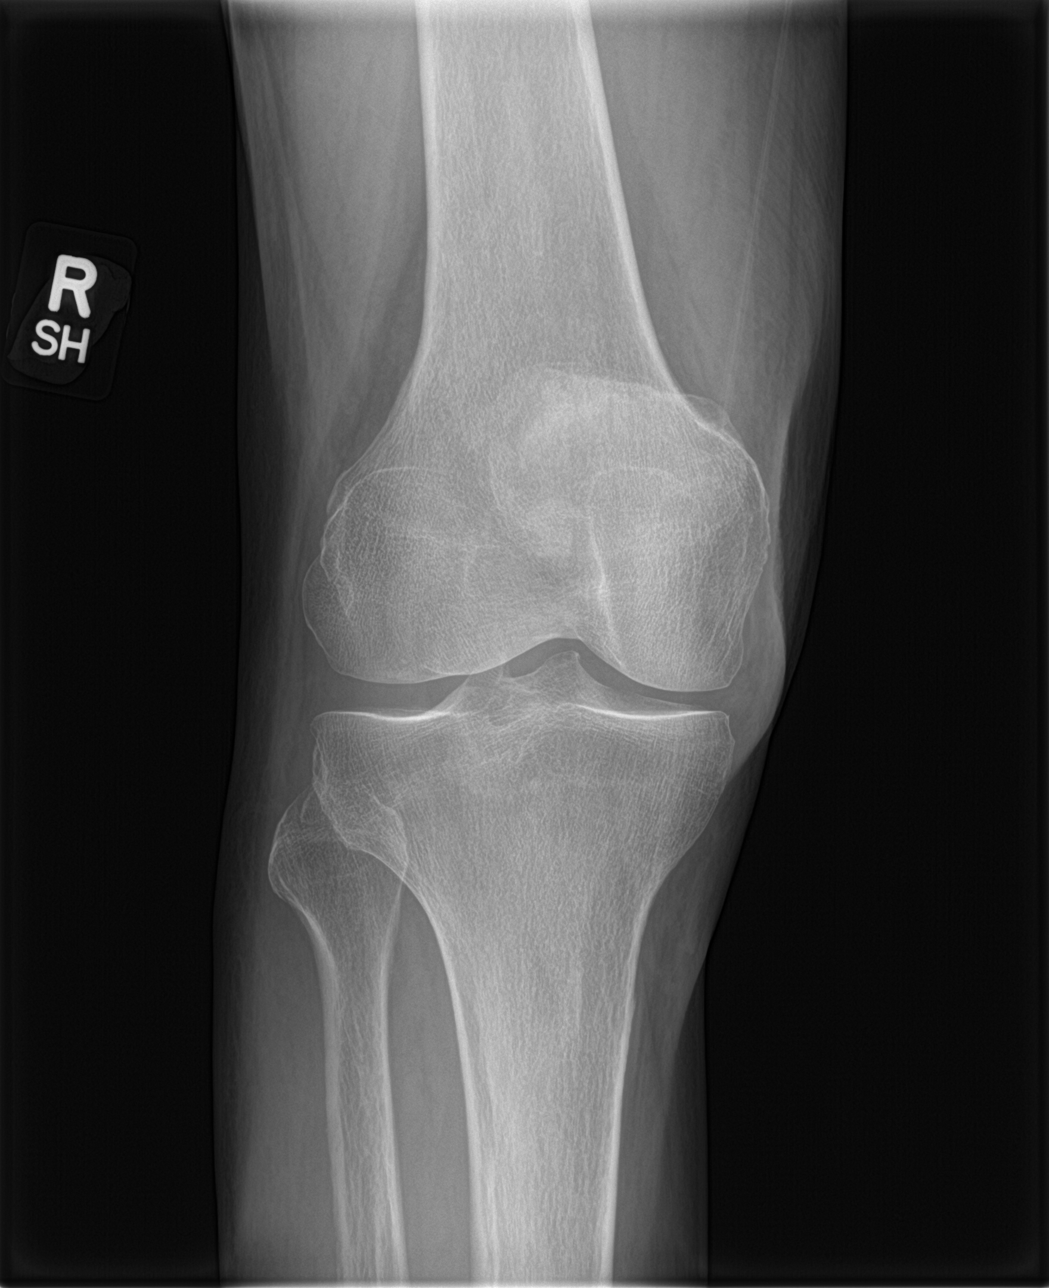

[knee obl (2 of 2)]
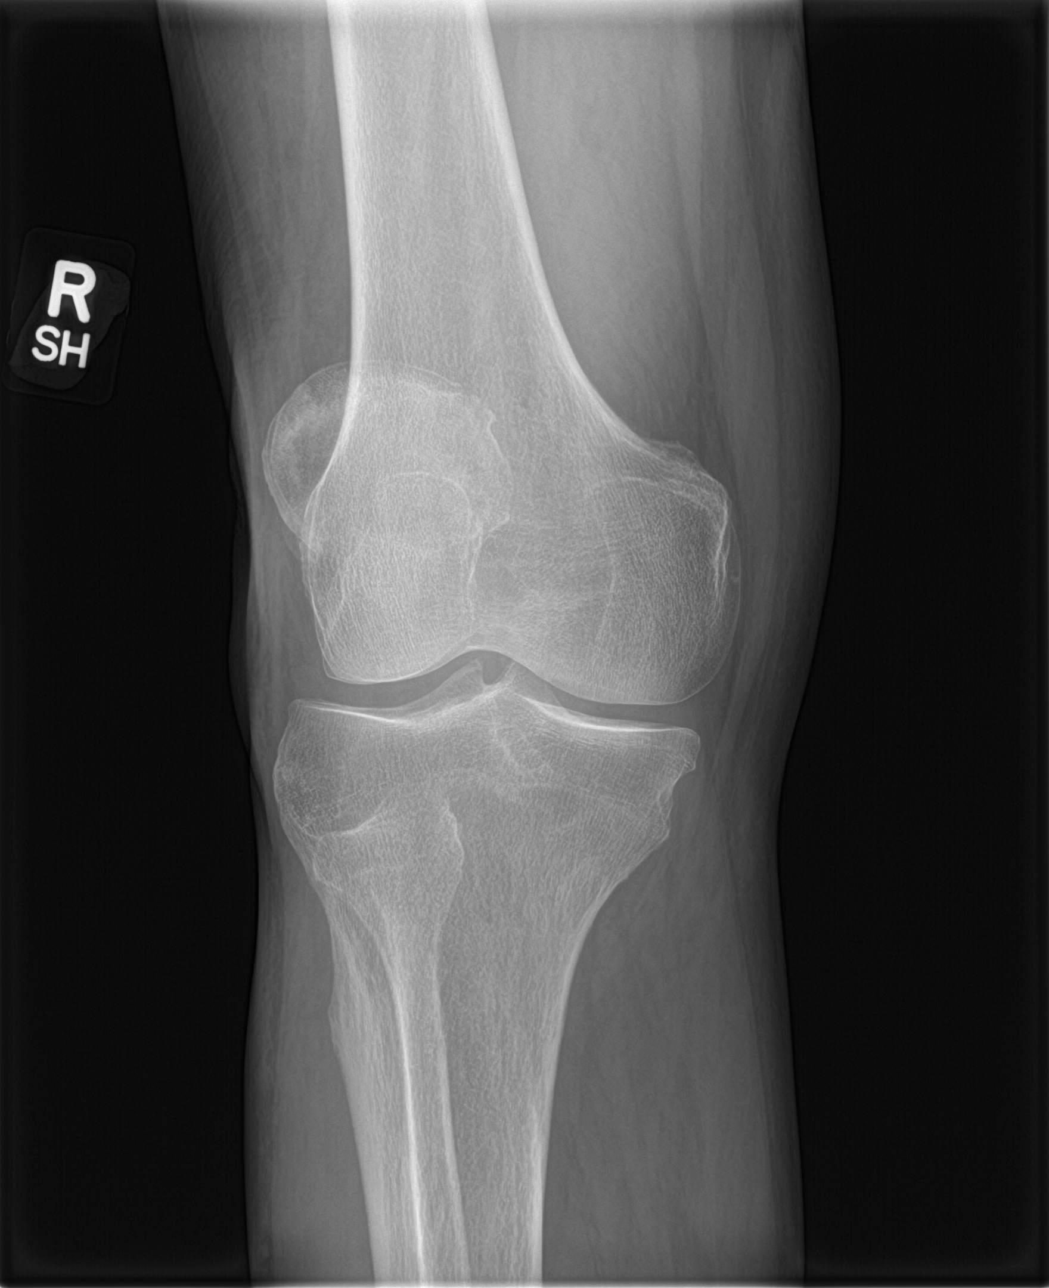

[4 of 4 positions shown; findings below may reference images not displayed]

FINDINGS: No evidence of fracture, dislocation, or joint effusion. No evidence
of arthropathy or other focal bone abnormality. Soft tissue edema
anteriorly.
IMPRESSION: No fracture or dislocation of the right knee. Joint spaces are very
well preserved for patient age. Soft tissue edema anteriorly.

## 2020-08-12 ENCOUNTER — Encounter: Payer: Self-pay | Admitting: Family Medicine

## 2020-08-12 ENCOUNTER — Ambulatory Visit (INDEPENDENT_AMBULATORY_CARE_PROVIDER_SITE_OTHER): Payer: PPO | Admitting: Family Medicine

## 2020-08-12 ENCOUNTER — Other Ambulatory Visit: Payer: Self-pay

## 2020-08-12 DIAGNOSIS — J029 Acute pharyngitis, unspecified: Secondary | ICD-10-CM

## 2020-08-12 DIAGNOSIS — E538 Deficiency of other specified B group vitamins: Secondary | ICD-10-CM

## 2020-08-12 MED ORDER — AMOXICILLIN 875 MG PO TABS: 875.0000 mg | ORAL_TABLET | Freq: Two times a day (BID) | ORAL | 0 refills | Status: DC

## 2020-08-12 MED ORDER — CYANOCOBALAMIN 1000 MCG/ML IJ SOLN
1000.0000 ug | Freq: Once | INTRAMUSCULAR | Status: AC
  Administered 2020-08-12: 1000 ug via INTRAMUSCULAR

## 2020-08-12 NOTE — Progress Notes (Signed)
Virtual Visit via Telephone Note  I connected with Justin [name] on 08/12/20 at 10:30 AM EST by telephone and verified that I am speaking with the correct person using two identifiers.  Location: Patient: in car  Provider: office    I discussed the limitations, risks, security and privacy concerns of performing an evaluation and management service by telephone and the availability of in person appointments. I also discussed with the patient that there may be a patient responsible charge related to this service. The patient expressed understanding and agreed to proceed.   History of Present Illness: Pt is c/o  Sore throat and nose is burning  Since Wednesday.  No fever  No cough/ congestion  His wife it getting tested for covid next week     Observations/Objective: There were no vitals filed for this visit. Pt is in nad but c/o sore throat that is keeping him up No sob  Assessment and Plan: 1. Vitamin B12 deficiency Given today --- then pt mentioned sore throat and visit was changed to phone call  - cyanocobalamin ((VITAMIN B-12)) injection 1,000 mcg  2. Pharyngitis, unspecified etiology Tylenol Gargle with salt water  Antihistamine ie zyrtec, claritin  Pt to get covid test  - amoxicillin (AMOXIL) 875 MG tablet; Take 1 tablet (875 mg total) by mouth 2 (two) times daily.  Dispense: 20 tablet; Refill: 0   Follow Up Instructions:    I discussed the assessment and treatment plan with the patient. The patient was provided an opportunity to ask questions and all were answered. The patient agreed with the plan and demonstrated an understanding of the instructions.   The patient was advised to call back or seek an in-person evaluation if the symptoms worsen or if the condition fails to improve as anticipated.  I provided 25 minutes of non-face-to-face time during this encounter.   [name], DO

## 2020-08-16 ENCOUNTER — Emergency Department (HOSPITAL_BASED_OUTPATIENT_CLINIC_OR_DEPARTMENT_OTHER)
Admission: EM | Admit: 2020-08-16 | Discharge: 2020-08-16 | Disposition: A | Discharge status: Home / Self Care | Payer: PPO | Attending: Emergency Medicine | Admitting: Emergency Medicine

## 2020-08-16 ENCOUNTER — Telehealth: Payer: Self-pay | Admitting: Family Medicine

## 2020-08-16 ENCOUNTER — Other Ambulatory Visit: Payer: Self-pay

## 2020-08-16 ENCOUNTER — Emergency Department (HOSPITAL_BASED_OUTPATIENT_CLINIC_OR_DEPARTMENT_OTHER): Payer: PPO

## 2020-08-16 ENCOUNTER — Encounter (HOSPITAL_BASED_OUTPATIENT_CLINIC_OR_DEPARTMENT_OTHER): Payer: Self-pay | Admitting: *Deleted

## 2020-08-16 DIAGNOSIS — S0990XA Unspecified injury of head, initial encounter: Secondary | ICD-10-CM | POA: Diagnosis not present

## 2020-08-16 DIAGNOSIS — E039 Hypothyroidism, unspecified: Secondary | ICD-10-CM | POA: Diagnosis not present

## 2020-08-16 DIAGNOSIS — Z8673 Personal history of transient ischemic attack (TIA), and cerebral infarction without residual deficits: Secondary | ICD-10-CM | POA: Diagnosis not present

## 2020-08-16 DIAGNOSIS — R519 Headache, unspecified: Secondary | ICD-10-CM | POA: Diagnosis not present

## 2020-08-16 DIAGNOSIS — Z87891 Personal history of nicotine dependence: Secondary | ICD-10-CM | POA: Diagnosis not present

## 2020-08-16 DIAGNOSIS — Z85828 Personal history of other malignant neoplasm of skin: Secondary | ICD-10-CM | POA: Diagnosis not present

## 2020-08-16 DIAGNOSIS — Z7982 Long term (current) use of aspirin: Secondary | ICD-10-CM | POA: Diagnosis not present

## 2020-08-16 DIAGNOSIS — W000XXA Fall on same level due to ice and snow, initial encounter: Secondary | ICD-10-CM | POA: Diagnosis not present

## 2020-08-16 DIAGNOSIS — N189 Chronic kidney disease, unspecified: Secondary | ICD-10-CM | POA: Insufficient documentation

## 2020-08-16 NOTE — Telephone Encounter (Signed)
Yes-- I agree if he hit his head and has a headache he should be seen in er  We can not see him until Thursday

## 2020-08-16 NOTE — ED Triage Notes (Signed)
He slipped on ice a week ago and fell hitting the right side of his head on the dirt ground. No LOC. He does not take blood thinners. He is alert and oriented. Here with headaches since the fall.

## 2020-08-16 NOTE — ED Notes (Signed)
Pt states he wants to know if it is ok for him to leave and come back tomorrow. Pt encouraged to stay. 16 patients ahead of him at this point.

## 2020-08-16 NOTE — Discharge Instructions (Signed)
You have been diagnosed with a concussion.  Ibuprofen or Tylenol for pain Rest, ice on head.  Stay in a quiet, non-simulating, dark environment. No TV, computer use, video games until headache is resolved completely.  Follow up with your primary care physician if headache persists.  Return to the emergency department if patient becomes lethargic, begins vomiting or other change in mental status.  HEAD INJURY If any of the following occur notify your physician or go to the Hospital Emergency Department:  Increased drowsiness, stupor or loss of consciousness  Temperature above 100 F  Vomiting  Severe headache  Blood or clear fluid dripping from the nose or ears  Stiffness of the neck  Dizziness or blurred vision  Any other unusual symptoms  PRECAUTIONS  Keep head elevated at all times for the first 24 hours (Elevate mattress if pillow is ineffective)  Do not take sedatives, narcotics or alcohol  Avoid aspirin. Use only acetaminophen (e.g. Tylenol) or ibuprofen (e.g. Advil) for relief of pain. Follow directions on the bottle for dosage.  Use ice packs for comfort

## 2020-08-16 NOTE — Telephone Encounter (Signed)
Wife is calling in behalf of patient stating patient had a fall on 08/08/20 and hit his head. His been experiencing headaches since then. Patient did not seek medical attention. Patient would like a referral to [facility] Neurology ([place]).   I offer the patient an appointment with a different provider since Dr. Etter Sjogren is booked patient declined.

## 2020-08-16 NOTE — Telephone Encounter (Signed)
Spoke with patient's wife and advised patient be seen in the ED since patient is still having a headache and advised patient would need to be seen for a referral to Neuro. FYI/Please advise

## 2020-08-17 NOTE — ED Provider Notes (Signed)
[facility] EMERGENCY DEPARTMENT Provider Note   CSN: [id number] Arrival date & time: 08/16/20  1434     History Chief Complaint  Patient presents with  . Fall  . Head Injury    Justin [name] is a 87 y.o. male.  HPI A 87 year old male presents the ER for evaluation of head injury.  Patient reports that 1 week ago he slipped on ice and fell forward striking the front of his head on the ground.  No LOC.  Patient reports he had intermittent headaches since then.  His wife was concerned and wanted him to come get checked out in the emergency department.  Patient denies any vision changes.  Is not on blood thinners.  No syncope.  No lightheadedness or dizziness.  Patient denies any pain at this time of his head.  Patient reports the pain is intermittent.  Patient denies any other injuries from the accident.  No neck pain or back pain.    Past Medical History:  Diagnosis Date  . Anemia   . Basal cell cancer    Dr Wilhemina Bonito  . Benign prostatic hypertrophy    Dr Amalia Hailey  . Carpal tunnel syndrome 10/19/2015   Right  . Chronic kidney disease   . Colon polyp   . Diverticulosis   . Hyperlipidemia   . Kidney stones   . Shingles   . Stroke ([place])   . Transient ischemic attack    Dr Leonie Man    Patient Active Problem List   Diagnosis Date Noted  . Dysuria 04/21/2020  . Left hip pain 04/21/2020  . Acute pain of right knee 10/01/2019  . Acute midline low back pain without sciatica 05/26/2019  . Dizziness 04/26/2017  . Dehydration 04/26/2017  . Allergic reaction caused by a drug 04/03/2017  . Preventative health care 10/27/2016  . Left flank pain 04/23/2016  . Hypothyroidism 10/25/2015  . Carpal tunnel syndrome 10/19/2015  . Pain, joint, hand, right 08/22/2015  . B12 deficiency 12/31/2011  . Benign prostatic hyperplasia with urinary obstruction 09/25/2011  . SKIN CANCER, HX OF 03/31/2010  . DIVERTICULOSIS, COLON 05/10/2008  . Hx of TIA (transient ischemic attack) and  stroke 05/10/2008  . History of colonic polyps 05/10/2008  . NEPHROLITHIASIS, HX OF 05/10/2008  . Hyperlipidemia LDL goal <100 09/24/2007  . Anemia 12/25/2006    Past Surgical History:  Procedure Laterality Date  . CATARACT EXTRACTION, BILATERAL    . COLONOSCOPY W/ POLYPECTOMY  01/2009   adenomatous, Dr. Alben Spittle  . HEMORRHOID SURGERY    . LITHOTRIPSY      X 2;DrAmalia Hailey  . PROSTATE BIOPSY     X2       Family History  Problem Relation Age of Onset  . Heart failure Father   . Heart attack Father 69  . Diabetes Mother   . Colon cancer Mother   . Prostate cancer Brother   . Stroke Brother   . Coronary artery disease Brother        7 vessel CBAG  . Diabetes Brother   . Heart attack Other        2 P uncles , both > 55  . Stroke Brother        in late 72s  . Colon polyps Sister   . Heart attack Brother   . Other Sister        brain hemorrhage due to a fall    Social History   Tobacco Use  . Smoking status: Former  Smoker    Types: Cigarettes, Pipe, Cigars    Quit date: 07/23/1976    Years since quitting: 44.0  . Smokeless tobacco: Former Systems developer    Types: Chew  . Tobacco comment: smoked 1943-1978, up to < 1 ppd  Substance Use Topics  . Alcohol use: No    Alcohol/week: 1.0 standard drink    Types: 1 Glasses of wine per week    Comment: rare wine  . Drug use: No    Home Medications Prior to Admission medications   Medication Sig Start Date End Date Taking? Authorizing Provider  amoxicillin (AMOXIL) 875 MG tablet Take 1 tablet (875 mg total) by mouth 2 (two) times daily. 08/12/20   [name], DO  aspirin 81 MG tablet Take 81 mg by mouth daily.    [provider]  finasteride (PROSCAR) 5 MG tablet Take 1 tablet (5 mg total) by mouth daily. 05/24/20   [name], DO  simvastatin (ZOCOR) 20 MG tablet 1 po qd 05/24/20   Roma Schanz R, DO    Allergies    Prednisone, Inderal [propranolol], and Omeprazole  Review of Systems    Review of Systems  Constitutional: Negative for chills and fever.  HENT: Negative for congestion.   Eyes: Negative for discharge and visual disturbance.  Gastrointestinal: Negative for vomiting.  Musculoskeletal: Negative for neck pain.  Skin: Negative for color change.  Neurological: Positive for headaches. Negative for dizziness, syncope and numbness.  Psychiatric/Behavioral: Negative for confusion.    Physical Exam Updated Vital Signs BP (!) 158/85 (BP Location: Left Arm)   Pulse 83   Temp 98 F (36.7 C) (Oral)   Resp 14   Ht 5\' 10"  (1.778 m)   Wt 84.9 kg   SpO2 98%   BMI 26.86 kg/m   Physical Exam Vitals and nursing note reviewed.  Constitutional:      General: He is not in acute distress.    Appearance: He is well-developed and well-nourished.  HENT:     Head: Normocephalic and atraumatic.     Comments: No skull depression or hematoma.  No pain with palpation of the temporal region.  No battle signs or raccoon eyes.  No hemotympanum.  No septal hematoma.    Right Ear: Tympanic membrane, ear canal and external ear normal.     Left Ear: Tympanic membrane, ear canal and external ear normal.     Mouth/Throat:     Mouth: Mucous membranes are moist.     Pharynx: Oropharynx is clear.  Eyes:     General: No scleral icterus.       Right eye: No discharge.        Left eye: No discharge.     Extraocular Movements: Extraocular movements intact.     Conjunctiva/sclera: Conjunctivae normal.     Pupils: Pupils are equal, round, and reactive to light.  Pulmonary:     Effort: No respiratory distress.  Musculoskeletal:        General: Normal range of motion.     Cervical back: Normal range of motion and neck supple. No tenderness.  Skin:    General: Skin is warm and dry.     Capillary Refill: Capillary refill takes less than 2 seconds.     Coloration: Skin is not pale.  Neurological:     Mental Status: He is alert and oriented to person, place, and time.     Comments: The  patient is alert, attentive, and oriented x 3.  Speech is clear. Cranial nerve II-VII grossly intact. Negative pronator drift. Sensation intact. Strength 5/5 in all extremities. Reflexes 2+ and symmetric at biceps. Rapid alternating movement and fine finger movements intact.  Posture and gait normal.   Psychiatric:        Behavior: Behavior normal.        Thought Content: Thought content normal.        Judgment: Judgment normal.     ED Results / Procedures / Treatments   Labs (all labs ordered are listed, but only abnormal results are displayed) Labs Reviewed - No data to display  EKG None  Radiology CT Head Wo Contrast  Result Date: 08/16/2020 CLINICAL DATA:  [place] on ice 1 week ago, hit right-side of head, headaches EXAM: CT HEAD WITHOUT CONTRAST TECHNIQUE: Contiguous axial images were obtained from the base of the skull through the vertex without intravenous contrast. COMPARISON:  03/31/2007 FINDINGS: Brain: No acute infarct or hemorrhage. Lateral ventricles and midline structures are unremarkable. No acute extra-axial fluid collections. No mass effect. Vascular: Marland Kitchen  Minimal atherosclerosis.  No hyperdense vessel Skull: Normal. Negative for fracture or focal lesion. Sinuses/Orbits: Mild mucosal thickening of the ethmoid and frontal sinuses. No gas fluid levels. Other: None. IMPRESSION: 1. No acute intrathoracic process. Electronically Signed   By: Randa Ngo M.D.   On: 08/16/2020 16:03    Procedures Procedures   Medications Ordered in ED Medications - No data to display  ED Course  I have reviewed the triage vital signs and the nursing notes.  Pertinent labs & imaging results that were available during my care of the patient were reviewed by me and considered in my medical decision making (see chart for details).    MDM Rules/Calculators/A&P                          87 year old presents the ER for evaluation of head injury 1 week ago.  Patient reports intermittent  headaches.  Patient has no signs of focal neurological deficit.  No traumatic injuries noted.  CT scan reviewed shows no evidence of traumatic injury.  Patient symptoms seem consistent with concussion versus postconcussive syndrome.  Patient encouraged to treat symptomatically over-the-counter.  Will need primary care follow-up.  No signs of ICH, CVA, meningitis, temporal arteritis.  Pt is hemodynamically stable, in NAD, & able to ambulate in the ED. Evaluation does not show pathology that would require ongoing emergent intervention or inpatient treatment. I explained the diagnosis to the patient. Pain has been managed & has no complaints prior to dc. Pt is comfortable with above plan and is stable for discharge at this time. All questions were answered prior to disposition. Strict return precautions for f/u to the ED were discussed. Encouraged follow up with PCP.  Final Clinical Impression(s) / ED Diagnoses Final diagnoses:  Injury of head, initial encounter    Rx / DC Orders ED Discharge Orders    None       Aaron Edelman 08/17/20 1139    Rancour, Annie Main, MD 08/18/20 [id number]

## 2020-09-13 ENCOUNTER — Other Ambulatory Visit: Payer: Self-pay

## 2020-09-13 ENCOUNTER — Ambulatory Visit (INDEPENDENT_AMBULATORY_CARE_PROVIDER_SITE_OTHER): Payer: PPO

## 2020-09-13 DIAGNOSIS — E538 Deficiency of other specified B group vitamins: Secondary | ICD-10-CM | POA: Diagnosis not present

## 2020-09-13 MED ORDER — CYANOCOBALAMIN 1000 MCG/ML IJ SOLN
1000.0000 ug | Freq: Once | INTRAMUSCULAR | Status: AC
  Administered 2020-09-13: 1000 ug via INTRAMUSCULAR

## 2020-09-13 NOTE — Progress Notes (Signed)
Pt here for monthly B12 injection per Dr. Etter Sjogren  B12 1071mcg given IM left deltoid, and pt tolerated injection well.  Next B12 injection scheduled for next month.

## 2020-10-11 ENCOUNTER — Ambulatory Visit (INDEPENDENT_AMBULATORY_CARE_PROVIDER_SITE_OTHER): Payer: PPO

## 2020-10-11 ENCOUNTER — Other Ambulatory Visit: Payer: Self-pay

## 2020-10-11 DIAGNOSIS — E538 Deficiency of other specified B group vitamins: Secondary | ICD-10-CM | POA: Diagnosis not present

## 2020-10-11 MED ORDER — CYANOCOBALAMIN 1000 MCG/ML IJ SOLN: 1000.0000 ug | Freq: Once | INTRAMUSCULAR | Status: DC

## 2020-10-11 NOTE — Progress Notes (Signed)
Coltin Casher is a 87 y.o. male presents to the office today for monthly B12 injections, per physician's orders. Original order: Patient has been receiving B12 injections for several years.  b12 (med), 1000 mcg (dose),  IM (route) was administered left deltoid (location) today. Patient tolerated injection.  Patient next injection due: next month appt made Yes  Justin [name]

## 2020-11-11 ENCOUNTER — Ambulatory Visit: Payer: PPO

## 2020-11-11 DIAGNOSIS — D485 Neoplasm of uncertain behavior of skin: Secondary | ICD-10-CM | POA: Diagnosis not present

## 2020-11-11 DIAGNOSIS — L570 Actinic keratosis: Secondary | ICD-10-CM | POA: Diagnosis not present

## 2020-11-15 ENCOUNTER — Other Ambulatory Visit: Payer: Self-pay

## 2020-11-15 ENCOUNTER — Ambulatory Visit (INDEPENDENT_AMBULATORY_CARE_PROVIDER_SITE_OTHER): Payer: PPO | Admitting: *Deleted

## 2020-11-15 DIAGNOSIS — E538 Deficiency of other specified B group vitamins: Secondary | ICD-10-CM

## 2020-11-15 MED ORDER — CYANOCOBALAMIN 1000 MCG/ML IJ SOLN
1000.0000 ug | Freq: Once | INTRAMUSCULAR | Status: AC
  Administered 2020-11-15: 1000 ug via INTRAMUSCULAR

## 2020-11-15 NOTE — Progress Notes (Signed)
Justin [name] is a 87 y.o. male presents to the office today for monthly B12 injections, per physician's orders. Original order: Patient has been receiving B12 injections for several years.  b12 (med), 1000 mcg (dose),  IM (route) was administered right deltoid (location) today. Patient tolerated injection.  Patient next injection due: next month appt made Yes

## 2020-11-23 ENCOUNTER — Emergency Department (HOSPITAL_COMMUNITY): Payer: PPO

## 2020-11-23 ENCOUNTER — Other Ambulatory Visit: Payer: Self-pay

## 2020-11-23 ENCOUNTER — Encounter (HOSPITAL_COMMUNITY): Payer: Self-pay

## 2020-11-23 ENCOUNTER — Emergency Department (HOSPITAL_COMMUNITY)
Admission: EM | Admit: 2020-11-23 | Discharge: 2020-11-23 | Disposition: A | Discharge status: Home / Self Care | Payer: PPO | Attending: Emergency Medicine | Admitting: Emergency Medicine

## 2020-11-23 DIAGNOSIS — Z87891 Personal history of nicotine dependence: Secondary | ICD-10-CM | POA: Insufficient documentation

## 2020-11-23 DIAGNOSIS — Z7982 Long term (current) use of aspirin: Secondary | ICD-10-CM | POA: Insufficient documentation

## 2020-11-23 DIAGNOSIS — E039 Hypothyroidism, unspecified: Secondary | ICD-10-CM | POA: Diagnosis not present

## 2020-11-23 DIAGNOSIS — N189 Chronic kidney disease, unspecified: Secondary | ICD-10-CM | POA: Insufficient documentation

## 2020-11-23 DIAGNOSIS — R519 Headache, unspecified: Secondary | ICD-10-CM | POA: Diagnosis not present

## 2020-11-23 DIAGNOSIS — M542 Cervicalgia: Secondary | ICD-10-CM | POA: Insufficient documentation

## 2020-11-23 DIAGNOSIS — R42 Dizziness and giddiness: Secondary | ICD-10-CM | POA: Diagnosis not present

## 2020-11-23 DIAGNOSIS — I6782 Cerebral ischemia: Secondary | ICD-10-CM | POA: Diagnosis not present

## 2020-11-23 DIAGNOSIS — Z85828 Personal history of other malignant neoplasm of skin: Secondary | ICD-10-CM | POA: Diagnosis not present

## 2020-11-23 DIAGNOSIS — R531 Weakness: Secondary | ICD-10-CM | POA: Diagnosis not present

## 2020-11-23 DIAGNOSIS — G319 Degenerative disease of nervous system, unspecified: Secondary | ICD-10-CM | POA: Diagnosis not present

## 2020-11-23 DIAGNOSIS — R079 Chest pain, unspecified: Secondary | ICD-10-CM | POA: Diagnosis not present

## 2020-11-23 DIAGNOSIS — I739 Peripheral vascular disease, unspecified: Secondary | ICD-10-CM | POA: Diagnosis not present

## 2020-11-23 DIAGNOSIS — Z20822 Contact with and (suspected) exposure to covid-19: Secondary | ICD-10-CM | POA: Diagnosis not present

## 2020-11-23 DIAGNOSIS — R0602 Shortness of breath: Secondary | ICD-10-CM | POA: Diagnosis not present

## 2020-11-23 LAB — CBC WITH DIFFERENTIAL/PLATELET
Abs Immature Granulocytes: 0.01 10*3/uL (ref 0.00–0.07)
Basophils Absolute: 0 10*3/uL (ref 0.0–0.1)
Basophils Relative: 1 %
Eosinophils Absolute: 0.2 10*3/uL (ref 0.0–0.5)
Eosinophils Relative: 4 %
HCT: 31 % — ABNORMAL LOW (ref 39.0–52.0)
Hemoglobin: 10.8 g/dL — ABNORMAL LOW (ref 13.0–17.0)
Immature Granulocytes: 0 %
Lymphocytes Relative: 25 %
Lymphs Abs: 1.1 10*3/uL (ref 0.7–4.0)
MCH: 35.6 pg — ABNORMAL HIGH (ref 26.0–34.0)
MCHC: 34.8 g/dL (ref 30.0–36.0)
MCV: 102.3 fL — ABNORMAL HIGH (ref 80.0–100.0)
Monocytes Absolute: 0.5 10*3/uL (ref 0.1–1.0)
Monocytes Relative: 12 %
Neutro Abs: 2.6 10*3/uL (ref 1.7–7.7)
Neutrophils Relative %: 58 %
Platelets: 177 10*3/uL (ref 150–400)
RBC: 3.03 MIL/uL — ABNORMAL LOW (ref 4.22–5.81)
RDW: 13.4 % (ref 11.5–15.5)
WBC: 4.4 10*3/uL (ref 4.0–10.5)
nRBC: 0 % (ref 0.0–0.2)

## 2020-11-23 LAB — URINALYSIS, ROUTINE W REFLEX MICROSCOPIC
Bilirubin Urine: NEGATIVE
Glucose, UA: NEGATIVE mg/dL
Hgb urine dipstick: NEGATIVE
Ketones, ur: NEGATIVE mg/dL
Leukocytes,Ua: NEGATIVE
Nitrite: NEGATIVE
Protein, ur: NEGATIVE mg/dL
Specific Gravity, Urine: 1.014 (ref 1.005–1.030)
pH: 5 (ref 5.0–8.0)

## 2020-11-23 LAB — HEPATIC FUNCTION PANEL
ALT: 15 U/L (ref 0–44)
AST: 18 U/L (ref 15–41)
Albumin: 3.7 g/dL (ref 3.5–5.0)
Alkaline Phosphatase: 66 U/L (ref 38–126)
Bilirubin, Direct: <0.1 mg/dL (ref 0.0–0.2)
Total Bilirubin: 0.4 mg/dL (ref 0.3–1.2)
Total Protein: 8.2 g/dL — ABNORMAL HIGH (ref 6.5–8.1)

## 2020-11-23 LAB — BASIC METABOLIC PANEL
Anion gap: 6 (ref 5–15)
BUN: 20 mg/dL (ref 8–23)
CO2: 26 mmol/L (ref 22–32)
Calcium: 8.5 mg/dL — ABNORMAL LOW (ref 8.9–10.3)
Chloride: 106 mmol/L (ref 98–111)
Creatinine, Ser: 0.9 mg/dL (ref 0.61–1.24)
GFR, Estimated: >60 mL/min (ref >60)
Glucose, Bld: 137 mg/dL — ABNORMAL HIGH (ref 70–99)
Potassium: 3.4 mmol/L — ABNORMAL LOW (ref 3.5–5.1)
Sodium: 138 mmol/L (ref 135–145)

## 2020-11-23 LAB — RESP PANEL BY RT-PCR (FLU A&B, COVID) ARPGX2
Influenza A by PCR: NEGATIVE
Influenza B by PCR: NEGATIVE
SARS Coronavirus 2 by RT PCR: NEGATIVE

## 2020-11-23 LAB — TROPONIN I (HIGH SENSITIVITY)
Troponin I (High Sensitivity): 3 ng/L (ref <18)
Troponin I (High Sensitivity): 5 ng/L (ref <18)

## 2020-11-23 LAB — D-DIMER, QUANTITATIVE: D-Dimer, Quant: 0.57 ug/mL-FEU — ABNORMAL HIGH (ref 0.00–0.50)

## 2020-11-23 LAB — BRAIN NATRIURETIC PEPTIDE: B Natriuretic Peptide: 75.2 pg/mL (ref 0.0–100.0)

## 2020-11-23 MED ORDER — IOHEXOL 350 MG/ML SOLN
100.0000 mL | Freq: Once | INTRAVENOUS | Status: AC | PRN
  Administered 2020-11-23: 100 mL via INTRAVENOUS

## 2020-11-23 MED ORDER — SODIUM CHLORIDE 0.9 % IV BOLUS
1000.0000 mL | Freq: Once | INTRAVENOUS | Status: AC
  Administered 2020-11-23: 1000 mL via INTRAVENOUS

## 2020-11-23 NOTE — ED Notes (Signed)
Provider at the bedside to evaluate. 

## 2020-11-23 NOTE — ED Notes (Signed)
Pt to xray at this time.

## 2020-11-23 NOTE — ED Provider Notes (Signed)
[facility] DEPT Provider Note   CSN: [id number] Arrival date & time: 11/23/20  1537     History CC:  Weakness  Justin [name] is a 87 y.o. male with history of chronic anemia, TIA, on aspirin 81 mg daily, chronic kidney disease, presenting to the emergency department with weakness.  Patient reports that he woke up in his usual state of health today.  He noted that throughout the morning he began to feel extremely weak.  He felt like his legs were heavy and that he could not perform his daily activities.  His wife who is present at bedside reports that he is extremely active daily.  Today she noted that he was standing in the yard holding onto a tree.  She said this is very unusual.  The patient continues to feel weak laying in bed.  He denies to me lightheadedness or vertigo or dizziness.  He denies that he had any chest pain or difficulty breathing or shortness of breath with his symptoms earlier today.  He denies any recent fevers, chills, cough, congestion, nausea, vomiting, diarrhea.  He reports he has had 3 doses of the COVID-vaccine.  He denies any history of MI, cardiac disease.  He has chronic anemia and gets vitamin b12 injections.  He denies any recent black or tarry stools or blood in the stool.  After arrival in the ED, he reported suddenly sharp, stabbing pain in his occipital scalp and right posterior neck that began abruptly, associated with a minor posterior headache.  He's never had this pain before.  Denies current lightheadedness, vertigo, or visual deficits.  HPI     Past Medical History:  Diagnosis Date  . Anemia   . Basal cell cancer    Dr Wilhemina Bonito  . Benign prostatic hypertrophy    Dr Amalia Hailey  . Carpal tunnel syndrome 10/19/2015   Right  . Chronic kidney disease   . Colon polyp   . Diverticulosis   . Hyperlipidemia   . Kidney stones   . Shingles   . Stroke ([place])   . Transient ischemic attack    Dr Leonie Man    Patient  Active Problem List   Diagnosis Date Noted  . Dysuria 04/21/2020  . Left hip pain 04/21/2020  . Acute pain of right knee 10/01/2019  . Acute midline low back pain without sciatica 05/26/2019  . Dizziness 04/26/2017  . Dehydration 04/26/2017  . Allergic reaction caused by a drug 04/03/2017  . Preventative health care 10/27/2016  . Left flank pain 04/23/2016  . Hypothyroidism 10/25/2015  . Carpal tunnel syndrome 10/19/2015  . Pain, joint, hand, right 08/22/2015  . B12 deficiency 12/31/2011  . Benign prostatic hyperplasia with urinary obstruction 09/25/2011  . SKIN CANCER, HX OF 03/31/2010  . DIVERTICULOSIS, COLON 05/10/2008  . Hx of TIA (transient ischemic attack) and stroke 05/10/2008  . History of colonic polyps 05/10/2008  . NEPHROLITHIASIS, HX OF 05/10/2008  . Hyperlipidemia LDL goal <100 09/24/2007  . Anemia 12/25/2006    Past Surgical History:  Procedure Laterality Date  . CATARACT EXTRACTION, BILATERAL    . COLONOSCOPY W/ POLYPECTOMY  01/2009   adenomatous, Dr. Alben Spittle  . HEMORRHOID SURGERY    . LITHOTRIPSY      X 2;DrAmalia Hailey  . PROSTATE BIOPSY     X2       Family History  Problem Relation Age of Onset  . Heart failure Father   . Heart attack Father 71  . Diabetes Mother   .  Colon cancer Mother   . Prostate cancer Brother   . Stroke Brother   . Coronary artery disease Brother        7 vessel CBAG  . Diabetes Brother   . Heart attack Other        2 P uncles , both > 55  . Stroke Brother        in late 89s  . Colon polyps Sister   . Heart attack Brother   . Other Sister        brain hemorrhage due to a fall    Social History   Tobacco Use  . Smoking status: Former Smoker    Types: Cigarettes, Pipe, Cigars    Quit date: 07/23/1976    Years since quitting: 44.3  . Smokeless tobacco: Former Systems developer    Types: Chew  . Tobacco comment: smoked 1943-1978, up to < 1 ppd  Substance Use Topics  . Alcohol use: No    Alcohol/week: 1.0 standard drink     Types: 1 Glasses of wine per week    Comment: rare wine  . Drug use: No    Home Medications Prior to Admission medications   Medication Sig Start Date End Date Taking? Authorizing Provider  aspirin 81 MG tablet Take 81 mg by mouth daily.   Yes [provider]  finasteride (PROSCAR) 5 MG tablet Take 1 tablet (5 mg total) by mouth daily. 05/24/20  Yes Roma Schanz R, DO  simvastatin (ZOCOR) 20 MG tablet 1 po qd Patient taking differently: Take 20 mg by mouth daily. 1 po qd 05/24/20  Yes Lowne Lyndal Pulley R, DO  amoxicillin (AMOXIL) 875 MG tablet Take 1 tablet (875 mg total) by mouth 2 (two) times daily. Patient not taking: No sig reported 08/12/20   Roma Schanz R, DO    Allergies    Prednisone, Inderal [propranolol], and Omeprazole  Review of Systems   Review of Systems  Constitutional: Positive for fatigue. Negative for chills and fever.  HENT: Negative for ear pain and sore throat.   Eyes: Negative for pain and visual disturbance.  Respiratory: Negative for cough and shortness of breath.   Cardiovascular: Negative for chest pain and palpitations.  Gastrointestinal: Negative for abdominal pain, nausea and vomiting.  Genitourinary: Negative for dysuria and hematuria.  Musculoskeletal: Positive for neck pain. Negative for arthralgias.  Skin: Negative for color change and rash.  Neurological: Positive for weakness and headaches. Negative for syncope, light-headedness and numbness.  All other systems reviewed and are negative.   Physical Exam Updated Vital Signs BP (!) 167/83   Pulse 72   Temp 98.6 F (37 C) (Oral)   Resp 17   Ht 5\' 11"  (1.803 m)   Wt 84.6 kg   SpO2 99%   BMI 26.01 kg/m   Physical Exam Constitutional:      General: He is not in acute distress. HENT:     Head: Normocephalic and atraumatic.  Eyes:     Conjunctiva/sclera: Conjunctivae normal.     Pupils: Pupils are equal, round, and reactive to light.  Neck:     Comments:  Cervical muscle tenderness Cardiovascular:     Rate and Rhythm: Normal rate and regular rhythm.  Pulmonary:     Effort: Pulmonary effort is normal. No respiratory distress.  Abdominal:     General: There is no distension.     Tenderness: There is no abdominal tenderness.  Skin:    General: Skin is warm and dry.  Neurological:     General: No focal deficit present.     Mental Status: He is alert. Mental status is at baseline.     Sensory: No sensory deficit.     Motor: No weakness.  Psychiatric:        Mood and Affect: Mood normal.        Behavior: Behavior normal.     ED Results / Procedures / Treatments   Labs (all labs ordered are listed, but only abnormal results are displayed) Labs Reviewed  BASIC METABOLIC PANEL - Abnormal; Notable for the following components:      Result Value   Potassium 3.4 (*)    Glucose, Bld 137 (*)    Calcium 8.5 (*)    All other components within normal limits  CBC WITH DIFFERENTIAL/PLATELET - Abnormal; Notable for the following components:   RBC 3.03 (*)    Hemoglobin 10.8 (*)    HCT 31.0 (*)    MCV 102.3 (*)    MCH 35.6 (*)    All other components within normal limits  HEPATIC FUNCTION PANEL - Abnormal; Notable for the following components:   Total Protein 8.2 (*)    All other components within normal limits  D-DIMER, QUANTITATIVE - Abnormal; Notable for the following components:   D-Dimer, Quant 0.57 (*)    All other components within normal limits  RESP PANEL BY RT-PCR (FLU A&B, COVID) ARPGX2  BRAIN NATRIURETIC PEPTIDE  URINALYSIS, ROUTINE W REFLEX MICROSCOPIC  TROPONIN I (HIGH SENSITIVITY)  TROPONIN I (HIGH SENSITIVITY)    EKG EKG Interpretation  Date/Time:  Wednesday Nov 23 2020 16:14:29 EDT Ventricular Rate:  65 PR Interval:  189 QRS Duration: 97 QT Interval:  437 QTC Calculation: 455 R Axis:   61 Text Interpretation: Sinus rhythm Confirmed by Octaviano Glow [phone]) on 11/23/2020 4:18:19 PM   Radiology CT Angio Head  W or Wo Contrast  Result Date: 11/23/2020 CLINICAL DATA:  Weakness and new onset inability to walk. EXAM: CT ANGIOGRAPHY HEAD AND NECK TECHNIQUE: Multidetector CT imaging of the head and neck was performed using the standard protocol during bolus administration of intravenous contrast. Multiplanar CT image reconstructions and MIPs were obtained to evaluate the vascular anatomy. Carotid stenosis measurements (when applicable) are obtained utilizing NASCET criteria, using the distal internal carotid diameter as the denominator. CONTRAST:  160mL OMNIPAQUE IOHEXOL 350 MG/ML SOLN COMPARISON:  MRA head 04/07/2007 FINDINGS: CT HEAD FINDINGS Brain: There is no mass, hemorrhage or extra-axial collection. There is generalized atrophy without lobar predilection. There is hypoattenuation of the periventricular white matter, most commonly indicating chronic ischemic microangiopathy. Skull: The visualized skull base, calvarium and extracranial soft tissues are normal. Sinuses/Orbits: No fluid levels or advanced mucosal thickening of the visualized paranasal sinuses. No mastoid or middle ear effusion. The orbits are normal. CTA NECK FINDINGS SKELETON: There is no bony spinal canal stenosis. No lytic or blastic lesion. OTHER NECK: Normal pharynx, larynx and major salivary glands. No cervical lymphadenopathy. Unremarkable thyroid gland. UPPER CHEST: Emphysema AORTIC ARCH: There is no calcific atherosclerosis of the aortic arch. There is no aneurysm, dissection or hemodynamically significant stenosis of the visualized portion of the aorta. Conventional 3 vessel aortic branching pattern. The visualized proximal subclavian arteries are widely patent. RIGHT CAROTID SYSTEM: Normal without aneurysm, dissection or stenosis. LEFT CAROTID SYSTEM: Normal without aneurysm, dissection or stenosis. VERTEBRAL ARTERIES: Left dominant configuration. Both origins are clearly patent. There is no dissection, occlusion or flow-limiting stenosis to the  skull base (V1-V3 segments). CTA HEAD  FINDINGS POSTERIOR CIRCULATION: --Vertebral arteries: Normal V4 segments. --Inferior cerebellar arteries: Normal. --Basilar artery: Normal. --Superior cerebellar arteries: Normal. --Posterior cerebral arteries (PCA): Normal. ANTERIOR CIRCULATION: --Intracranial internal carotid arteries: Normal. --Anterior cerebral arteries (ACA): Normal. Both A1 segments are present. Patent anterior communicating artery (a-comm). --Middle cerebral arteries (MCA): Normal. VENOUS SINUSES: As permitted by contrast timing, patent. ANATOMIC VARIANTS: Fetal origin of the left posterior cerebral artery. Review of the MIP images confirms the above findings. IMPRESSION: 1. No emergent large vessel occlusion or high-grade stenosis of the intracranial or cervical arteries. 2. Chronic ischemic microangiopathy. Emphysema (ICD10-J43.9). Electronically Signed   By: Ulyses Jarred M.D.   On: 11/23/2020 19:32   CT Angio Neck W and/or Wo Contrast  Result Date: 11/23/2020 CLINICAL DATA:  Weakness and new onset inability to walk. EXAM: CT ANGIOGRAPHY HEAD AND NECK TECHNIQUE: Multidetector CT imaging of the head and neck was performed using the standard protocol during bolus administration of intravenous contrast. Multiplanar CT image reconstructions and MIPs were obtained to evaluate the vascular anatomy. Carotid stenosis measurements (when applicable) are obtained utilizing NASCET criteria, using the distal internal carotid diameter as the denominator. CONTRAST:  140mL OMNIPAQUE IOHEXOL 350 MG/ML SOLN COMPARISON:  MRA head 04/07/2007 FINDINGS: CT HEAD FINDINGS Brain: There is no mass, hemorrhage or extra-axial collection. There is generalized atrophy without lobar predilection. There is hypoattenuation of the periventricular white matter, most commonly indicating chronic ischemic microangiopathy. Skull: The visualized skull base, calvarium and extracranial soft tissues are normal. Sinuses/Orbits: No fluid  levels or advanced mucosal thickening of the visualized paranasal sinuses. No mastoid or middle ear effusion. The orbits are normal. CTA NECK FINDINGS SKELETON: There is no bony spinal canal stenosis. No lytic or blastic lesion. OTHER NECK: Normal pharynx, larynx and major salivary glands. No cervical lymphadenopathy. Unremarkable thyroid gland. UPPER CHEST: Emphysema AORTIC ARCH: There is no calcific atherosclerosis of the aortic arch. There is no aneurysm, dissection or hemodynamically significant stenosis of the visualized portion of the aorta. Conventional 3 vessel aortic branching pattern. The visualized proximal subclavian arteries are widely patent. RIGHT CAROTID SYSTEM: Normal without aneurysm, dissection or stenosis. LEFT CAROTID SYSTEM: Normal without aneurysm, dissection or stenosis. VERTEBRAL ARTERIES: Left dominant configuration. Both origins are clearly patent. There is no dissection, occlusion or flow-limiting stenosis to the skull base (V1-V3 segments). CTA HEAD FINDINGS POSTERIOR CIRCULATION: --Vertebral arteries: Normal V4 segments. --Inferior cerebellar arteries: Normal. --Basilar artery: Normal. --Superior cerebellar arteries: Normal. --Posterior cerebral arteries (PCA): Normal. ANTERIOR CIRCULATION: --Intracranial internal carotid arteries: Normal. --Anterior cerebral arteries (ACA): Normal. Both A1 segments are present. Patent anterior communicating artery (a-comm). --Middle cerebral arteries (MCA): Normal. VENOUS SINUSES: As permitted by contrast timing, patent. ANATOMIC VARIANTS: Fetal origin of the left posterior cerebral artery. Review of the MIP images confirms the above findings. IMPRESSION: 1. No emergent large vessel occlusion or high-grade stenosis of the intracranial or cervical arteries. 2. Chronic ischemic microangiopathy. Emphysema (ICD10-J43.9). Electronically Signed   By: Ulyses Jarred M.D.   On: 11/23/2020 19:32   DG Chest Portable 1 View  Result Date: 11/23/2020 CLINICAL  DATA:  Pt and pt wife c/o sudden onset weakness and dizziness. Pt denies chest pain. Pt c/o SOB with the weakness. No slurred speech. Pt wife states pt walks at baseline but unable to walk at this time. EXAM: PORTABLE CHEST 1 VIEW COMPARISON:  12/13/2015. FINDINGS: Cardiac silhouette is top-normal in size. No mediastinal or hilar masses. Lungs show diffuse bilateral interstitial thickening similar to the prior exam. No evidence of pneumonia or  pulmonary edema. No pleural effusion or pneumothorax. Skeletal structures are grossly intact. IMPRESSION: No acute cardiopulmonary disease. Electronically Signed   By: Lajean Manes M.D.   On: 11/23/2020 16:59    Procedures Procedures   Medications Ordered in ED Medications  sodium chloride 0.9 % bolus 1,000 mL (0 mLs Intravenous Stopped 11/23/20 2117)  iohexol (OMNIPAQUE) 350 MG/ML injection 100 mL (100 mLs Intravenous Contrast Given 11/23/20 1902)    ED Course  I have reviewed the triage vital signs and the nursing notes.  Pertinent labs & imaging results that were available during my care of the patient were reviewed by me and considered in my medical decision making (see chart for details).  This patient complains of weakness, neck pain.  This involves an extensive number of treatment options, and is a complaint that carries with it a high risk of complications and morbidity.  The differential diagnosis includes infection vs dehydration vs anemia vs ACS vs CVS/vascular neck injury vs other  No focal stroke symptoms on exam, but neck pain and headache raises concern for vertebral or other vascular injury in the neck.  I ordered, reviewed, and interpreted labs.  UA without sign of infection.  BMP and LFT unremarkable.  BNP 75.  Trop 3 ->5.  Less likely ACS.  Doubt PE clinically - Ddimer normal within age adjustment, and no hypoxia or chest pain or respiratory complaints.  Hgb 10.8 , doubt anemia.  Wbc 4.4, with no focal nidus of infection.  Doubt sepsis. DG  chest reviewed without sign of PNA.  CTA head and neck reviewed with no significant pathology noted I ordered medication IV fluids for hydration Additional history was obtained from patient's wife at bedside   Clinical Course as of 11/24/20 1117  Wed Nov 23, 2020  1936 CT angio ordered as patient complained of sudden severe pain in right lateral/posterior neck today, which has never happened before, but resolved.   [MT]  1941 CTA reviewed and benign.  You are pending a second troponin.  If negative anticipate discharge. [MT]  1941 Ddimer minor elevation but normal with age adjustment.  No CP or hypoxia.  Doubt PE [MT]  2053 [place] negative.  At this point his work-up has been extremely unremarkable.  I have a low suspicion for COVID, sepsis, infection, heart failure, PE, stroke.  He is able to ambulate in the ED.  I think it is reasonable to discharge him with close follow-up with his PCP.  His daughter is here at the bedside to take him home and watch him. [MT]    Clinical Course User Index [MT] Bentzion Dauria, Carola Rhine, MD    Final Clinical Impression(s) / ED Diagnoses Final diagnoses:  Weakness    Rx / DC Orders ED Discharge Orders    None       Wyvonnia Dusky, MD 11/24/20 [phone]

## 2020-11-23 NOTE — Discharge Instructions (Signed)
Your work-up in the ER today was very reassuring.  Specifically, we do not see signs of infection, anemia, heart attack, stroke, or other life-threatening emergencies.  I am not sure what was causing your weakness today.  Advised that you take it easy for the next few days, take your time getting up.  Take your time walking.  Make sure you are always around another family member.  Call your primary care doctor's office tomorrow and ask for follow-up appointment either at the end of the week, or early next week.  Keep drinking lots of water at home.  If you begin to have chest pain, lightheadedness, feel like passing out, or have worsening weakness in your legs (eg. Cannot walk, or feel like falling), call 911 and return to the ER.  These may be signs of another serious medical condition that were not picked up on our work-up in the ER today.

## 2020-11-23 NOTE — ED Triage Notes (Signed)
Pt and pt wife c/o sudden onset weakness and dizziness. Pt denies chest pain. Pt c/o SOB with the weakness. No slurred speech. Pt wife states pt walks at baseline but unable to walk at this time.

## 2020-11-23 NOTE — ED Notes (Signed)
Pt A&O x4. Attached to cardiac monitor x3. VSS. Follows commands appropriately.

## 2020-11-29 ENCOUNTER — Inpatient Hospital Stay: Payer: PPO | Admitting: Family Medicine

## 2020-12-05 ENCOUNTER — Ambulatory Visit (INDEPENDENT_AMBULATORY_CARE_PROVIDER_SITE_OTHER): Payer: PPO | Admitting: Family Medicine

## 2020-12-05 ENCOUNTER — Other Ambulatory Visit: Payer: Self-pay

## 2020-12-05 ENCOUNTER — Encounter: Payer: Self-pay | Admitting: Family Medicine

## 2020-12-05 ENCOUNTER — Other Ambulatory Visit: Payer: Self-pay | Admitting: Family Medicine

## 2020-12-05 VITALS — BP 120/78 | HR 73 | Temp 98.1°F | Resp 18 | Ht 71.0 in | Wt 187.8 lb

## 2020-12-05 DIAGNOSIS — R351 Nocturia: Secondary | ICD-10-CM | POA: Diagnosis not present

## 2020-12-05 DIAGNOSIS — E538 Deficiency of other specified B group vitamins: Secondary | ICD-10-CM | POA: Diagnosis not present

## 2020-12-05 DIAGNOSIS — N401 Enlarged prostate with lower urinary tract symptoms: Secondary | ICD-10-CM | POA: Diagnosis not present

## 2020-12-05 DIAGNOSIS — N138 Other obstructive and reflux uropathy: Secondary | ICD-10-CM | POA: Diagnosis not present

## 2020-12-05 DIAGNOSIS — D649 Anemia, unspecified: Secondary | ICD-10-CM

## 2020-12-05 DIAGNOSIS — E785 Hyperlipidemia, unspecified: Secondary | ICD-10-CM | POA: Diagnosis not present

## 2020-12-05 DIAGNOSIS — R531 Weakness: Secondary | ICD-10-CM

## 2020-12-05 DIAGNOSIS — E039 Hypothyroidism, unspecified: Secondary | ICD-10-CM

## 2020-12-05 DIAGNOSIS — D509 Iron deficiency anemia, unspecified: Secondary | ICD-10-CM

## 2020-12-05 LAB — CBC WITH DIFFERENTIAL/PLATELET
Basophils Absolute: 0.1 10*3/uL (ref 0.0–0.1)
Basophils Relative: 0.8 % (ref 0.0–3.0)
Eosinophils Absolute: 0.1 10*3/uL (ref 0.0–0.7)
Eosinophils Relative: 2 % (ref 0.0–5.0)
HCT: 30.8 % — ABNORMAL LOW (ref 39.0–52.0)
Hemoglobin: 10.8 g/dL — ABNORMAL LOW (ref 13.0–17.0)
Lymphocytes Relative: 17.5 % (ref 12.0–46.0)
Lymphs Abs: 1.1 10*3/uL (ref 0.7–4.0)
MCHC: 35 g/dL (ref 30.0–36.0)
MCV: 102.6 fl — ABNORMAL HIGH (ref 78.0–100.0)
Monocytes Absolute: 0.7 10*3/uL (ref 0.1–1.0)
Monocytes Relative: 11 % (ref 3.0–12.0)
Neutro Abs: 4.4 10*3/uL (ref 1.4–7.7)
Neutrophils Relative %: 68.7 % (ref 43.0–77.0)
Platelets: 195 10*3/uL (ref 150.0–400.0)
RBC: 3 Mil/uL — ABNORMAL LOW (ref 4.22–5.81)
RDW: 13.2 % (ref 11.5–15.5)
WBC: 6.4 10*3/uL (ref 4.0–10.5)

## 2020-12-05 LAB — COMPREHENSIVE METABOLIC PANEL
ALT: 9 U/L (ref 0–53)
AST: 13 U/L (ref 0–37)
Albumin: 3.6 g/dL (ref 3.5–5.2)
Alkaline Phosphatase: 66 U/L (ref 39–117)
BUN: 19 mg/dL (ref 6–23)
CO2: 27 mEq/L (ref 19–32)
Calcium: 8.1 mg/dL — ABNORMAL LOW (ref 8.4–10.5)
Chloride: 106 mEq/L (ref 96–112)
Creatinine, Ser: 0.8 mg/dL (ref 0.40–1.50)
GFR: 76.31 mL/min (ref >60.00)
Glucose, Bld: 106 mg/dL — ABNORMAL HIGH (ref 70–99)
Potassium: 3.8 mEq/L (ref 3.5–5.1)
Sodium: 139 mEq/L (ref 135–145)
Total Bilirubin: 0.4 mg/dL (ref 0.2–1.2)
Total Protein: 7.8 g/dL (ref 6.0–8.3)

## 2020-12-05 LAB — VITAMIN B12: Vitamin B-12: 308 pg/mL (ref 211–911)

## 2020-12-05 LAB — PSA: PSA: 1.51 ng/mL (ref 0.10–4.00)

## 2020-12-05 LAB — LIPID PANEL
Cholesterol: 109 mg/dL (ref 0–200)
HDL: 41.7 mg/dL (ref >39.00)
NonHDL: 67.07
Total CHOL/HDL Ratio: 3
Triglycerides: 204 mg/dL — ABNORMAL HIGH (ref 0.0–149.0)
VLDL: 40.8 mg/dL — ABNORMAL HIGH (ref 0.0–40.0)

## 2020-12-05 LAB — IBC + FERRITIN
Ferritin: 40.4 ng/mL (ref 22.0–322.0)
Iron: 84 ug/dL (ref 42–165)
Saturation Ratios: 31.1 % (ref 20.0–50.0)
Transferrin: 193 mg/dL — ABNORMAL LOW (ref 212.0–360.0)

## 2020-12-05 LAB — LDL CHOLESTEROL, DIRECT: Direct LDL: 49 mg/dL

## 2020-12-05 LAB — TSH: TSH: 8.05 u[IU]/mL — ABNORMAL HIGH (ref 0.35–4.50)

## 2020-12-05 NOTE — Assessment & Plan Note (Signed)
Recheck labs today Check ifob 

## 2020-12-05 NOTE — Patient Instructions (Signed)
[employer] medicine (25th ed., pp. [phone]). [place], [place]: Elsevier.">  Anemia  Anemia is a condition in which there is not enough red blood cells or hemoglobin in the blood. Hemoglobin is a substance in red blood cells that carries oxygen. When you do not have enough red blood cells or hemoglobin (are anemic), your body cannot get enough oxygen and your organs may not work properly. As a result, you may feel very tired or have other problems. What are the causes? Common causes of anemia include:  Excessive bleeding. Anemia can be caused by excessive bleeding inside or outside the body, including bleeding from the intestines or from heavy menstrual periods in females.  Poor nutrition.  Long-lasting (chronic) kidney, thyroid, and liver disease.  Bone marrow disorders, spleen problems, and blood disorders.  Cancer and treatments for cancer.  HIV (human immunodeficiency virus) and AIDS (acquired immunodeficiency syndrome).  Infections, medicines, and autoimmune disorders that destroy red blood cells. What are the signs or symptoms? Symptoms of this condition include:  Minor weakness.  Dizziness.  Headache, or difficulties concentrating and sleeping.  Heartbeats that feel irregular or faster than normal (palpitations).  Shortness of breath, especially with exercise.  Pale skin, lips, and nails, or cold hands and feet.  Indigestion and nausea. Symptoms may occur suddenly or develop slowly. If your anemia is mild, you may not have symptoms. How is this diagnosed? This condition is diagnosed based on blood tests, your medical history, and a physical exam. In some cases, a test may be needed in which cells are removed from the soft tissue inside of a bone and looked at under a microscope (bone marrow biopsy). Your health care provider may also check your stool (feces) for blood and may do additional testing to look for the cause of your bleeding. Other tests may  include:  Imaging tests, such as a CT scan or MRI.  A procedure to see inside your esophagus and stomach (endoscopy).  A procedure to see inside your colon and rectum (colonoscopy). How is this treated? Treatment for this condition depends on the cause. If you continue to lose a lot of blood, you may need to be treated at a hospital. Treatment may include:  Taking supplements of iron, vitamin Q68, or folic acid.  Taking a hormone medicine (erythropoietin) that can help to stimulate red blood cell growth.  Having a blood transfusion. This may be needed if you lose a lot of blood.  Making changes to your diet.  Having surgery to remove your spleen. Follow these instructions at home:  Take over-the-counter and prescription medicines only as told by your health care provider.  Take supplements only as told by your health care provider.  Follow any diet instructions that you were given by your health care provider.  Keep all follow-up visits as told by your health care provider. This is important. Contact a health care provider if:  You develop new bleeding anywhere in the body. Get help right away if:  You are very weak.  You are short of breath.  You have pain in your abdomen or chest.  You are dizzy or feel faint.  You have trouble concentrating.  You have bloody stools, black stools, or tarry stools.  You vomit repeatedly or you vomit up blood. These symptoms may represent a serious problem that is an emergency. Do not wait to see if the symptoms will go away. Get medical help right away. Call your local emergency services (911 in the [place] Do not  drive yourself to the hospital. Summary  Anemia is a condition in which you do not have enough red blood cells or enough of a substance in your red blood cells that carries oxygen (hemoglobin).  Symptoms may occur suddenly or develop slowly.  If your anemia is mild, you may not have symptoms.  This condition is  diagnosed with blood tests, a medical history, and a physical exam. Other tests may be needed.  Treatment for this condition depends on the cause of the anemia. This information is not intended to replace advice given to you by your health care provider. Make sure you discuss any questions you have with your health care provider. Document Revised: 06/16/2019 Document Reviewed: 06/16/2019 Elsevier Patient Education  2021 [employer].  

## 2020-12-05 NOTE — Assessment & Plan Note (Signed)
Pt requesting psa  No new symptoms

## 2020-12-05 NOTE — Progress Notes (Signed)
Patient ID: Justin [name], male    DOB: [birth year]  Age: 87 y.o. MRN: [id number]    Subjective:  Subjective  HPI Justin [name] presents for a hospital FU. The pt was admitted to the ED on 11/23/2020 for weakness and dizziness. He was discharged on 11/23/2020 and was Dx with weakness. 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 He complains vertigo. He states when he looks up or at objects he experiences the room spinning. He also complains of lack of energy. He denies any chest pain, SOB, fever, abdominal pain, cough, chills, sore throat, dysuria, urinary incontinence, back pain, HA, or N/V/D at this time. He notes that he has not been consuming much red meats. He states that he is currently still working on his land.   Review of Systems  Constitutional: Negative for chills, fatigue and fever.       (+) lack of energy    HENT: Negative for ear pain, rhinorrhea, sinus pressure, sinus pain, sore throat and tinnitus.   Eyes: Negative for pain.  Respiratory: Negative for cough, shortness of breath and wheezing.   Cardiovascular: Negative for chest pain.  Gastrointestinal: Negative for abdominal pain, anal bleeding, constipation, diarrhea, nausea and vomiting.  Genitourinary: Negative for flank pain.  Musculoskeletal: Negative for back pain and neck pain.  Skin: Negative for rash.  Neurological: Negative for seizures, light-headedness and numbness.       (+) vertigo      History Past Medical History:  Diagnosis Date  . Anemia   . Basal cell cancer    Dr Wilhemina Bonito  . Benign prostatic hypertrophy    Dr Amalia Hailey  . Carpal tunnel syndrome 10/19/2015   Right  . Chronic kidney disease   . Colon polyp   . Diverticulosis   . Hyperlipidemia   . Kidney stones   . Shingles   . Stroke ([place])   . Transient ischemic attack    Dr Leonie Man    He has a past surgical history that includes Lithotripsy; Hemorrhoid surgery; Colonoscopy w/ polypectomy (01/2009); Cataract extraction, bilateral;  and Prostate biopsy.   His family history includes Colon cancer in his mother; Colon polyps in his sister; Coronary artery disease in his brother; Diabetes in his brother and mother; Heart attack in his brother and another family member; Heart attack (age of onset: 16) in his father; Heart failure in his father; Other in his sister; Prostate cancer in his brother; Stroke in his brother and brother.He reports that he quit smoking about 44 years ago. His smoking use included cigarettes, pipe, and cigars. He has quit using smokeless tobacco.  His smokeless tobacco use included chew. He reports that he does not drink alcohol and does not use drugs.  Current Outpatient Medications on File Prior to Visit  Medication Sig Dispense Refill  . aspirin 81 MG tablet Take 81 mg by mouth daily.    . finasteride (PROSCAR) 5 MG tablet Take 1 tablet (5 mg total) by mouth daily. 90 tablet 1  . simvastatin (ZOCOR) 20 MG tablet 1 po qd (Patient taking differently: Take 20 mg by mouth daily. 1 po qd) 90 tablet 1   Current Facility-Administered Medications on File Prior to Visit  Medication Dose Route Frequency Provider Last Rate Last Admin  . cyanocobalamin ((VITAMIN B-12)) injection 1,000 mcg  1,000 mcg Intramuscular Q30 days Carollee Herter, Kendrick Fries R, DO   1,000 mcg at 10/11/20 1018     Objective:  Objective  Physical Exam Vitals and  nursing note reviewed.  Constitutional:      General: He is not in acute distress.    Appearance: Normal appearance. He is well-developed. He is not ill-appearing.  HENT:     Head: Normocephalic and atraumatic.     Right Ear: External ear normal.     Left Ear: External ear normal.     Nose: Nose normal.  Eyes:     General:        Right eye: No discharge.        Left eye: No discharge.     Extraocular Movements: Extraocular movements intact.     Pupils: Pupils are equal, round, and reactive to light.  Cardiovascular:     Rate and Rhythm: Normal rate and regular rhythm.      Pulses: Normal pulses.     Heart sounds: Normal heart sounds. No murmur heard. No friction rub. No gallop.   Pulmonary:     Effort: Pulmonary effort is normal. No respiratory distress.     Breath sounds: Normal breath sounds. No stridor. No wheezing, rhonchi or rales.  Chest:     Chest wall: No tenderness.  Abdominal:     General: Bowel sounds are normal. There is no distension.     Palpations: Abdomen is soft. There is no mass.     Tenderness: There is no abdominal tenderness. There is no guarding or rebound.     Hernia: No hernia is present.  Musculoskeletal:        General: Normal range of motion.     Cervical back: Normal range of motion and neck supple.     Right lower leg: No edema.     Left lower leg: No edema.  Skin:    General: Skin is warm and dry.  Neurological:     Mental Status: He is alert and oriented to person, place, and time.  Psychiatric:        Behavior: Behavior normal.        Thought Content: Thought content normal.    BP 120/78 (BP Location: Right Arm, Patient Position: Sitting, Cuff Size: Normal)   Pulse 73   Temp 98.1 F (36.7 C) (Oral)   Resp 18   Ht 5\' 11"  (1.803 m)   Wt 187 lb 12.8 oz (85.2 kg)   SpO2 98%   BMI 26.19 kg/m  Wt Readings from Last 3 Encounters:  12/05/20 187 lb 12.8 oz (85.2 kg)  11/23/20 186 lb 8 oz (84.6 kg)  08/16/20 187 lb 2.7 oz (84.9 kg)     Lab Results  Component Value Date   WBC 4.4 11/23/2020   HGB 10.8 (L) 11/23/2020   HCT 31.0 (L) 11/23/2020   PLT 177 11/23/2020   GLUCOSE 137 (H) 11/23/2020   CHOL 128 04/21/2020   TRIG 96 04/21/2020   HDL 51 04/21/2020   LDLCALC 59 04/21/2020   ALT 15 11/23/2020   AST 18 11/23/2020   NA 138 11/23/2020   K 3.4 (L) 11/23/2020   CL 106 11/23/2020   CREATININE 0.90 11/23/2020   BUN 20 11/23/2020   CO2 26 11/23/2020   TSH 3.98 10/06/2018   PSA 1.48 04/21/2020   HGBA1C 5.4 01/10/2016   MICROALBUR 1.3 01/10/2016    CT Angio Head W or Wo Contrast  Result Date:  11/23/2020 CLINICAL DATA:  Weakness and new onset inability to walk. EXAM: CT ANGIOGRAPHY HEAD AND NECK TECHNIQUE: Multidetector CT imaging of the head and neck was performed using the standard protocol during bolus  administration of intravenous contrast. Multiplanar CT image reconstructions and MIPs were obtained to evaluate the vascular anatomy. Carotid stenosis measurements (when applicable) are obtained utilizing NASCET criteria, using the distal internal carotid diameter as the denominator. CONTRAST:  167mL OMNIPAQUE IOHEXOL 350 MG/ML SOLN COMPARISON:  MRA head 04/07/2007 FINDINGS: CT HEAD FINDINGS Brain: There is no mass, hemorrhage or extra-axial collection. There is generalized atrophy without lobar predilection. There is hypoattenuation of the periventricular white matter, most commonly indicating chronic ischemic microangiopathy. Skull: The visualized skull base, calvarium and extracranial soft tissues are normal. Sinuses/Orbits: No fluid levels or advanced mucosal thickening of the visualized paranasal sinuses. No mastoid or middle ear effusion. The orbits are normal. CTA NECK FINDINGS SKELETON: There is no bony spinal canal stenosis. No lytic or blastic lesion. OTHER NECK: Normal pharynx, larynx and major salivary glands. No cervical lymphadenopathy. Unremarkable thyroid gland. UPPER CHEST: Emphysema AORTIC ARCH: There is no calcific atherosclerosis of the aortic arch. There is no aneurysm, dissection or hemodynamically significant stenosis of the visualized portion of the aorta. Conventional 3 vessel aortic branching pattern. The visualized proximal subclavian arteries are widely patent. RIGHT CAROTID SYSTEM: Normal without aneurysm, dissection or stenosis. LEFT CAROTID SYSTEM: Normal without aneurysm, dissection or stenosis. VERTEBRAL ARTERIES: Left dominant configuration. Both origins are clearly patent. There is no dissection, occlusion or flow-limiting stenosis to the skull base (V1-V3 segments). CTA  HEAD FINDINGS POSTERIOR CIRCULATION: --Vertebral arteries: Normal V4 segments. --Inferior cerebellar arteries: Normal. --Basilar artery: Normal. --Superior cerebellar arteries: Normal. --Posterior cerebral arteries (PCA): Normal. ANTERIOR CIRCULATION: --Intracranial internal carotid arteries: Normal. --Anterior cerebral arteries (ACA): Normal. Both A1 segments are present. Patent anterior communicating artery (a-comm). --Middle cerebral arteries (MCA): Normal. VENOUS SINUSES: As permitted by contrast timing, patent. ANATOMIC VARIANTS: Fetal origin of the left posterior cerebral artery. Review of the MIP images confirms the above findings. IMPRESSION: 1. No emergent large vessel occlusion or high-grade stenosis of the intracranial or cervical arteries. 2. Chronic ischemic microangiopathy. Emphysema (ICD10-J43.9). Electronically Signed   By: Ulyses Jarred M.D.   On: 11/23/2020 19:32   CT Angio Neck W and/or Wo Contrast  Result Date: 11/23/2020 CLINICAL DATA:  Weakness and new onset inability to walk. EXAM: CT ANGIOGRAPHY HEAD AND NECK TECHNIQUE: Multidetector CT imaging of the head and neck was performed using the standard protocol during bolus administration of intravenous contrast. Multiplanar CT image reconstructions and MIPs were obtained to evaluate the vascular anatomy. Carotid stenosis measurements (when applicable) are obtained utilizing NASCET criteria, using the distal internal carotid diameter as the denominator. CONTRAST:  188mL OMNIPAQUE IOHEXOL 350 MG/ML SOLN COMPARISON:  MRA head 04/07/2007 FINDINGS: CT HEAD FINDINGS Brain: There is no mass, hemorrhage or extra-axial collection. There is generalized atrophy without lobar predilection. There is hypoattenuation of the periventricular white matter, most commonly indicating chronic ischemic microangiopathy. Skull: The visualized skull base, calvarium and extracranial soft tissues are normal. Sinuses/Orbits: No fluid levels or advanced mucosal thickening  of the visualized paranasal sinuses. No mastoid or middle ear effusion. The orbits are normal. CTA NECK FINDINGS SKELETON: There is no bony spinal canal stenosis. No lytic or blastic lesion. OTHER NECK: Normal pharynx, larynx and major salivary glands. No cervical lymphadenopathy. Unremarkable thyroid gland. UPPER CHEST: Emphysema AORTIC ARCH: There is no calcific atherosclerosis of the aortic arch. There is no aneurysm, dissection or hemodynamically significant stenosis of the visualized portion of the aorta. Conventional 3 vessel aortic branching pattern. The visualized proximal subclavian arteries are widely patent. RIGHT CAROTID SYSTEM: Normal without aneurysm, dissection or  stenosis. LEFT CAROTID SYSTEM: Normal without aneurysm, dissection or stenosis. VERTEBRAL ARTERIES: Left dominant configuration. Both origins are clearly patent. There is no dissection, occlusion or flow-limiting stenosis to the skull base (V1-V3 segments). CTA HEAD FINDINGS POSTERIOR CIRCULATION: --Vertebral arteries: Normal V4 segments. --Inferior cerebellar arteries: Normal. --Basilar artery: Normal. --Superior cerebellar arteries: Normal. --Posterior cerebral arteries (PCA): Normal. ANTERIOR CIRCULATION: --Intracranial internal carotid arteries: Normal. --Anterior cerebral arteries (ACA): Normal. Both A1 segments are present. Patent anterior communicating artery (a-comm). --Middle cerebral arteries (MCA): Normal. VENOUS SINUSES: As permitted by contrast timing, patent. ANATOMIC VARIANTS: Fetal origin of the left posterior cerebral artery. Review of the MIP images confirms the above findings. IMPRESSION: 1. No emergent large vessel occlusion or high-grade stenosis of the intracranial or cervical arteries. 2. Chronic ischemic microangiopathy. Emphysema (ICD10-J43.9). Electronically Signed   By: Ulyses Jarred M.D.   On: 11/23/2020 19:32   DG Chest Portable 1 View  Result Date: 11/23/2020 CLINICAL DATA:  Pt and pt wife c/o sudden onset  weakness and dizziness. Pt denies chest pain. Pt c/o SOB with the weakness. No slurred speech. Pt wife states pt walks at baseline but unable to walk at this time. EXAM: PORTABLE CHEST 1 VIEW COMPARISON:  12/13/2015. FINDINGS: Cardiac silhouette is top-normal in size. No mediastinal or hilar masses. Lungs show diffuse bilateral interstitial thickening similar to the prior exam. No evidence of pneumonia or pulmonary edema. No pleural effusion or pneumothorax. Skeletal structures are grossly intact. IMPRESSION: No acute cardiopulmonary disease. Electronically Signed   By: Lajean Manes M.D.   On: 11/23/2020 16:59     Assessment & Plan:  Plan   No orders of the defined types were placed in this encounter.   Problem List Items Addressed This Visit      Unprioritized   Anemia   Relevant Orders   Fecal occult blood, imunochemical   Vitamin B12    Other Visit Diagnoses    Weakness    -  Primary   Relevant Orders   CBC with Differential/Platelet   IBC + Ferritin   Comprehensive metabolic panel   Vitamin [id number]   TSH   Hyperlipidemia, unspecified hyperlipidemia type       Relevant Orders   Lipid panel   Nocturia       Relevant Orders   PSA      Follow-up: Return in about 3 months (around 03/07/2021), or if symptoms worsen or fail to improve.   I,Gordon Zheng,acting as a Education administrator for [employer], DO.,have documented all relevant documentation on the behalf of [name], DO,as directed by  [name], DO while in the presence of [place], DO, have reviewed all documentation for this visit. The documentation on 12/05/20 for the exam, diagnosis, procedures, and orders are all accurate and complete. [name], DO

## 2020-12-05 NOTE — Assessment & Plan Note (Signed)
Recheck today. 

## 2020-12-05 NOTE — Assessment & Plan Note (Signed)
Check labs 

## 2020-12-05 NOTE — Assessment & Plan Note (Signed)
Tolerating statin, encouraged heart healthy diet, avoid trans fats, minimize simple carbs and saturated fats. Increase exercise as tolerated 

## 2020-12-14 ENCOUNTER — Other Ambulatory Visit (INDEPENDENT_AMBULATORY_CARE_PROVIDER_SITE_OTHER): Payer: PPO

## 2020-12-14 DIAGNOSIS — D649 Anemia, unspecified: Secondary | ICD-10-CM

## 2020-12-14 LAB — FECAL OCCULT BLOOD, IMMUNOCHEMICAL: Fecal Occult Bld: NEGATIVE

## 2020-12-14 NOTE — Addendum Note (Signed)
Addended by: Kelle Darting A on: 12/14/2020 12:23 PM   Modules accepted: Orders

## 2020-12-16 ENCOUNTER — Ambulatory Visit (INDEPENDENT_AMBULATORY_CARE_PROVIDER_SITE_OTHER): Payer: PPO

## 2020-12-16 ENCOUNTER — Other Ambulatory Visit: Payer: Self-pay

## 2020-12-16 DIAGNOSIS — E538 Deficiency of other specified B group vitamins: Secondary | ICD-10-CM | POA: Diagnosis not present

## 2020-12-16 MED ORDER — CYANOCOBALAMIN 1000 MCG/ML IJ SOLN
1000.0000 ug | Freq: Once | INTRAMUSCULAR | Status: AC
  Administered 2020-12-16: 1000 ug via INTRAMUSCULAR

## 2020-12-16 NOTE — Progress Notes (Signed)
Linus Weckerly is a 87 y.o. male presents to the office today for b12 injections, per physician's orders. Original order: 05/24/2020 cyanocobalamin (med), 1,000 (dose),  IM (route) was administered right deltoid (location) today. Patient tolerated injection. Patient due for follow up labs/provider appt:Patient is up to date   Patient next injection due: one month, appt made Yes 01/17/2021  Loleta Chance

## 2021-01-09 ENCOUNTER — Other Ambulatory Visit: Payer: PPO

## 2021-01-17 ENCOUNTER — Other Ambulatory Visit: Payer: Self-pay

## 2021-01-17 ENCOUNTER — Ambulatory Visit (INDEPENDENT_AMBULATORY_CARE_PROVIDER_SITE_OTHER): Payer: PPO

## 2021-01-17 DIAGNOSIS — E538 Deficiency of other specified B group vitamins: Secondary | ICD-10-CM | POA: Diagnosis not present

## 2021-01-17 MED ORDER — CYANOCOBALAMIN 1000 MCG/ML IJ SOLN
1000.0000 ug | Freq: Once | INTRAMUSCULAR | Status: AC
Start: 2021-01-17 — End: 2021-01-17
  Administered 2021-01-17: 1000 ug via INTRAMUSCULAR

## 2021-01-17 NOTE — Progress Notes (Signed)
Justin [name] is a 87 y.o. male presents to the office today for B12:  injections, per physician's orders. Original order: 05-24-2020. Cyanocobalamin  (med), 1000 mcg/ml (dose),  IM (route) was administered Left deltoid (location) today. Patient tolerated injection.  Patient next injection due: in one month, appt made for 02-16-21.   [name]

## 2021-02-07 DIAGNOSIS — L814 Other melanin hyperpigmentation: Secondary | ICD-10-CM | POA: Diagnosis not present

## 2021-02-07 DIAGNOSIS — D225 Melanocytic nevi of trunk: Secondary | ICD-10-CM | POA: Diagnosis not present

## 2021-02-07 DIAGNOSIS — L821 Other seborrheic keratosis: Secondary | ICD-10-CM | POA: Diagnosis not present

## 2021-02-07 DIAGNOSIS — L570 Actinic keratosis: Secondary | ICD-10-CM | POA: Diagnosis not present

## 2021-02-07 DIAGNOSIS — L820 Inflamed seborrheic keratosis: Secondary | ICD-10-CM | POA: Diagnosis not present

## 2021-02-07 DIAGNOSIS — L905 Scar conditions and fibrosis of skin: Secondary | ICD-10-CM | POA: Diagnosis not present

## 2021-02-08 NOTE — Progress Notes (Deleted)
Subjective:   Justin [name] is a 87 y.o. male who presents for Medicare Annual/Subsequent preventive examination.  Review of Systems    ***       Objective:    There were no vitals filed for this visit. There is no height or weight on file to calculate BMI.  Advanced Directives 11/23/2020 08/16/2020 02/09/2020 07/28/2018 06/15/2016 06/08/2016  Does Patient Have a Medical Advance Directive? No No No No No No  Would patient like information on creating a medical advance directive? No - Patient declined No - Patient declined No - Patient declined No - Patient declined - Yes - Educational materials given    Current Medications (verified) Outpatient Encounter Medications as of 02/09/2021  Medication Sig   aspirin 81 MG tablet Take 81 mg by mouth daily.   finasteride (PROSCAR) 5 MG tablet Take 1 tablet (5 mg total) by mouth daily.   simvastatin (ZOCOR) 20 MG tablet 1 po qd (Patient taking differently: Take 20 mg by mouth daily. 1 po qd)   Facility-Administered Encounter Medications as of 02/09/2021  Medication   cyanocobalamin ((VITAMIN B-12)) injection 1,000 mcg    Allergies (verified) Prednisone, Inderal [propranolol], and Omeprazole   History: Past Medical History:  Diagnosis Date   Anemia    Basal cell cancer    Dr Wilhemina Bonito   Benign prostatic hypertrophy    Dr Amalia Hailey   Carpal tunnel syndrome 10/19/2015   Right   Chronic kidney disease    Colon polyp    Diverticulosis    Hyperlipidemia    Kidney stones    Shingles    Stroke [facility])    Transient ischemic attack    Dr Leonie Man   Past Surgical History:  Procedure Laterality Date   CATARACT EXTRACTION, BILATERAL     COLONOSCOPY W/ POLYPECTOMY  01/2009   adenomatous, Dr. Alben Spittle   HEMORRHOID SURGERY     LITHOTRIPSY      X 2;Dr. Amalia Hailey   PROSTATE BIOPSY     X2   Family History  Problem Relation Age of Onset   Heart failure Father    Heart attack Father 22   Diabetes Mother    Colon cancer Mother    Prostate cancer  Brother    Stroke Brother    Coronary artery disease Brother        7 vessel CBAG   Diabetes Brother    Heart attack Other        2 P uncles , both > 18   Stroke Brother        in late 70s   Colon polyps Sister    Heart attack Brother    Other Sister        brain hemorrhage due to a fall   Social History   Socioeconomic History   Marital status: Married    Spouse name: Not on file   Number of children: 4   Years of education: Not on file   Highest education level: Not on file  Occupational History   Occupation: Truck Education administrator: [employer]  Tobacco Use   Smoking status: Former    Types: Cigarettes, Pipe, Cigars    Quit date: 07/23/1976    Years since quitting: 44.5   Smokeless tobacco: Former    Types: Chew   Tobacco comments:    smoked [id number], up to < 1 ppd  Substance and Sexual Activity   Alcohol use: No    Alcohol/week: 1.0 standard  drink    Types: 1 Glasses of wine per week    Comment: rare wine   Drug use: No   Sexual activity: Not on file  Other Topics Concern   Not on file  Social History Narrative   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, 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58   Social Determinants of Radio broadcast assistant Strain: Low Risk    Difficulty of Paying Living Expenses: Not hard at all  Food Insecurity: No Food Insecurity   Worried About Charity fundraiser in the Last Year: Never true   Arboriculturist in the Last Year: Never true  Transportation Needs: No Transportation Needs   Lack of Transportation (Medical): No   Lack of Transportation (Non-Medical): No  Physical Activity: Not on file  Stress: Not on file  Social Connections: Not on file    Tobacco Counseling Counseling given: Not Answered Tobacco comments: smoked 1943-1978, up to < 1 ppd   Clinical Intake:                 Diabetic?No         Activities of Daily Living In your present state of  health, do you have any difficulty performing the following activities: 02/09/2020  Hearing? N  Vision? N  Difficulty concentrating or making decisions? N  Walking or climbing stairs? N  Dressing or bathing? N  Doing errands, shopping? N  Preparing Food and eating ? N  Using the Toilet? N  In the past six months, have you accidently leaked urine? N  Do you have problems with loss of bowel control? N  Managing your Medications? N  Managing your Finances? N  Housekeeping or managing your Housekeeping? N  Some recent data might be hidden    Patient Care Team: Carollee Herter, Alferd Apa, DO as PCP - General (Family Medicine) Domingo Pulse, MD (Urology) Rutherford Guys, MD as Consulting Physician (Ophthalmology) Macario Carls, MD as Referring Physician (Dermatology)  Indicate any recent Medical Services you may have received from other than [facility] providers in the past year (date may be approximate).     Assessment:   This is a routine wellness examination for Justin [name].  Hearing/Vision screen No results found.  Dietary issues and exercise activities discussed:     Goals Addressed   None    Depression Screen PHQ 2/9 Scores 02/09/2020 07/28/2018 10/26/2016 12/09/2015 02/28/2015 02/25/2015 06/12/2013  PHQ - 2 Score 0 0 0 0 0 0 0  Exception Documentation - - - Patient refusal - - -    Fall Risk Fall Risk  02/09/2020 10/01/2019 07/28/2018 10/26/2016 12/09/2015  Falls in the past year? 0 1 0 Yes Yes  Number falls in past yr: 0 0 - 1 1  Injury with Fall? 0 0 - Yes Yes  Comment - - - pt report he tripped over a curb at [place], pt report he injuried by knees -  Risk Factor Category  - - - - High Fall Risk  Risk for fall due to : - - - - Impaired balance/gait  Follow up Education provided;Falls prevention discussed Falls evaluation completed - - Falls prevention discussed    FALL RISK PREVENTION PERTAINING TO THE HOME:  Any stairs in or around the home? {YES/NO:21197} If so, are there any without  handrails? {YES/NO:21197} Home free of loose throw rugs in walkways, pet beds, electrical cords, etc? {YES/NO:21197} Adequate lighting  in your home to reduce risk of falls? {YES/NO:21197}  ASSISTIVE DEVICES UTILIZED TO PREVENT FALLS:  Life alert? {YES/NO:21197} Use of a cane, walker or w/c? {YES/NO:21197} Grab bars in the bathroom? {YES/NO:21197} Shower chair or bench in shower? {YES/NO:21197} Elevated toilet seat or a handicapped toilet? {YES/NO:21197}  TIMED UP AND GO:  Was the test performed? {YES/NO:21197}.  Length of time to ambulate 10 feet: *** sec.   {Appearance of Gait:2101803}  Cognitive Function: MMSE - Mini Mental State Exam 07/28/2018  Orientation to time 5  Orientation to Place 5  Registration 3  Attention/ Calculation 5  Recall 2  Language- name 2 objects 2  Language- repeat 1  Language- follow 3 step command 3  Language- read & follow direction 1  Write a sentence 1  Copy design 1  Total score 29     6CIT Screen 02/09/2020  What Year? 0 points  What month? 0 points  What time? 0 points  Count back from 20 0 points  Months in reverse 0 points  Repeat phrase 2 points  Total Score 2    Immunizations Immunization History  Administered Date(s) Administered   Fluad Quad(high Dose 65+) 05/22/2019, 05/19/2020   Influenza Split 05/25/2011, 05/23/2012   Influenza Whole 05/27/2007, 05/26/2008, 06/13/2009, 05/23/2010   Influenza, High Dose Seasonal PF 05/25/2015, 06/08/2016, 04/26/2017, 05/28/2018   Influenza, Seasonal, Injecte, Preservative Fre 05/25/2013   Influenza,inj,Quad PF,6+ Mos 05/10/2014   PFIZER(Purple Top)SARS-COV-2 Vaccination 08/13/2019, 09/03/2019, 06/21/2020, 07/12/2020, 07/26/2020   Td 08/13/2002   Tdap 12/03/2012    TDAP status: Up to date  Flu Vaccine status: Up to date  {Pneumococcal vaccine status:2101807}  Covid-19 vaccine status: Completed vaccines  Qualifies for Shingles Vaccine? Yes   Zostavax completed No   Shingrix  Completed?: No.    Education has been provided regarding the importance of this vaccine. Patient has been advised to call insurance company to determine out of pocket expense if they have not yet received this vaccine. Advised may also receive vaccine at local pharmacy or Health Dept. Verbalized acceptance and understanding.  Screening Tests Health Maintenance  Topic Date Due   Zoster Vaccines- Shingrix (1 of 2) Never done   PNA vac Low Risk Adult (1 of 2 - PCV13) Never done   INFLUENZA VACCINE  02/20/2021   TETANUS/TDAP  12/04/2022   COVID-19 Vaccine  Completed   HPV VACCINES  Aged Out    Health Maintenance  Health Maintenance Due  Topic Date Due   Zoster Vaccines- Shingrix (1 of 2) Never done   PNA vac Low Risk Adult (1 of 2 - PCV13) Never done    Colorectal cancer screening: No longer required.   Lung Cancer Screening: (Low Dose CT Chest recommended if Age 48-80 years, 30 pack-year currently smoking OR have quit w/in 15years.) does not qualify.    Additional Screening:  Hepatitis C Screening: does not qualify  Vision Screening: Recommended annual ophthalmology exams for early detection of glaucoma and other disorders of the eye. Is the patient up to date with their annual eye exam?  {YES/NO:21197} Who is the provider or what is the name of the office in which the patient attends annual eye exams? *** If pt is not established with a provider, would they like to be referred to a provider to establish care? {YES/NO:21197}.   Dental Screening: Recommended annual dental exams for proper oral hygiene  Community Resource Referral / Chronic Care Management: CRR required this visit?  {YES/NO:21197}  CCM required this visit?  {YES/NO:21197}  Plan:     I have personally reviewed and noted the following in the patient's chart:   Medical and social history Use of alcohol, tobacco or illicit drugs  Current medications and supplements including opioid prescriptions. {Opioid  [phone]} Functional ability and status Nutritional status Physical activity Advanced directives List of other physicians Hospitalizations, surgeries, and ER visits in previous 12 months Vitals Screenings to include cognitive, depression, and falls Referrals and appointments  In addition, I have reviewed and discussed with patient certain preventive protocols, quality metrics, and best practice recommendations. A written personalized care plan for preventive services as well as general preventive health recommendations were provided to patient.     Marta Antu, LPN   [id number]/43/8887  Nurse Health Advisor  Nurse Notes: ***

## 2021-02-09 ENCOUNTER — Ambulatory Visit: Payer: PPO

## 2021-02-09 ENCOUNTER — Ambulatory Visit: Payer: PPO | Admitting: *Deleted

## 2021-02-16 ENCOUNTER — Other Ambulatory Visit: Payer: Self-pay

## 2021-02-16 ENCOUNTER — Ambulatory Visit (INDEPENDENT_AMBULATORY_CARE_PROVIDER_SITE_OTHER): Payer: PPO

## 2021-02-16 DIAGNOSIS — E538 Deficiency of other specified B group vitamins: Secondary | ICD-10-CM | POA: Diagnosis not present

## 2021-02-16 MED ORDER — CYANOCOBALAMIN 1000 MCG/ML IJ SOLN
1000.0000 ug | Freq: Once | INTRAMUSCULAR | Status: AC
  Administered 2021-02-16: 1000 ug via INTRAMUSCULAR

## 2021-02-16 NOTE — Progress Notes (Signed)
Justin [name] is a 87 y.o. male presents to the office today for B12: injections, per physician's orders. Original order: 05-24-2020 Cyanocobalamin (med), 1000 mg/ml (dose),  IM (route) was administered left deltoid (location) today. Patient tolerated injection. Patient due for follow up labs/provider appt:  Patient next injection due: in one month, appt made   [name]

## 2021-02-23 ENCOUNTER — Telehealth: Payer: Self-pay

## 2021-02-27 ENCOUNTER — Ambulatory Visit: Payer: PPO

## 2021-03-06 DIAGNOSIS — Z8042 Family history of malignant neoplasm of prostate: Secondary | ICD-10-CM | POA: Diagnosis not present

## 2021-03-06 DIAGNOSIS — N401 Enlarged prostate with lower urinary tract symptoms: Secondary | ICD-10-CM | POA: Diagnosis not present

## 2021-03-13 DIAGNOSIS — C44329 Squamous cell carcinoma of skin of other parts of face: Secondary | ICD-10-CM | POA: Diagnosis not present

## 2021-03-13 DIAGNOSIS — D485 Neoplasm of uncertain behavior of skin: Secondary | ICD-10-CM | POA: Diagnosis not present

## 2021-03-21 ENCOUNTER — Ambulatory Visit (INDEPENDENT_AMBULATORY_CARE_PROVIDER_SITE_OTHER): Payer: PPO

## 2021-03-21 ENCOUNTER — Other Ambulatory Visit: Payer: Self-pay

## 2021-03-21 VITALS — BP 138/70 | HR 64 | Temp 98.6°F | Resp 16 | Ht 71.0 in | Wt 187.8 lb

## 2021-03-21 DIAGNOSIS — Z0000 Encounter for general adult medical examination without abnormal findings: Secondary | ICD-10-CM | POA: Diagnosis not present

## 2021-03-21 NOTE — Patient Instructions (Signed)
Justin [name] , Thank you for taking time to come for your Medicare Wellness Visit. I appreciate your ongoing commitment to your health goals. Please review the following plan we discussed and let me know if I can assist you in the future.   Screening recommendations/referrals: Colonoscopy: No longer required Recommended yearly ophthalmology/optometry visit for glaucoma screening and checkup Recommended yearly dental visit for hygiene and checkup  Vaccinations: Influenza vaccine: Due 03/2021 Pneumococcal vaccine: Due-May obtain vaccine at our office or your local pharmacy. Tdap vaccine: Up to date-Due-12/04/2022 Shingles vaccine: Discuss with pharmacy   Covid-19: Up to date  Advanced directives: Information given today  Conditions/risks identified: See problem list  Next appointment: Follow up in one year for your annual wellness visit.   Preventive Care 23 Years and Older, Male Preventive care refers to lifestyle choices and visits with your health care provider that can promote health and wellness. What does preventive care include? A yearly physical exam. This is also called an annual well check. Dental exams once or twice a year. Routine eye exams. Ask your health care provider how often you should have your eyes checked. Personal lifestyle choices, including: Daily care of your teeth and gums. Regular physical activity. Eating a healthy diet. Avoiding tobacco and drug use. Limiting alcohol use. Practicing safe sex. Taking low doses of aspirin every day. Taking vitamin and mineral supplements as recommended by your health care provider. What happens during an annual well check? The services and screenings done by your health care provider during your annual well check will depend on your age, overall health, lifestyle risk factors, and family history of disease. Counseling  Your health care provider may ask you questions about your: Alcohol use. Tobacco use. Drug  use. Emotional well-being. Home and relationship well-being. Sexual activity. Eating habits. History of falls. Memory and ability to understand (cognition). Work and work Statistician. Screening  You may have the following tests or measurements: Height, weight, and BMI. Blood pressure. Lipid and cholesterol levels. These may be checked every 5 years, or more frequently if you are over 19 years old. Skin check. Lung cancer screening. You may have this screening every year starting at age 55 if you have a 30-pack-year history of smoking and currently smoke or have quit within the past 15 years. Fecal occult blood test (FOBT) of the stool. You may have this test every year starting at age 31. Flexible sigmoidoscopy or colonoscopy. You may have a sigmoidoscopy every 5 years or a colonoscopy every 10 years starting at age 78. Prostate cancer screening. Recommendations will vary depending on your family history and other risks. Hepatitis C blood test. Hepatitis B blood test. Sexually transmitted disease (STD) testing. Diabetes screening. This is done by checking your blood sugar (glucose) after you have not eaten for a while (fasting). You may have this done every 1-3 years. Abdominal aortic aneurysm (AAA) screening. You may need this if you are a current or former smoker. Osteoporosis. You may be screened starting at age 108 if you are at high risk. Talk with your health care provider about your test results, treatment options, and if necessary, the need for more tests. Vaccines  Your health care provider may recommend certain vaccines, such as: Influenza vaccine. This is recommended every year. Tetanus, diphtheria, and acellular pertussis (Tdap, Td) vaccine. You may need a Td booster every 10 years. Zoster vaccine. You may need this after age 31. Pneumococcal 13-valent conjugate (PCV13) vaccine. One dose is recommended after age 51. Pneumococcal  polysaccharide (PPSV23) vaccine. One dose is  recommended after age 50. Talk to your health care provider about which screenings and vaccines you need and how often you need them. This information is not intended to replace advice given to you by your health care provider. Make sure you discuss any questions you have with your health care provider. Document Released: 08/05/2015 Document Revised: 03/28/2016 Document Reviewed: 05/10/2015 Elsevier Interactive Patient Education  2017 [place] Prevention in the Home Falls can cause injuries. They can happen to people of all ages. There are many things you can do to make your home safe and to help prevent falls. What can I do on the outside of my home? Regularly fix the edges of walkways and driveways and fix any cracks. Remove anything that might make you trip as you walk through a door, such as a raised step or threshold. Trim any bushes or trees on the path to your home. Use bright outdoor lighting. Clear any walking paths of anything that might make someone trip, such as rocks or tools. Regularly check to see if handrails are loose or broken. Make sure that both sides of any steps have handrails. Any raised decks and porches should have guardrails on the edges. Have any leaves, snow, or ice cleared regularly. Use sand or salt on walking paths during winter. Clean up any spills in your garage right away. This includes oil or grease spills. What can I do in the bathroom? Use night lights. Install grab bars by the toilet and in the tub and shower. Do not use towel bars as grab bars. Use non-skid mats or decals in the tub or shower. If you need to sit down in the shower, use a plastic, non-slip stool. Keep the floor dry. Clean up any water that spills on the floor as soon as it happens. Remove soap buildup in the tub or shower regularly. Attach bath mats securely with double-sided non-slip rug tape. Do not have throw rugs and other things on the floor that can make you  trip. What can I do in the bedroom? Use night lights. Make sure that you have a light by your bed that is easy to reach. Do not use any sheets or blankets that are too big for your bed. They should not hang down onto the floor. Have a firm chair that has side arms. You can use this for support while you get dressed. Do not have throw rugs and other things on the floor that can make you trip. What can I do in the kitchen? Clean up any spills right away. Avoid walking on wet floors. Keep items that you use a lot in easy-to-reach places. If you need to reach something above you, use a strong step stool that has a grab bar. Keep electrical cords out of the way. Do not use floor polish or wax that makes floors slippery. If you must use wax, use non-skid floor wax. Do not have throw rugs and other things on the floor that can make you trip. What can I do with my stairs? Do not leave any items on the stairs. Make sure that there are handrails on both sides of the stairs and use them. Fix handrails that are broken or loose. Make sure that handrails are as long as the stairways. Check any carpeting to make sure that it is firmly attached to the stairs. Fix any carpet that is loose or worn. Avoid having throw rugs at the top  or bottom of the stairs. If you do have throw rugs, attach them to the floor with carpet tape. Make sure that you have a light switch at the top of the stairs and the bottom of the stairs. If you do not have them, ask someone to add them for you. What else can I do to help prevent falls? Wear shoes that: Do not have high heels. Have rubber bottoms. Are comfortable and fit you well. Are closed at the toe. Do not wear sandals. If you use a stepladder: Make sure that it is fully opened. Do not climb a closed stepladder. Make sure that both sides of the stepladder are locked into place. Ask someone to hold it for you, if possible. Clearly mark and make sure that you can  see: Any grab bars or handrails. First and last steps. Where the edge of each step is. Use tools that help you move around (mobility aids) if they are needed. These include: Canes. Walkers. Scooters. Crutches. Turn on the lights when you go into a dark area. Replace any light bulbs as soon as they burn out. Set up your furniture so you have a clear path. Avoid moving your furniture around. If any of your floors are uneven, fix them. If there are any pets around you, be aware of where they are. Review your medicines with your doctor. Some medicines can make you feel dizzy. This can increase your chance of falling. Ask your doctor what other things that you can do to help prevent falls. This information is not intended to replace advice given to you by your health care provider. Make sure you discuss any questions you have with your health care provider. Document Released: 05/05/2009 Document Revised: 12/15/2015 Document Reviewed: 08/13/2014 Elsevier Interactive Patient Education  2017 [employer].

## 2021-03-21 NOTE — Progress Notes (Signed)
Subjective:   Justin [name] is a 87 y.o. male who presents for Medicare Annual/Subsequent preventive examination.  Review of Systems     Cardiac Risk Factors include: male gender;advanced age (>67mn, >>34women);dyslipidemia     Objective:    Today's Vitals   03/21/21 1010  BP: 138/70  Pulse: 64  Resp: 16  Temp: 98.6 F (37 C)  TempSrc: Temporal  SpO2: 97%  Weight: 187 lb 12.8 oz (85.2 kg)  Height: '5\' 11"'$  (1.803 m)   Body mass index is 26.19 kg/m.  Advanced Directives 03/21/2021 11/23/2020 08/16/2020 02/09/2020 07/28/2018 06/15/2016 06/08/2016  Does Patient Have a Medical Advance Directive? No No No No No No No  Would patient like information on creating a medical advance directive? Yes (MAU/Ambulatory/Procedural Areas - Information given) No - Patient declined No - Patient declined No - Patient declined No - Patient declined - Yes - Educational materials given    Current Medications (verified) Outpatient Encounter Medications as of 03/21/2021  Medication Sig   aspirin 81 MG tablet Take 81 mg by mouth daily.   finasteride (PROSCAR) 5 MG tablet Take 1 tablet (5 mg total) by mouth daily.   simvastatin (ZOCOR) 20 MG tablet 1 po qd (Patient taking differently: Take 20 mg by mouth daily. 1 po qd)   Facility-Administered Encounter Medications as of 03/21/2021  Medication   cyanocobalamin ((VITAMIN B-12)) injection 1,000 mcg    Allergies (verified) Prednisone, Inderal [propranolol], and Omeprazole   History: Past Medical History:  Diagnosis Date   Anemia    Basal cell cancer    Dr Justin [name]  Benign prostatic hypertrophy    Dr Justin [name]  Carpal tunnel syndrome 10/19/2015   Right   Chronic kidney disease    Colon polyp    Diverticulosis    Hyperlipidemia    Kidney stones    Shingles    Stroke ([facility]    Transient ischemic attack    Dr Justin [name]  Past Surgical History:  Procedure Laterality Date   CATARACT EXTRACTION, BILATERAL     COLONOSCOPY W/ POLYPECTOMY  01/2009    adenomatous, Dr. RAlben [name]  HEMORRHOID SURGERY     LITHOTRIPSY      X 2;Dr. EAmalia [name]  PROSTATE BIOPSY     X2   Family History  Problem Relation Age of Onset   Heart failure Father    Heart attack Father 754  Diabetes Mother    Colon cancer Mother    Prostate cancer Brother    Stroke Brother    Coronary artery disease Brother        7 vessel CBAG   Diabetes Brother    Heart attack Other        2 P uncles , both > 533  Stroke Brother        in late 70s   Colon polyps Sister    Heart attack Brother    Other Sister        brain hemorrhage due to a fall   Social History   Socioeconomic History   Marital status: Married    Spouse name: Not on file   Number of children: 4   Years of education: Not on file   Highest education level: Not on file  Occupational History   Occupation: Truck dEducation administrator [employer]  Tobacco Use   Smoking status: Former    Types: Cigarettes, Pipe, Cigars    Quit date: 07/23/1976  Years since quitting: 44.6   Smokeless tobacco: Former    Types: Chew   Tobacco comments:    smoked [phone], up to < 1 ppd  Substance and Sexual Activity   Alcohol use: No    Alcohol/week: 1.0 standard drink    Types: 1 Glasses of wine per week    Comment: rare wine   Drug use: No   Sexual activity: Not on file  Other Topics Concern   Not on file  Social History Narrative   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, 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60   Social Determinants of Radio broadcast assistant Strain: Low Risk    Difficulty of Paying Living Expenses: Not hard at all  Food Insecurity: No Food Insecurity   Worried About Charity fundraiser in the Last Year: Never true   Arboriculturist in the Last Year: Never true  Transportation Needs: No Transportation Needs   Lack of Transportation (Medical): No   Lack of Transportation (Non-Medical): No  Physical Activity: Inactive   Days of Exercise per  Week: 0 days   Minutes of Exercise per Session: 0 min  Stress: No Stress Concern Present   Feeling of Stress : Not at all  Social Connections: Moderately Isolated   Frequency of Communication with Friends and Family: More than three times a week   Frequency of Social Gatherings with Friends and Family: More than three times a week   Attends Religious Services: Never   Marine scientist or Organizations: No   Attends Archivist Meetings: Never   Marital Status: Married    Tobacco Counseling Counseling given: Not Answered Tobacco comments: smoked [phone], up to < 1 ppd   Clinical Intake:  Pre-visit preparation completed: Yes  Pain : No/denies pain     Nutritional Status: BMI 25 -29 Overweight Nutritional Risks: None Diabetes: No  How often do you need to have someone help you when you read instructions, pamphlets, or other written materials from your doctor or pharmacy?: 1 - Never  Diabetic?No  Interpreter Needed?: No  Information entered by :: Caroleen Hamman LPN   Activities of Daily Living In your present state of health, do you have any difficulty performing the following activities: 03/21/2021  Hearing? Y  Comment mild hearing loss in right ear  Vision? N  Difficulty concentrating or making decisions? N  Walking or climbing stairs? N  Dressing or bathing? N  Doing errands, shopping? N  Preparing Food and eating ? N  Using the Toilet? N  In the past six months, have you accidently leaked urine? N  Do you have problems with loss of bowel control? N  Managing your Medications? N  Managing your Finances? N  Housekeeping or managing your Housekeeping? N  Some recent data might be hidden    Patient Care Team: Carollee Herter, Alferd Apa, DO as PCP - General (Family Medicine) Domingo Pulse, MD (Urology) Rutherford Guys, MD as Consulting Physician (Ophthalmology) Macario Carls, MD as Referring Physician (Dermatology)  Indicate any recent Medical  Services you may have received from other than [facility] providers in the past year (date may be approximate).     Assessment:   This is a routine wellness examination for Justin [name].  Hearing/Vision screen Hearing Screening - Comments:: C/o mild hearing loss Vision Screening - Comments:: Wears glasses Last eye exam-12/2019  Dietary issues and exercise activities discussed: Current  Exercise Habits: The patient does not participate in regular exercise at present, Exercise limited by: None identified   Goals Addressed             This Visit's Progress    DIET - INCREASE WATER INTAKE   On track      Depression Screen PHQ 2/9 Scores 03/21/2021 02/09/2020 07/28/2018 10/26/2016 12/09/2015 02/28/2015 02/25/2015  PHQ - 2 Score 0 0 0 0 0 0 0  Exception Documentation - - - - Patient refusal - -    Fall Risk Fall Risk  03/21/2021 02/09/2020 10/01/2019 07/28/2018 10/26/2016  Falls in the past year? 1 0 1 0 Yes  Number falls in past yr: 0 0 0 - 1  Injury with Fall? 1 0 0 - Yes  Comment - - - - pt report he tripped over a curb at [place], pt report he injuried by knees  Risk Factor Category  - - - - -  Risk for fall due to : History of fall(s) - - - -  Follow up Falls prevention discussed Education provided;Falls prevention discussed Falls evaluation completed - -    FALL RISK PREVENTION PERTAINING TO THE HOME:  Any stairs in or around the home? Yes  If so, are there any without handrails? No  Home free of loose throw rugs in walkways, pet beds, electrical cords, etc? Yes  Adequate lighting in your home to reduce risk of falls? Yes   ASSISTIVE DEVICES UTILIZED TO PREVENT FALLS:  Life alert? No  Use of a cane, walker or w/c? No  Grab bars in the bathroom? Yes  Shower chair or bench in shower? No  Elevated toilet seat or a handicapped toilet? No   TIMED UP AND GO:  Was the test performed? Yes .  Length of time to ambulate 10 feet: 11 sec.   Gait steady and fast without use of assistive  device  Cognitive Function:Normal cognitive status assessed by direct observation by this Nurse Health Advisor. No abnormalities found.   MMSE - Mini Mental State Exam 07/28/2018  Orientation to time 5  Orientation to Place 5  Registration 3  Attention/ Calculation 5  Recall 2  Language- name 2 objects 2  Language- repeat 1  Language- follow 3 step command 3  Language- read & follow direction 1  Write a sentence 1  Copy design 1  Total score 29     6CIT Screen 02/09/2020  What Year? 0 points  What month? 0 points  What time? 0 points  Count back from 20 0 points  Months in reverse 0 points  Repeat phrase 2 points  Total Score 2    Immunizations Immunization History  Administered Date(s) Administered   Fluad Quad(high Dose 65+) 05/22/2019, 05/19/2020   Influenza Split 05/25/2011, 05/23/2012   Influenza Whole 05/27/2007, 05/26/2008, 06/13/2009, 05/23/2010   Influenza, High Dose Seasonal PF 05/25/2015, 06/08/2016, 04/26/2017, 05/28/2018   Influenza, Seasonal, Injecte, Preservative Fre 05/25/2013   Influenza,inj,Quad PF,6+ Mos 05/10/2014   PFIZER(Purple Top)SARS-COV-2 Vaccination 08/13/2019, 09/03/2019, 06/21/2020, 07/12/2020, 07/26/2020   Td 08/13/2002   Tdap 12/03/2012    TDAP status: Up to date  Flu Vaccine status: Due, Education has been provided regarding the importance of this vaccine. Advised may receive this vaccine at local pharmacy or Health Dept. Aware to provide a copy of the vaccination record if obtained from local pharmacy or Health Dept. Verbalized acceptance and understanding.  Pneumococcal vaccine status: Due, Education has been provided regarding the importance of this vaccine. Advised  may receive this vaccine at local pharmacy or Health Dept. Aware to provide a copy of the vaccination record if obtained from local pharmacy or Health Dept. Verbalized acceptance and understanding.  Covid-19 vaccine status: Completed vaccines  Qualifies for Shingles  Vaccine? Yes   Zostavax completed No   Shingrix Completed?: No.    Education has been provided regarding the importance of this vaccine. Patient has been advised to call insurance company to determine out of pocket expense if they have not yet received this vaccine. Advised may also receive vaccine at local pharmacy or Health Dept. Verbalized acceptance and understanding.  Screening Tests Health Maintenance  Topic Date Due   Zoster Vaccines- Shingrix (1 of 2) Never done   PNA vac Low Risk Adult (1 of 2 - PCV13) Never done   INFLUENZA VACCINE  02/20/2021   TETANUS/TDAP  12/04/2022   COVID-19 Vaccine  Completed   HPV VACCINES  Aged Out    Health Maintenance  Health Maintenance Due  Topic Date Due   Zoster Vaccines- Shingrix (1 of 2) Never done   PNA vac Low Risk Adult (1 of 2 - PCV13) Never done   INFLUENZA VACCINE  02/20/2021    Colorectal cancer screening: No longer required.   Lung Cancer Screening: (Low Dose CT Chest recommended if Age 63-80 years, 30 pack-year currently smoking OR have quit w/in 15years.) does not qualify.     Additional Screening:  Hepatitis C Screening: does not qualify  Vision Screening: Recommended annual ophthalmology exams for early detection of glaucoma and other disorders of the eye. Is the patient up to date with their annual eye exam?  Yes  Who is the provider or what is the name of the office in which the patient attends annual eye exams? Pt unsure of name   Dental Screening: Recommended annual dental exams for proper oral hygiene  Community Resource Referral / Chronic Care Management: CRR required this visit?  No   CCM required this visit?  No      Plan:     I have personally reviewed and noted the following in the patient's chart:   Medical and social history Use of alcohol, tobacco or illicit drugs  Current medications and supplements including opioid prescriptions. Patient is not currently taking opioid  prescriptions. Functional ability and status Nutritional status Physical activity Advanced directives List of other physicians Hospitalizations, surgeries, and ER visits in previous 12 months Vitals Screenings to include cognitive, depression, and falls Referrals and appointments  In addition, I have reviewed and discussed with patient certain preventive protocols, quality metrics, and best practice recommendations. A written personalized care plan for preventive services as well as general preventive health recommendations were provided to patient.      Marta Antu, LPN   [id number]  Nurse Health Advisor  Nurse Notes: None

## 2021-03-24 ENCOUNTER — Ambulatory Visit: Payer: PPO

## 2021-04-07 ENCOUNTER — Other Ambulatory Visit: Payer: Self-pay

## 2021-04-07 ENCOUNTER — Encounter: Payer: Self-pay | Admitting: Family Medicine

## 2021-04-07 ENCOUNTER — Ambulatory Visit (INDEPENDENT_AMBULATORY_CARE_PROVIDER_SITE_OTHER): Payer: PPO | Admitting: Family Medicine

## 2021-04-07 VITALS — BP 136/75 | HR 76 | Temp 97.6°F | Resp 16 | Wt 184.0 lb

## 2021-04-07 DIAGNOSIS — S76111A Strain of right quadriceps muscle, fascia and tendon, initial encounter: Secondary | ICD-10-CM | POA: Diagnosis not present

## 2021-04-07 MED ORDER — TRAMADOL HCL 50 MG PO TABS: 50.0000 mg | ORAL_TABLET | Freq: Three times a day (TID) | ORAL | 0 refills | Status: DC | PRN

## 2021-04-07 NOTE — Patient Instructions (Addendum)
Heat (pad or rice pillow in microwave) over affected area, 10-15 minutes twice daily.   OK to take Tylenol 1000 mg (2 extra strength tabs) or 975 mg (3 regular strength tabs) every 6 hours as needed.  Do not drink alcohol, do any illicit/street drugs, drive or do anything that requires alertness while on this medicine.   This is not a clot, infection, broken bone or a pinched nerve.   Let us know if you need anything.  Quadriceps Strain Rehab It is normal to feel mild stretching, pulling, tightness, or discomfort as you do these exercises, but you should stop right away if you feel sudden pain or your pain gets worse. Stretching and range of motion exercises These exercises warm up your muscles and joints and improve the movement and flexibility of your thigh. These exercises can also help to relieve stiffness or swelling. Exercise A: Heel slides    Lie on your back with both knees straight. If this causes back discomfort, bend the knee of your healthy leg, placing your foot flat on the floor. Slowly slide your left / right heel back toward your buttocks until you feel a gentle stretch in the front of your knee or thigh. Hold for 30 seconds. Then slowly slide your heel back to the starting position. Repeat 2 times. Complete this exercise 3 times a week. Exercise B: Quadriceps stretch, prone    Lie on your abdomen on a firm surface, such as a bed or padded floor. Bend your left / right knee and hold your ankle. If you cannot reach your ankle or pant leg, loop a belt around your foot and grab the belt instead. Gently pull your heel toward your buttocks. Your knee should not slide out to the side. You should feel a stretch in the front of your thigh and knee. Hold this position for 30 seconds. Repeat 2 times. Complete this exercise 3 times a week. Strengthening exercises These exercises build strength and endurance in your thigh. Endurance is the ability to use your muscles for a long  time, even after your muscles get tired. Exercise C: Straight leg raises (quadriceps and hip flexors) Quality counts! Watch for signs that the quadriceps muscle is working to ensure that you are strengthening the correct muscles and not cheating by using healthier muscles. Lie on your back with your left / right leg extended and your other knee bent. Tense the muscles in the front of your left / right thigh. You should see your kneecap slide up or see increased dimpling just above the knee. Tighten these muscles even more and raise your leg 4-6 inches (10-15 cm) off the floor. Hold for 3 seconds. Keep the thigh muscles tense as you lower your leg. Relax the muscles slowly and completely after each repetition. Repeat 2 times. Complete this exercise 3 times a week. Exercise D: Straight leg raises (hip extensors) Lie on your belly on a bed or a firm surface with a pillow under your hips. Bend your left / right knee so your foot is straight up in the air. Tense your buttock muscles and lift your left / right thigh off the bed. Do not let your back arch. Hold this position for 3 seconds. Slowly return to the starting position. Let your muscles relax completely before doing another repetition. Repeat 2 times. Complete this exercise 3 times a week. Exercise E: Wall sits    Follow the directions for form closely. If you do not place your feet and  knees properly, this can lead to knee pain. Lean back against a smooth wall or door and walk your feet out 18-24 inches (46-61 cm) from it. Place your feet hip-width apart. Slowly slide down the wall or door until your knees bend  60-90 degrees. Keep your weight back and over your heels, not over your toes. Keep your thighs straight or pointing slightly outward. Hold for 1 second. Use your thigh and buttock muscles to push you back up to a standing position. Keep your weight through your heels while you do this. Rest for 5 seconds in between  repetitions. Repeat 2 times. Complete this exercise 3 times a week. Make sure you discuss any questions you have with your health care provider. Document Released: 07/09/2005 Document Revised: 03/15/2016 Document Reviewed: 04/12/2015 Elsevier Interactive Patient Education  [employer].

## 2021-04-07 NOTE — Progress Notes (Signed)
Musculoskeletal Exam  Patient: Justin [name] DOB: [birth year]  DOS: 04/07/2021  SUBJECTIVE:  Chief Complaint:   Chief Complaint  Patient presents with   Knee Pain    Complains of right knee pain for 3 days    Justin [name] is a 87 y.o.  male for evaluation and treatment of R knee pain.   Onset:  3 days ago. No inj or change in activity. Has been active in yard, but nothing out of the ordinary Location: lower R thigh just above knee Character:  aching  Progression of issue:  is unchanged Associated symptoms: worse towards end of day Denies loss of ROM, bruising, redness, swelling. Treatment: to date has been acetaminophen, ice, heat.  Heat helped the most.  Neurovascular symptoms: no  Past Medical History:  Diagnosis Date   Anemia    Basal cell cancer    Dr Wilhemina Bonito   Benign prostatic hypertrophy    Dr Amalia Hailey   Carpal tunnel syndrome 10/19/2015   Right   Chronic kidney disease    Colon polyp    Diverticulosis    Hyperlipidemia    Kidney stones    Shingles    Stroke ([place])    Transient ischemic attack    Dr Leonie Man    Objective: VITAL SIGNS: BP 136/75 (BP Location: Right Arm, Patient Position: Sitting, Cuff Size: Small)   Pulse 76   Temp 97.6 F (36.4 C) (Oral)   Resp 16   Wt 184 lb (83.5 kg)   SpO2 100%   BMI 25.66 kg/m  Constitutional: Well formed, well developed. No acute distress. Thorax & Lungs: No accessory muscle use Musculoskeletal: R knee.   Normal active range of motion: yes.   Normal passive range of motion: yes Tenderness to palpation: mild ttp over distal quad Deformity: no Ecchymosis: no Tests positive: none Tests negative: McMurray's, Lachman's, varus/valgus stress, patellar grind, Stine's Neurologic: Normal sensory function. Gait is slow/cautious Psychiatric: Normal mood. Age appropriate judgment and insight. Alert & oriented x 3.    Assessment:  Strain of right quadriceps, initial encounter - Plan: traMADol (ULTRAM) 50 MG  tablet  Plan: Stretches/exercises, heat, ice, Tylenol. Tramadol prn. Warnings about this verbalized and written down. PT if no better.  F/u prn. The patient voiced understanding and agreement to the plan.   [employer], DO 04/07/21  8:38 AM

## 2021-04-11 ENCOUNTER — Other Ambulatory Visit: Payer: Self-pay

## 2021-04-11 ENCOUNTER — Telehealth: Payer: Self-pay | Admitting: Family Medicine

## 2021-04-11 ENCOUNTER — Ambulatory Visit (INDEPENDENT_AMBULATORY_CARE_PROVIDER_SITE_OTHER): Payer: PPO | Admitting: Family Medicine

## 2021-04-11 ENCOUNTER — Encounter: Payer: Self-pay | Admitting: Family Medicine

## 2021-04-11 VITALS — BP 140/76 | HR 66 | Temp 97.9°F | Ht 71.0 in | Wt 185.2 lb

## 2021-04-11 DIAGNOSIS — M79651 Pain in right thigh: Secondary | ICD-10-CM | POA: Diagnosis not present

## 2021-04-11 DIAGNOSIS — S76111A Strain of right quadriceps muscle, fascia and tendon, initial encounter: Secondary | ICD-10-CM | POA: Diagnosis not present

## 2021-04-11 MED ORDER — TRAMADOL HCL 50 MG PO TABS: 50.0000 mg | ORAL_TABLET | Freq: Three times a day (TID) | ORAL | 0 refills | Status: AC | PRN

## 2021-04-11 MED ORDER — LIDOCAINE 5 % EX PTCH: 1.0000 | MEDICATED_PATCH | CUTANEOUS | 0 refills | Status: DC

## 2021-04-11 NOTE — Patient Instructions (Addendum)
If you do not hear anything about your referral in the next 1-2 weeks, call our office and ask for an update.  Try lidocaine patches.   OK to take Tylenol 1000 mg (2 extra strength tabs) or 975 mg (3 regular strength tabs) every 6 hours as needed.  Start the tramadol.   Ice/cold pack over area for 10-15 min twice daily.  Heat (pad or rice pillow in microwave) over affected area, 10-15 minutes twice daily.   Let us know if you need anything.

## 2021-04-11 NOTE — Telephone Encounter (Signed)
Initiated PA through Covermymeds KEY: [id number]

## 2021-04-11 NOTE — Progress Notes (Signed)
Musculoskeletal Exam  Patient: Justin [name] DOB: 1925-05-23  DOS: 04/11/2021  SUBJECTIVE:  Chief Complaint:   Chief Complaint  Patient presents with   Knee Pain    Right     Justin [name] is a 87 y.o.  male for evaluation and treatment of R thigh pain. Here w spouse.   Onset:  1 week ago. No inj or change in activity.  Location: distal R thigh pain Character:  aching and burning  Progression of issue:  has worsened Associated symptoms: slight swelling Treatment: to date has been ice, OTC NSAIDS, acetaminophen, home exercises, and massage.   Neurovascular symptoms: no  Past Medical History:  Diagnosis Date   Anemia    Basal cell cancer    Dr Wilhemina Bonito   Benign prostatic hypertrophy    Dr Amalia Hailey   Carpal tunnel syndrome 10/19/2015   Right   Chronic kidney disease    Colon polyp    Diverticulosis    Hyperlipidemia    Kidney stones    Shingles    Stroke ([place])    Transient ischemic attack    Dr Leonie Man    Objective: VITAL SIGNS: BP 140/76   Pulse 66   Temp 97.9 F (36.6 C) (Oral)   Ht 5\' 11"  (1.803 m)   Wt 185 lb 4 oz (84 kg)   SpO2 97%   BMI 25.84 kg/m  Constitutional: Well formed, well developed. No acute distress. Thorax & Lungs: No accessory muscle use Musculoskeletal: Thigh.   Tenderness to palpation: yes over distal thigh Deformity: no Ecchymosis: no Tests positive: none Tests negative: Lachman's, varus/valgus stress, Stine's Neurologic: Normal sensory function. Psychiatric: Normal mood. Age appropriate judgment and insight. Alert & oriented x 3.    Assessment:  Pain of right thigh - Plan: Ambulatory referral to Sports Medicine  Strain of right quadriceps, initial encounter - Plan: traMADol (ULTRAM) 50 MG tablet  Plan: Stretches/exercises, heat, ice, Tylenol.  Will start tramadol. Refer sports med.  F/u prn. The patient and his spouse voiced understanding and agreement to the plan.   [place], DO 04/11/21  2:15 PM

## 2021-04-14 NOTE — Telephone Encounter (Signed)
Received Denial of medication. Reason of denial is that the drug requested for a non medically accepted indication. Patient informed of denial The patients wife stated they just picked up the [facility] for this

## 2021-04-24 DIAGNOSIS — D485 Neoplasm of uncertain behavior of skin: Secondary | ICD-10-CM | POA: Diagnosis not present

## 2021-04-24 DIAGNOSIS — C44329 Squamous cell carcinoma of skin of other parts of face: Secondary | ICD-10-CM | POA: Diagnosis not present

## 2021-04-25 DIAGNOSIS — D485 Neoplasm of uncertain behavior of skin: Secondary | ICD-10-CM | POA: Diagnosis not present

## 2021-04-25 DIAGNOSIS — L821 Other seborrheic keratosis: Secondary | ICD-10-CM | POA: Diagnosis not present

## 2021-04-25 DIAGNOSIS — C44212 Basal cell carcinoma of skin of right ear and external auricular canal: Secondary | ICD-10-CM | POA: Diagnosis not present

## 2021-04-25 DIAGNOSIS — L570 Actinic keratosis: Secondary | ICD-10-CM | POA: Diagnosis not present

## 2021-05-05 ENCOUNTER — Other Ambulatory Visit: Payer: Self-pay | Admitting: Family Medicine

## 2021-05-05 DIAGNOSIS — E785 Hyperlipidemia, unspecified: Secondary | ICD-10-CM

## 2021-05-11 DIAGNOSIS — L810 Postinflammatory hyperpigmentation: Secondary | ICD-10-CM | POA: Diagnosis not present

## 2021-05-11 DIAGNOSIS — L249 Irritant contact dermatitis, unspecified cause: Secondary | ICD-10-CM | POA: Diagnosis not present

## 2021-05-11 DIAGNOSIS — C44212 Basal cell carcinoma of skin of right ear and external auricular canal: Secondary | ICD-10-CM | POA: Diagnosis not present

## 2021-05-19 ENCOUNTER — Other Ambulatory Visit: Payer: Self-pay

## 2021-05-19 ENCOUNTER — Ambulatory Visit (INDEPENDENT_AMBULATORY_CARE_PROVIDER_SITE_OTHER): Payer: PPO

## 2021-05-19 DIAGNOSIS — E538 Deficiency of other specified B group vitamins: Secondary | ICD-10-CM

## 2021-05-19 DIAGNOSIS — Z23 Encounter for immunization: Secondary | ICD-10-CM

## 2021-05-19 NOTE — Progress Notes (Signed)
Pt here for monthly B12 injection per [facility]  B12 10107mcg given left IM, and pt tolerated injection well.  Next B12 injection scheduled for next month

## 2021-05-23 ENCOUNTER — Ambulatory Visit: Payer: PPO

## 2021-05-24 ENCOUNTER — Ambulatory Visit: Payer: PPO

## 2021-05-24 DIAGNOSIS — C4491 Basal cell carcinoma of skin, unspecified: Secondary | ICD-10-CM | POA: Diagnosis not present

## 2021-05-24 DIAGNOSIS — C44212 Basal cell carcinoma of skin of right ear and external auricular canal: Secondary | ICD-10-CM | POA: Diagnosis not present

## 2021-05-24 DIAGNOSIS — Z481 Encounter for planned postprocedural wound closure: Secondary | ICD-10-CM | POA: Diagnosis not present

## 2021-05-26 ENCOUNTER — Encounter: Payer: PPO | Admitting: Family Medicine

## 2021-06-20 ENCOUNTER — Other Ambulatory Visit: Payer: Self-pay

## 2021-06-20 ENCOUNTER — Ambulatory Visit (INDEPENDENT_AMBULATORY_CARE_PROVIDER_SITE_OTHER): Payer: PPO

## 2021-06-20 DIAGNOSIS — E538 Deficiency of other specified B group vitamins: Secondary | ICD-10-CM

## 2021-06-20 NOTE — Progress Notes (Signed)
Justin [name] is a 87 y.o. male presents to the office today for Monthly b12  injection, per physician's orders. Original order: years ago B12 1000 mcg, IM (route) was administered L deltoid (location) today. Patient tolerated injection. Patient due for follow up labs/provider appt: No. Date due: Apt pending 06/27/21 Patient next injection due: 1 month, appt made Yes  [place], Kristine Garbe L

## 2021-06-27 ENCOUNTER — Ambulatory Visit (INDEPENDENT_AMBULATORY_CARE_PROVIDER_SITE_OTHER): Payer: PPO | Admitting: Family Medicine

## 2021-06-27 ENCOUNTER — Encounter: Payer: Self-pay | Admitting: Family Medicine

## 2021-06-27 VITALS — BP 120/68 | HR 69 | Temp 97.7°F | Ht 70.0 in | Wt 190.2 lb

## 2021-06-27 DIAGNOSIS — Z0000 Encounter for general adult medical examination without abnormal findings: Secondary | ICD-10-CM | POA: Diagnosis not present

## 2021-06-27 LAB — COMPREHENSIVE METABOLIC PANEL
ALT: 13 U/L (ref 0–53)
AST: 16 U/L (ref 0–37)
Albumin: 3.7 g/dL (ref 3.5–5.2)
Alkaline Phosphatase: 65 U/L (ref 39–117)
BUN: 19 mg/dL (ref 6–23)
CO2: 30 mEq/L (ref 19–32)
Calcium: 8.6 mg/dL (ref 8.4–10.5)
Chloride: 104 mEq/L (ref 96–112)
Creatinine, Ser: 0.86 mg/dL (ref 0.40–1.50)
GFR: 74.36 mL/min (ref >60.00)
Glucose, Bld: 74 mg/dL (ref 70–99)
Potassium: 3.6 mEq/L (ref 3.5–5.1)
Sodium: 139 mEq/L (ref 135–145)
Total Bilirubin: 0.5 mg/dL (ref 0.2–1.2)
Total Protein: 8 g/dL (ref 6.0–8.3)

## 2021-06-27 LAB — LIPID PANEL
Cholesterol: 122 mg/dL (ref 0–200)
HDL: 45.9 mg/dL (ref >39.00)
LDL Cholesterol: 52 mg/dL (ref 0–99)
NonHDL: 76.19
Total CHOL/HDL Ratio: 3
Triglycerides: 121 mg/dL (ref 0.0–149.0)
VLDL: 24.2 mg/dL (ref 0.0–40.0)

## 2021-06-27 NOTE — Patient Instructions (Addendum)
Give [place] 2-3 business days to get the results of your labs back.   Keep the diet clean and stay active.  OK to use Debrox (peroxide) in the ear to loosen up wax. Also recommend using a bulb syringe (for removing boogers from baby's noses) to flush through warm water and vinegar (3-4:1 ratio). An alternative, though more expensive, is an elephant ear washer wax removal kit. Do not use Q-tips as this can impact wax further.  The new Shingrix vaccine (for shingles) is a 2 shot series. It can make people feel low energy, achy and almost like they have the flu for 48 hours after injection. Please plan accordingly when deciding on when to get this shot. Call our office for a nurse visit appointment to get this. The second shot of the series is less severe regarding the side effects, but it still lasts 48 hours.   I recommend getting the updated bivalent covid vaccination booster at your convenience.   Let us know if you change your mind with the pneumonia vaccine.   Let us know if you need anything.

## 2021-06-27 NOTE — Progress Notes (Signed)
Chief Complaint  Patient presents with   Annual Exam    Side of pain numbness    Well Male Justin [name] is here for a complete physical.   His last physical was >1 year ago.  Current diet: in general, a "healthy" diet.   Current exercise: active in the yard Weight trend: stable Fatigue out of ordinary? No. Seat belt? Yes.    Health maintenance Shingrix- No Tetanus- Yes Pneumonia vaccine- Due for PCV20  Past Medical History:  Diagnosis Date   Anemia    Basal cell cancer    Dr Wilhemina Bonito   Benign prostatic hypertrophy    Dr Amalia Hailey   Carpal tunnel syndrome 10/19/2015   Right   Chronic kidney disease    Colon polyp    Diverticulosis    Hyperlipidemia    Kidney stones    Shingles    Stroke [facility])    Transient ischemic attack    Dr Leonie Man     Past Surgical History:  Procedure Laterality Date   CATARACT EXTRACTION, BILATERAL     COLONOSCOPY W/ POLYPECTOMY  01/2009   adenomatous, Dr. Alben Spittle   HEMORRHOID SURGERY     LITHOTRIPSY      X 2;Dr. Amalia Hailey   PROSTATE BIOPSY     X2    Medications  Current Outpatient Medications on File Prior to Visit  Medication Sig Dispense Refill   aspirin 81 MG tablet Take 81 mg by mouth daily.     finasteride (PROSCAR) 5 MG tablet Take 1 tablet (5 mg total) by mouth daily. 90 tablet 1   lidocaine (LIDODERM) 5 % Place 1 patch onto the skin daily. Remove & Discard patch within 12 hours. 30 patch 0   simvastatin (ZOCOR) 20 MG tablet TAKE 1 TABLET BY MOUTH EVERY DAY 90 tablet 1    Allergies Allergies  Allergen Reactions   Prednisone Hives    Unknown REACTION: RASH   Inderal [Propranolol]     When taken for tremor it caused "streaks of pain " across forehead   Omeprazole Rash    Family History Family History  Problem Relation Age of Onset   Heart failure Father    Heart attack Father 52   Diabetes Mother    Colon cancer Mother    Prostate cancer Brother    Stroke Brother    Coronary artery disease Brother        7 vessel  CBAG   Diabetes Brother    Heart attack Other        2 P uncles , both > 62   Stroke Brother        in late 70s   Colon polyps Sister    Heart attack Brother    Other Sister        brain hemorrhage due to a fall    Review of Systems: Constitutional:  no fevers Eye:  no recent significant change in vision Ears:  No changes in hearing Nose/Mouth/Throat:  no complaints of nasal congestion, no sore throat Cardiovascular: no chest pain Respiratory:  No shortness of breath Gastrointestinal:  No change in bowel habits GU:  No frequency Integumentary:  no abnormal skin lesions reported (sees derm) Neurologic:  no headaches Endocrine:  denies unexplained weight changes  Exam BP 120/68   Pulse 69   Temp 97.7 F (36.5 C) (Oral)   Ht 5\' 10"  (1.778 m)   Wt 190 lb 4 oz (86.3 kg)   SpO2 98%   BMI 27.30 kg/m  General:  well developed, well nourished, in no apparent distress Skin:  no significant moles, warts, or growths Head:  no masses, lesions, or tenderness Eyes:  pupils equal and round, sclera anicteric without injection Ears:  canals without lesions, TMs shiny without retraction, no obvious effusion, no erythema Nose:  nares patent, septum midline, mucosa normal Throat/Pharynx:  lips and gingiva without lesion; tongue and uvula midline; non-inflamed pharynx; no exudates or postnasal drainage Lungs:  clear to auscultation, breath sounds equal bilaterally, no respiratory distress Cardio:  regular rate and rhythm, no LE edema or bruits Rectal: Deferred GI: BS+, S, NT, ND, no masses or organomegaly Musculoskeletal:  symmetrical muscle groups noted without atrophy or deformity Neuro:  gait normal; deep tendon reflexes normal and symmetric Psych: well oriented with normal range of affect and appropriate judgment/insight  Assessment and Plan  Well adult exam - Plan: Comprehensive metabolic panel, Lipid panel   Well 87 y.o. male. Counseled on diet and exercise. Other orders as  above. Shingrix declined as well as PCV20. He will let us know if he changes his mind.  Pt follows a urology.  Has no plans to get bivalent booster for covid, rec'd he get.  Follow up in 6 mo w reg pcp.  The patient voiced understanding and agreement to the plan.  [place], DO 06/27/21 9:24 AM

## 2021-07-20 NOTE — Progress Notes (Signed)
Patient here for monthly b12 injection per physicians orders.  Injection given in left deltoid and patient tolerated well. 

## 2021-07-21 ENCOUNTER — Ambulatory Visit (INDEPENDENT_AMBULATORY_CARE_PROVIDER_SITE_OTHER): Payer: PPO | Admitting: *Deleted

## 2021-07-21 DIAGNOSIS — E538 Deficiency of other specified B group vitamins: Secondary | ICD-10-CM

## 2021-07-21 MED ORDER — CYANOCOBALAMIN 1000 MCG/ML IJ SOLN
1000.0000 ug | Freq: Once | INTRAMUSCULAR | Status: AC
  Administered 2021-07-21: 11:00:00 1000 ug via INTRAMUSCULAR

## 2021-07-31 ENCOUNTER — Telehealth: Payer: Self-pay | Admitting: Family Medicine

## 2021-07-31 NOTE — Telephone Encounter (Signed)
Pt stated he has right leg pain. Prevs ov for same symps 9/16 and 9/20 with [facility]. Sched appt with np Murray 1/10 @ 11. Pt would like someone to reach out to him regard symps if possible before appt. Please advise.

## 2021-07-31 NOTE — Telephone Encounter (Signed)
Pt has appointment tomorrow.

## 2021-08-01 ENCOUNTER — Other Ambulatory Visit: Payer: Self-pay

## 2021-08-01 ENCOUNTER — Ambulatory Visit (INDEPENDENT_AMBULATORY_CARE_PROVIDER_SITE_OTHER): Payer: PPO | Admitting: Family

## 2021-08-01 ENCOUNTER — Ambulatory Visit (HOSPITAL_BASED_OUTPATIENT_CLINIC_OR_DEPARTMENT_OTHER)
Admission: RE | Admit: 2021-08-01 | Discharge: 2021-08-01 | Disposition: A | Discharge status: Home / Self Care | Payer: PPO | Source: Ambulatory Visit | Attending: Family | Admitting: Family

## 2021-08-01 ENCOUNTER — Other Ambulatory Visit: Payer: Self-pay | Admitting: Family

## 2021-08-01 VITALS — BP 118/72 | HR 77 | Resp 18 | Ht 70.0 in | Wt 191.4 lb

## 2021-08-01 DIAGNOSIS — S76111A Strain of right quadriceps muscle, fascia and tendon, initial encounter: Secondary | ICD-10-CM | POA: Diagnosis not present

## 2021-08-01 DIAGNOSIS — M79651 Pain in right thigh: Secondary | ICD-10-CM

## 2021-08-01 DIAGNOSIS — M8588 Other specified disorders of bone density and structure, other site: Secondary | ICD-10-CM | POA: Diagnosis not present

## 2021-08-01 DIAGNOSIS — M545 Low back pain, unspecified: Secondary | ICD-10-CM | POA: Diagnosis not present

## 2021-08-01 DIAGNOSIS — I8393 Asymptomatic varicose veins of bilateral lower extremities: Secondary | ICD-10-CM

## 2021-08-01 DIAGNOSIS — M5136 Other intervertebral disc degeneration, lumbar region: Secondary | ICD-10-CM | POA: Diagnosis not present

## 2021-08-01 MED ORDER — TRAMADOL HCL 50 MG PO TABS: 50.0000 mg | ORAL_TABLET | Freq: Three times a day (TID) | ORAL | 0 refills | Status: DC | PRN

## 2021-08-01 NOTE — Progress Notes (Signed)
Justin [name] is a 88 y.o. male with the following history as recorded in [employer]:  Patient Active Problem List   Diagnosis Date Noted   Dysuria 04/21/2020   Left hip pain 04/21/2020   Acute pain of right knee 10/01/2019   Acute midline low back pain without sciatica 05/26/2019   Dizziness 04/26/2017   Dehydration 04/26/2017   Allergic reaction caused by a drug 04/03/2017   Preventative health care 10/27/2016   Left flank pain 04/23/2016   Hypothyroidism 10/25/2015   Carpal tunnel syndrome 10/19/2015   Pain, joint, hand, right 08/22/2015   B12 deficiency 12/31/2011   Benign prostatic hyperplasia with urinary obstruction 09/25/2011   SKIN CANCER, HX OF 03/31/2010   DIVERTICULOSIS, COLON 05/10/2008   Hx of TIA (transient ischemic attack) and stroke 05/10/2008   History of colonic polyps 05/10/2008   NEPHROLITHIASIS, HX OF 05/10/2008   Hyperlipidemia LDL goal <100 09/24/2007   Anemia 12/25/2006    Current Outpatient Medications  Medication Sig Dispense Refill   aspirin 81 MG tablet Take 81 mg by mouth daily.     finasteride (PROSCAR) 5 MG tablet Take 1 tablet (5 mg total) by mouth daily. 90 tablet 1   lidocaine (LIDODERM) 5 % Place 1 patch onto the skin daily. Remove & Discard patch within 12 hours. 30 patch 0   simvastatin (ZOCOR) 20 MG tablet TAKE 1 TABLET BY MOUTH EVERY DAY 90 tablet 1   traMADol (ULTRAM) 50 MG tablet Take 1 tablet (50 mg total) by mouth every 8 (eight) hours as needed for up to 5 days. 15 tablet 0   No current facility-administered medications for this visit.    Allergies: Prednisone, Inderal [propranolol], and Omeprazole  Past Medical History:  Diagnosis Date   Anemia    Basal cell cancer    Dr Wilhemina Bonito   Benign prostatic hypertrophy    Dr Amalia Hailey   Carpal tunnel syndrome 10/19/2015   Right   Chronic kidney disease    Colon polyp    Diverticulosis    Hyperlipidemia    Kidney stones    Shingles    Stroke [facility])    Transient ischemic attack    Dr  Leonie Man    Past Surgical History:  Procedure Laterality Date   CATARACT EXTRACTION, BILATERAL     COLONOSCOPY W/ POLYPECTOMY  01/2009   adenomatous, Dr. Alben Spittle   HEMORRHOID SURGERY     LITHOTRIPSY      X 2;Dr. Amalia Hailey   PROSTATE BIOPSY     X2    Family History  Problem Relation Age of Onset   Heart failure Father    Heart attack Father 64   Diabetes Mother    Colon cancer Mother    Prostate cancer Brother    Stroke Brother    Coronary artery disease Brother        7 vessel CBAG   Diabetes Brother    Heart attack Other        2 P uncles , both > 13   Stroke Brother        in late 70s   Colon polyps Sister    Heart attack Brother    Other Sister        brain hemorrhage due to a fall    Social History   Tobacco Use   Smoking status: Former    Types: Cigarettes, Pipe, Cigars    Quit date: 07/23/1976    Years since quitting: 45.0   Smokeless tobacco: Former  Types: Chew   Tobacco comments:    smoked 1943-1978, up to < 1 ppd  Substance Use Topics   Alcohol use: No    Alcohol/week: 1.0 standard drink    Types: 1 Glasses of wine per week    Comment: rare wine    Subjective:  Accompanied by wife; recurrent right thigh pain; "same symptoms I had in September." No known injury or trauma; symptoms x 1 week- limited relief with Extra Strength Tylenol; describes as burning sensation radiating down into his feet;  Was referred to sports medicine in September but patient opted not to go;     Objective:  Vitals:   08/01/21 1114  BP: 118/72  Pulse: 77  Resp: 18  SpO2: 100%  Weight: 191 lb 6.4 oz (86.8 kg)  Height: 5\' 10"  (1.778 m)    General: Well developed, well nourished, in no acute distress  Skin : Warm and dry.  Head: Normocephalic and atraumatic  Lungs: Respirations unlabored; normoactive bowel sounds;  Musculoskeletal: No deformities; no active joint inflammation  Extremities: No edema, cyanosis, clubbing  Vessels: Symmetric bilaterally  Neurologic: Alert  and oriented; speech intact; face symmetrical; moves all extremities well; CNII-XII intact without focal deficit   Assessment:  1. Right thigh pain   2. Strain of right quadriceps, initial encounter     Plan:  ? Muscular vs vascular source; will update lumbar X-ray today; will go ahead and refer to sports medicine- could benefit from ultrasound; Does have some varicose veins noted on lower extremity- will refer to vascular specialist as well;  Short term refill updated on Tramadol;   This visit occurred during the SARS-CoV-2 public health emergency.  Safety protocols were in place, including screening questions prior to the visit, additional usage of staff PPE, and extensive cleaning of exam room while observing appropriate contact time as indicated for disinfecting solutions.    No follow-ups on file.  Orders Placed This Encounter  Procedures   DG Lumbar Spine Complete    Standing Status:   Future    Number of Occurrences:   1    Standing Expiration Date:   08/01/2022    Order Specific Question:   Reason for Exam (SYMPTOM  OR DIAGNOSIS REQUIRED)    Answer:   right thigh pain    Order Specific Question:   Preferred imaging location?    Answer:   [facility]    Requested Prescriptions   Signed Prescriptions Disp Refills   traMADol (ULTRAM) 50 MG tablet 15 tablet 0    Sig: Take 1 tablet (50 mg total) by mouth every 8 (eight) hours as needed for up to 5 days.

## 2021-08-02 ENCOUNTER — Ambulatory Visit (INDEPENDENT_AMBULATORY_CARE_PROVIDER_SITE_OTHER): Payer: PPO | Admitting: Family Medicine

## 2021-08-02 ENCOUNTER — Encounter: Payer: Self-pay | Admitting: Family Medicine

## 2021-08-02 VITALS — BP 120/82 | Ht 70.0 in | Wt 191.0 lb

## 2021-08-02 DIAGNOSIS — M5416 Radiculopathy, lumbar region: Secondary | ICD-10-CM

## 2021-08-02 MED ORDER — HYDROCODONE-ACETAMINOPHEN 5-325 MG PO TABS: 1.0000 | ORAL_TABLET | Freq: Three times a day (TID) | ORAL | 0 refills | Status: DC | PRN

## 2021-08-02 MED ORDER — PREDNISONE 5 MG PO TABS: ORAL_TABLET | ORAL | 0 refills | Status: DC

## 2021-08-02 NOTE — Assessment & Plan Note (Signed)
Symptoms most consistent with radicular type pain.  Has degenerative change appreciated in the lumbar spine. -Counseled on home exercise therapy and supportive care. -Prednisone.  Counseled on its use. -Norco. -Stop tramadol. -Could consider injection physical therapy.

## 2021-08-02 NOTE — Progress Notes (Signed)
°  Justin [name] - 88 y.o. male MRN [id number]  Date of birth: 1924/12/28  SUBJECTIVE:  Including CC & ROS.  No chief complaint on file.   Justin [name] is a 88 y.o. male that is presenting with right leg pain.  The pain originates over the lateral hip and radiates down to the foot.  It is intermittent in nature and has been severe over the past 2 weeks.  It is worse at night.  Has a burning sensation associated with it.  Reviewed the office note from 1/10 which shows he was provided short course of tramadol and referred to vascular. Independent review of the lumbar spine x-ray from 1/10 shows degenerative disc disease and facet arthrosis at multiple levels.  Review of Systems See HPI   HISTORY: Past Medical, Surgical, Social, and Family History Reviewed & Updated per EMR.   Pertinent Historical Findings include:  Past Medical History:  Diagnosis Date   Anemia    Basal cell cancer    Dr Wilhemina Bonito   Benign prostatic hypertrophy    Dr Amalia Hailey   Carpal tunnel syndrome 10/19/2015   Right   Chronic kidney disease    Colon polyp    Diverticulosis    Hyperlipidemia    Kidney stones    Shingles    Stroke [facility])    Transient ischemic attack    Dr Leonie Man    Past Surgical History:  Procedure Laterality Date   CATARACT EXTRACTION, BILATERAL     COLONOSCOPY W/ POLYPECTOMY  01/2009   adenomatous, Dr. Alben Spittle   HEMORRHOID SURGERY     LITHOTRIPSY      X 2;Dr. Amalia Hailey   PROSTATE BIOPSY     X2     PHYSICAL EXAM:  VS: BP 120/82 (BP Location: Left Arm, Patient Position: Sitting)    Ht 5\' 10"  (1.778 m)    Wt 191 lb (86.6 kg)    BMI 27.41 kg/m  Physical Exam Gen: NAD, alert, cooperative with exam, well-appearing MSK:  Neurovascularly intact       ASSESSMENT & PLAN:   Lumbar radiculopathy Symptoms most consistent with radicular type pain.  Has degenerative change appreciated in the lumbar spine. -Counseled on home exercise therapy and supportive care. -Prednisone.  Counseled on  its use. -Norco. -Stop tramadol. -Could consider injection physical therapy.

## 2021-08-02 NOTE — Patient Instructions (Signed)
Nice to meet you Please try heat  Please try the exercises  Please try the prednisone and let me know if you have a reaction to it.  Please use the norco for severe pain.  Pleas stop the tramadol   Please send me a message in MyChart with any questions or updates.  Please see me back in 1-2 weeks.   --Dr. Raeford Razor

## 2021-08-10 DIAGNOSIS — L570 Actinic keratosis: Secondary | ICD-10-CM | POA: Diagnosis not present

## 2021-08-10 DIAGNOSIS — L821 Other seborrheic keratosis: Secondary | ICD-10-CM | POA: Diagnosis not present

## 2021-08-22 ENCOUNTER — Ambulatory Visit (INDEPENDENT_AMBULATORY_CARE_PROVIDER_SITE_OTHER): Payer: PPO

## 2021-08-22 DIAGNOSIS — E538 Deficiency of other specified B group vitamins: Secondary | ICD-10-CM | POA: Diagnosis not present

## 2021-08-22 MED ORDER — CYANOCOBALAMIN 1000 MCG/ML IJ SOLN
1000.0000 ug | Freq: Once | INTRAMUSCULAR | Status: AC
  Administered 2021-08-22: 1000 ug via INTRAMUSCULAR

## 2021-08-22 NOTE — Progress Notes (Signed)
Pt here for monthly B12 injection per PCP orders.   B12 1081mcg administered IM in right deltoid, and pt tolerated injection well.  Next B12 injection scheduled for one month.  Justin [name]

## 2021-08-23 ENCOUNTER — Ambulatory Visit: Payer: PPO

## 2021-09-20 ENCOUNTER — Ambulatory Visit (INDEPENDENT_AMBULATORY_CARE_PROVIDER_SITE_OTHER): Payer: PPO

## 2021-09-20 DIAGNOSIS — E538 Deficiency of other specified B group vitamins: Secondary | ICD-10-CM | POA: Diagnosis not present

## 2021-09-20 MED ORDER — CYANOCOBALAMIN 1000 MCG/ML IJ SOLN
1000.0000 ug | Freq: Once | INTRAMUSCULAR | Status: AC
  Administered 2021-09-20: 1000 ug via INTRAMUSCULAR

## 2021-09-20 NOTE — Progress Notes (Signed)
.  Justin [name] is a 89 y.o. male  presents to the office today for a B12 injection, per physician's orders. ?Original order: per [name], DO ? ?cyanocobalamin, 1000 mg/ml IM was administered in left deltoid today.  ? ?Patient tolerated injection well.  ? ?Next appointment:   10/25/21 ?

## 2021-09-30 IMAGING — DX DG CHEST 1V PORT
1 series · 1 of 1 positions shown · non-contrast
Comparison: 12/13/2015.

CLINICAL DATA: Pt and pt wife c/o sudden onset weakness and
dizziness. Pt denies chest pain. Pt c/o SOB with the weakness. No
slurred speech. Pt wife states pt walks at baseline but unable to
walk at this time.

EXAM:
PORTABLE CHEST 1 VIEW

[chest ap]
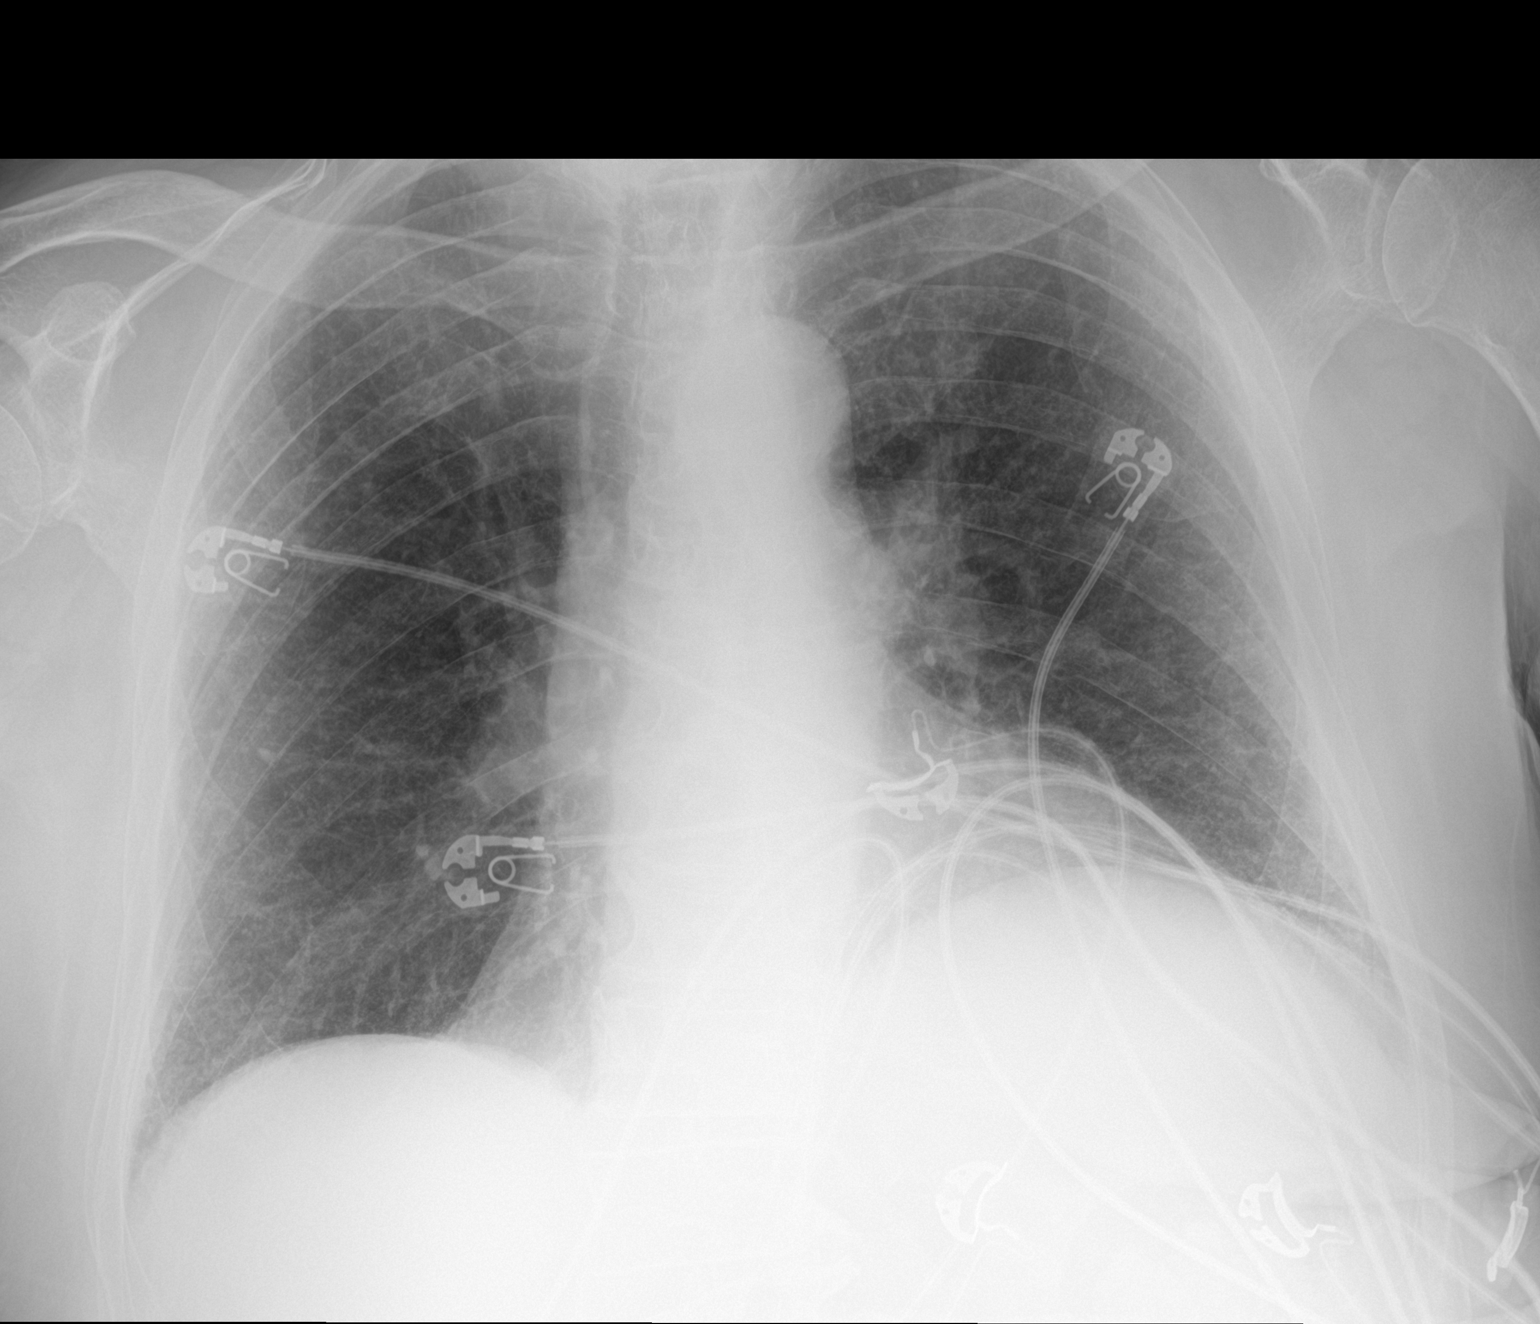

[1 of 1 positions shown; findings below may reference images not displayed]

FINDINGS: Cardiac silhouette is top-normal in size. No mediastinal or hilar
masses.

Lungs show diffuse bilateral interstitial thickening similar to the
prior exam. No evidence of pneumonia or pulmonary edema.

No pleural effusion or pneumothorax.

Skeletal structures are grossly intact.
IMPRESSION: No acute cardiopulmonary disease.

## 2021-10-25 ENCOUNTER — Ambulatory Visit: Payer: PPO

## 2021-10-25 NOTE — Progress Notes (Deleted)
?  Justin [name] is a 88 y.o. male  presents to the office today for a B12 injection, per physician's orders. ?Original order: per [name], DO ?  ?cyanocobalamin, 1000 mg/ml IM was administered in left deltoid today.  ?  ?Patient tolerated injection well.  ? ?Next appointment: 05 ?

## 2021-11-01 NOTE — Telephone Encounter (Signed)
error 

## 2021-11-09 ENCOUNTER — Ambulatory Visit (INDEPENDENT_AMBULATORY_CARE_PROVIDER_SITE_OTHER): Payer: PPO

## 2021-11-09 DIAGNOSIS — E538 Deficiency of other specified B group vitamins: Secondary | ICD-10-CM | POA: Diagnosis not present

## 2021-11-09 MED ORDER — CYANOCOBALAMIN 1000 MCG/ML IJ SOLN
1000.0000 ug | Freq: Once | INTRAMUSCULAR | Status: AC
  Administered 2021-11-09: 1000 ug via INTRAMUSCULAR

## 2021-11-09 NOTE — Progress Notes (Signed)
Justin [name] is a 88 y.o. male presents to the office today for B12:  injections, per physician's orders. ?Original order: per Dr. Etter Sjogren to continue monthly b12 injections.  ?Cyanocobalamin (med), 1000 mg/ml (dose),  im (route) was administered left deltoid (location) today. Patient tolerated injection. ?Patient due for follow up labs/provider appt:  ?Patient next injection due: in one month, appt made for 12-08-21 for B12 shot and 01-12-22 for 6 months follow up to see pcp. ? ?[name]  ?

## 2021-11-17 ENCOUNTER — Encounter: Payer: Self-pay | Admitting: Family Medicine

## 2021-11-17 ENCOUNTER — Ambulatory Visit (INDEPENDENT_AMBULATORY_CARE_PROVIDER_SITE_OTHER): Payer: PPO | Admitting: Family Medicine

## 2021-11-17 VITALS — BP 130/56 | HR 88 | Temp 97.9°F | Resp 16 | Ht 70.0 in | Wt 188.0 lb

## 2021-11-17 DIAGNOSIS — J302 Other seasonal allergic rhinitis: Secondary | ICD-10-CM | POA: Diagnosis not present

## 2021-11-17 DIAGNOSIS — J40 Bronchitis, not specified as acute or chronic: Secondary | ICD-10-CM

## 2021-11-17 MED ORDER — CYANOCOBALAMIN 1000 MCG/ML IJ SOLN: 1000.0000 ug | INTRAMUSCULAR | 1 refills | Status: DC

## 2021-11-17 MED ORDER — LEVOCETIRIZINE DIHYDROCHLORIDE 5 MG PO TABS: 5.0000 mg | ORAL_TABLET | Freq: Every evening | ORAL | 5 refills | Status: AC

## 2021-11-17 MED ORDER — FLUTICASONE PROPIONATE 50 MCG/ACT NA SUSP: 2.0000 | Freq: Every day | NASAL | 6 refills | Status: AC

## 2021-11-17 MED ORDER — AZITHROMYCIN 250 MG PO TABS: ORAL_TABLET | ORAL | 0 refills | Status: DC

## 2021-11-17 MED ORDER — "SYRINGE 25G X 5/8"" 3 ML MISC": 1 refills | Status: AC

## 2021-11-17 MED ORDER — BENZONATATE 200 MG PO CAPS: 200.0000 mg | ORAL_CAPSULE | Freq: Three times a day (TID) | ORAL | 0 refills | Status: DC | PRN

## 2021-11-17 NOTE — Progress Notes (Addendum)
? ?Subjective:  ? ?By signing my name below, I, Zite Okoli, attest that this documentation has been prepared under the direction and in the presence of [name], DO. 11/17/2021  ? ? Patient ID: Justin [name], male    DOB: 1925-03-25, 88 y.o.   MRN: [id number] ? ?Chief Complaint  ?Patient presents with  ? Cough  ?  Patient complains of cough  ? Sore Throat  ?  Patient complains of sore throat  ? ? ?HPI ?Patient is in today for an office visit. He is accompanied by his wife. ? ?His wife reports he started having allergies 2 weeks ago. He has a persistent dry cough and developed a sore throat. Endorses congestion and rhinorrhea. The cough is bad through the day. He has tried loratadine but it does not provide any relief.  ? ?His wife reports that her husband and her have been very low on energy. They often are fatigued and don't want to do anything. She is asking for supplements that will help to restore their energy. He gets B12 injections monthly. ? ?Past Medical History:  ?Diagnosis Date  ? Anemia   ? Basal cell cancer   ? Dr Wilhemina Bonito  ? Benign prostatic hypertrophy   ? Dr Amalia Hailey  ? Carpal tunnel syndrome 10/19/2015  ? Right  ? Chronic kidney disease   ? Colon polyp   ? Diverticulosis   ? Hyperlipidemia   ? Kidney stones   ? Shingles   ? Stroke [facility])   ? Transient ischemic attack   ? Dr Leonie Man  ? ? ?Past Surgical History:  ?Procedure Laterality Date  ? CATARACT EXTRACTION, BILATERAL    ? COLONOSCOPY W/ POLYPECTOMY  01/2009  ? adenomatous, Dr. Alben Spittle  ? HEMORRHOID SURGERY    ? LITHOTRIPSY    ?  X 2;DrAmalia Hailey  ? PROSTATE BIOPSY    ? X2  ? ? ?Family History  ?Problem Relation Age of Onset  ? Heart failure Father   ? Heart attack Father 30  ? Diabetes Mother   ? Colon cancer Mother   ? Prostate cancer Brother   ? Stroke Brother   ? Coronary artery disease Brother   ?     7 vessel CBAG  ? Diabetes Brother   ? Heart attack Other   ?     2 P uncles , both > 55  ? Stroke Brother   ?     in late 75s  ? Colon  polyps Sister   ? Heart attack Brother   ? Other Sister   ?     brain hemorrhage due to a fall  ? ? ?Social History  ? ?Socioeconomic History  ? Marital status: Married  ?  Spouse name: Not on file  ? Number of children: 4  ? Years of education: Not on file  ? Highest education level: Not on file  ?Occupational History  ? Occupation: Truck Geophysicist/field seismologist  ?  Employer: [employer]  ?Tobacco Use  ? Smoking status: Former  ?  Types: Cigarettes, Pipe, Cigars  ?  Quit date: 07/23/1976  ?  Years since quitting: 45.3  ? Smokeless tobacco: Former  ?  Types: Chew  ? Tobacco comments:  ?  smoked 1943-1978, up to < 1 ppd  ?Substance and Sexual Activity  ? Alcohol use: No  ?  Alcohol/week: 1.0 standard drink  ?  Types: 1 Glasses of wine per week  ?  Comment: rare wine  ?  Drug use: No  ? Sexual activity: Not on file  ?Other Topics Concern  ? Not on file  ?Social History Narrative  ? 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 ?Social Determinants of Health  ? ?Financial Resource Strain: Low Risk   ? Difficulty of Paying Living Expenses: Not hard at all  ?Food Insecurity: No Food Insecurity  ? Worried About Charity fundraiser in the Last Year: Never true  ? Ran Out of Food in the Last Year: Never true  ?Transportation Needs: No Transportation Needs  ? Lack of Transportation (Medical): No  ? Lack of Transportation (Non-Medical): No  ?Physical Activity: Inactive  ? Days of Exercise per Week: 0 days  ? Minutes of Exercise per Session: 0 min  ?Stress: No Stress Concern Present  ? Feeling of Stress : Not at all  ?Social Connections: Moderately Isolated  ? Frequency of Communication with Friends and Family: More than three times a week  ? Frequency of Social Gatherings with Friends and Family: More than three times a week  ? Attends Religious Services: Never  ? Active Member of Clubs or Organizations: No  ? Attends Archivist Meetings: Never  ? Marital  Status: Married  ?Intimate Partner Violence: Not At Risk  ? Fear of Current or Ex-Partner: No  ? Emotionally Abused: No  ? Physically Abused: No  ? Sexually Abused: No  ? ? ?Outpatient Medications Prior to Visit  ?Medication Sig Dispense Refill  ? aspirin 81 MG tablet Take 81 mg by mouth daily.    ? finasteride (PROSCAR) 5 MG tablet Take 1 tablet (5 mg total) by mouth daily. 90 tablet 1  ? HYDROcodone-acetaminophen (NORCO/VICODIN) 5-325 MG tablet Take 1 tablet by mouth every 8 (eight) hours as needed. 15 tablet 0  ? simvastatin (ZOCOR) 20 MG tablet TAKE 1 TABLET BY MOUTH EVERY DAY 90 tablet 1  ? lidocaine (LIDODERM) 5 % Place 1 patch onto the skin daily. Remove & Discard patch within 12 hours. 30 patch 0  ? predniSONE (DELTASONE) 5 MG tablet Take 6 pills for first day, 5 pills second day, 4 pills third day, 3 pills fourth day, 2 pills the fifth day, and 1 pill sixth day. 21 tablet 0  ? ?No facility-administered medications prior to visit.  ? ? ?Allergies  ?Allergen Reactions  ? Prednisone Hives  ?  Unknown ?REACTION: RASH  ? Inderal [Propranolol]   ?  When taken for tremor it caused "streaks of pain " across forehead  ? Omeprazole Rash  ? ? ?Review of Systems  ?Constitutional:  Positive for malaise/fatigue. Negative for fever.  ?HENT:  Positive for congestion and sore throat. Negative for ear pain, hearing loss and sinus pain.   ?     (+) rhinorrhea  ?Eyes:  Negative for blurred vision and pain.  ?Respiratory:  Positive for cough. Negative for sputum production, shortness of breath and wheezing.   ?Cardiovascular:  Negative for chest pain and palpitations.  ?Gastrointestinal:  Negative for blood in stool, constipation, diarrhea, nausea and vomiting.  ?Genitourinary:  Negative for dysuria, frequency, hematuria and urgency.  ?Musculoskeletal:  Negative for back pain, falls and myalgias.  ?Neurological:  Negative for dizziness, sensory change, loss of consciousness, weakness and headaches.  ?Endo/Heme/Allergies:   Positive for environmental allergies. Does not bruise/bleed easily.  ?Psychiatric/Behavioral:  Negative for depression and suicidal ideas. The patient is not nervous/anxious and  does not have insomnia.   ? ?   ?Objective:  ?  ?Physical Exam ?Vitals and nursing note reviewed.  ?Constitutional:   ?   General: He is not in acute distress. ?   Appearance: Normal appearance. He is not ill-appearing.  ?HENT:  ?   Head: Normocephalic and atraumatic.  ?   Right Ear: Tympanic membrane, ear canal and external ear normal.  ?   Left Ear: Tympanic membrane, ear canal and external ear normal.  ?Eyes:  ?   Pupils: Pupils are equal, round, and reactive to light.  ?Cardiovascular:  ?   Rate and Rhythm: Normal rate and regular rhythm.  ?   Pulses: Normal pulses.  ?   Heart sounds: No murmur heard. ?  No gallop.  ?Pulmonary:  ?   Effort: Pulmonary effort is normal. No respiratory distress.  ?   Breath sounds: Wheezing present. No rhonchi.  ?Abdominal:  ?   General: Bowel sounds are normal. There is no distension.  ?   Palpations: Abdomen is soft.  ?   Tenderness: There is no abdominal tenderness. There is no guarding.  ?   Hernia: No hernia is present.  ?Musculoskeletal:  ?   Cervical back: Neck supple.  ?Lymphadenopathy:  ?   Cervical: Cervical adenopathy present.  ?Skin: ?   General: Skin is warm and dry.  ?Neurological:  ?   Mental Status: He is alert and oriented to person, place, and time.  ?Psychiatric:     ?   Mood and Affect: Mood normal.  ? ? ?BP (!) 130/56 (BP Location: Left Arm, Patient Position: Sitting, Cuff Size: Small)   Pulse 88   Temp 97.9 ?F (36.6 ?C) (Oral)   Resp 16   Ht '5\' 10"'$  (1.778 m)   Wt 188 lb (85.3 kg)   SpO2 100%   BMI 26.98 kg/m?  ?Wt Readings from Last 3 Encounters:  ?11/17/21 188 lb (85.3 kg)  ?08/02/21 191 lb (86.6 kg)  ?08/01/21 191 lb 6.4 oz (86.8 kg)  ? ? ?Diabetic Foot Exam - Simple   ?No data filed ?  ? ?Lab Results  ?Component Value Date  ? WBC 6.4 12/05/2020  ? HGB 10.8 (L) 12/05/2020  ?  HCT 30.8 (L) 12/05/2020  ? PLT 195.0 12/05/2020  ? GLUCOSE 74 06/27/2021  ? CHOL 122 06/27/2021  ? TRIG 121.0 06/27/2021  ? HDL 45.90 06/27/2021  ? LDLDIRECT 49.0 12/05/2020  ? Falls 52 06/27/2021  ? ALT 13 12/06/

## 2021-11-17 NOTE — Patient Instructions (Signed)
Acute Bronchitis, Adult ? ?Acute bronchitis is sudden inflammation of the main airways (bronchi) that come off the windpipe (trachea) in the lungs. The swelling causes the airways to get smaller and make more mucus than normal. This can make it hard to breathe and can cause coughing or noisy breathing (wheezing). ?Acute bronchitis may last several weeks. The cough may last longer. Allergies, asthma, and exposure to smoke may make the condition worse. ?What are the causes? ?This condition can be caused by germs and by substances that irritate the lungs, including: ?Cold and flu viruses. The most common cause of this condition is the virus that causes the common cold. ?Bacteria. This is less common. ?Breathing in substances that irritate the lungs, including: ?Smoke from cigarettes and other forms of tobacco. ?Dust and pollen. ?Fumes from household cleaning products, gases, or burned fuel. ?Indoor or outdoor air pollution. ?What increases the risk? ?The following factors may make you more likely to develop this condition: ?A weak body's defense system, also called the immune system. ?A condition that affects your lungs and breathing, such as asthma. ?What are the signs or symptoms? ?Common symptoms of this condition include: ?Coughing. This may bring up clear, yellow, or green mucus from your lungs (sputum). ?Wheezing. ?Runny or stuffy nose. ?Having too much mucus in your lungs (chest congestion). ?Shortness of breath. ?Aches and pains, including sore throat or chest. ?How is this diagnosed? ?This condition is usually diagnosed based on: ?Your symptoms and medical history. ?A physical exam. ?You may also have other tests, including tests to rule out other conditions, such as pneumonia. These tests include: ?A test of lung function. ?Test of a mucus sample to look for the presence of bacteria. ?Tests to check the oxygen level in your blood. ?Blood tests. ?Chest X-ray. ?How is this treated? ?Most cases of acute  bronchitis clear up over time without treatment. Your health care provider may recommend: ?Drinking more fluids to help thin your mucus so it is easier to cough up. ?Taking inhaled medicine (inhaler) to improve air flow in and out of your lungs. ?Using a vaporizer or a humidifier. These are machines that add water to the air to help you breathe better. ?Taking a medicine that thins mucus and clears congestion (expectorant). ?Taking a medicine that prevents or stops coughing (cough suppressant). ?It is notcommon to take an antibiotic medicine for this condition. ?Follow these instructions at home: ? ?Take over-the-counter and prescription medicines only as told by your health care provider. ?Use an inhaler, vaporizer, or humidifier as told by your health care provider. ?Take two teaspoons (10 mL) of honey at bedtime to lessen coughing at night. ?Drink enough fluid to keep your urine pale yellow. ?Do not use any products that contain nicotine or tobacco. These products include cigarettes, chewing tobacco, and vaping devices, such as e-cigarettes. If you need help quitting, ask your health care provider. ?Get plenty of rest. ?Return to your normal activities as told by your health care provider. Ask your health care provider what activities are safe for you. ?Keep all follow-up visits. This is important. ?How is this prevented? ?To lower your risk of getting this condition again: ?Wash your hands often with soap and water for at least 20 seconds. If soap and water are not available, use hand sanitizer. ?Avoid contact with people who have cold symptoms. ?Try not to touch your mouth, nose, or eyes with your hands. ?Avoid breathing in smoke or chemical fumes. Breathing smoke or chemical fumes will make your   condition worse. ?Get the flu shot every year. ?Contact a health care provider if: ?Your symptoms do not improve after 2 weeks. ?You have trouble coughing up the mucus. ?Your cough keeps you awake at night. ?You have a  fever. ?Get help right away if you: ?Cough up blood. ?Feel pain in your chest. ?Have severe shortness of breath. ?Faint or keep feeling like you are going to faint. ?Have a severe headache. ?Have a fever or chills that get worse. ?These symptoms may represent a serious problem that is an emergency. Do not wait to see if the symptoms will go away. Get medical help right away. Call your local emergency services (911 in the [place] Do not drive yourself to the hospital. ?Summary ?Acute bronchitis is inflammation of the main airways (bronchi) that come off the windpipe (trachea) in the lungs. The swelling causes the airways to get smaller and make more mucus than normal. ?Drinking more fluids can help thin your mucus so it is easier to cough up. ?Take over-the-counter and prescription medicines only as told by your health care provider. ?Do not use any products that contain nicotine or tobacco. These products include cigarettes, chewing tobacco, and vaping devices, such as e-cigarettes. If you need help quitting, ask your health care provider. ?Contact a health care provider if your symptoms do not improve after 2 weeks. ?This information is not intended to replace advice given to you by your health care provider. Make sure you discuss any questions you have with your health care provider. ?Document Revised: 11/09/2020 Document Reviewed: 11/09/2020 ?Elsevier Patient Education ? 2023 Elsevier Inc. ? ?

## 2021-11-17 NOTE — Assessment & Plan Note (Signed)
z pak ?Tessalon perles for cough ?Antihistamine and flonase ?rto prn  ?

## 2021-11-21 ENCOUNTER — Other Ambulatory Visit: Payer: Self-pay | Admitting: Family Medicine

## 2021-11-21 DIAGNOSIS — E785 Hyperlipidemia, unspecified: Secondary | ICD-10-CM

## 2021-12-08 ENCOUNTER — Ambulatory Visit (INDEPENDENT_AMBULATORY_CARE_PROVIDER_SITE_OTHER): Payer: PPO

## 2021-12-08 DIAGNOSIS — E538 Deficiency of other specified B group vitamins: Secondary | ICD-10-CM

## 2021-12-08 MED ORDER — CYANOCOBALAMIN 1000 MCG/ML IJ SOLN
1000.0000 ug | Freq: Once | INTRAMUSCULAR | Status: AC
  Administered 2021-12-08: 1000 ug via INTRAMUSCULAR

## 2021-12-08 NOTE — Progress Notes (Signed)
Justin [name] is a 88 y.o. male presents to the office today for B12: injections, per physician's orders. Original order: per Dr. Etter Sjogren to continue monthly b12 injections.  Cyanocobalamin (med), 1000 mg/ml (dose), im (route) was administered left deltoid (location) today. Patient tolerated injection. Patient due for follow up labs/provider appt:  Patient next injection due: in one month, appt made for 12-08-21 for B12

## 2021-12-11 DIAGNOSIS — D224 Melanocytic nevi of scalp and neck: Secondary | ICD-10-CM | POA: Diagnosis not present

## 2021-12-11 DIAGNOSIS — L905 Scar conditions and fibrosis of skin: Secondary | ICD-10-CM | POA: Diagnosis not present

## 2021-12-11 DIAGNOSIS — D225 Melanocytic nevi of trunk: Secondary | ICD-10-CM | POA: Diagnosis not present

## 2021-12-11 DIAGNOSIS — L578 Other skin changes due to chronic exposure to nonionizing radiation: Secondary | ICD-10-CM | POA: Diagnosis not present

## 2021-12-11 DIAGNOSIS — L821 Other seborrheic keratosis: Secondary | ICD-10-CM | POA: Diagnosis not present

## 2021-12-11 DIAGNOSIS — L570 Actinic keratosis: Secondary | ICD-10-CM | POA: Diagnosis not present

## 2021-12-11 DIAGNOSIS — L820 Inflamed seborrheic keratosis: Secondary | ICD-10-CM | POA: Diagnosis not present

## 2021-12-25 DIAGNOSIS — H353113 Nonexudative age-related macular degeneration, right eye, advanced atrophic without subfoveal involvement: Secondary | ICD-10-CM | POA: Diagnosis not present

## 2021-12-25 DIAGNOSIS — Z961 Presence of intraocular lens: Secondary | ICD-10-CM | POA: Diagnosis not present

## 2021-12-25 DIAGNOSIS — H524 Presbyopia: Secondary | ICD-10-CM | POA: Diagnosis not present

## 2021-12-25 DIAGNOSIS — H353122 Nonexudative age-related macular degeneration, left eye, intermediate dry stage: Secondary | ICD-10-CM | POA: Diagnosis not present

## 2022-01-05 ENCOUNTER — Ambulatory Visit (INDEPENDENT_AMBULATORY_CARE_PROVIDER_SITE_OTHER): Payer: PPO

## 2022-01-05 DIAGNOSIS — E538 Deficiency of other specified B group vitamins: Secondary | ICD-10-CM

## 2022-01-05 MED ORDER — CYANOCOBALAMIN 1000 MCG/ML IJ SOLN
1000.0000 ug | Freq: Once | INTRAMUSCULAR | Status: AC
  Administered 2022-01-05: 1000 ug via INTRAMUSCULAR

## 2022-01-05 NOTE — Progress Notes (Cosign Needed)
Justin [name] is a 88 y.o. male presents to the office today for B12: injections, per physician's orders. Original order: per Dr. Etter Sjogren to continue monthly b12 injections.  Cyanocobalamin (med), 1000 mg/ml (dose), im (route) was administered left deltoid (location) today. Patient tolerated injection. Patient due for follow up labs/provider appt:  Patient next injection due: in one month, appt made

## 2022-01-09 ENCOUNTER — Ambulatory Visit: Payer: PPO | Admitting: Family Medicine

## 2022-01-11 ENCOUNTER — Ambulatory Visit: Payer: PPO | Admitting: Family Medicine

## 2022-01-12 ENCOUNTER — Ambulatory Visit: Payer: PPO | Admitting: Family Medicine

## 2022-01-18 ENCOUNTER — Encounter: Payer: Self-pay | Admitting: Family Medicine

## 2022-01-18 ENCOUNTER — Ambulatory Visit (INDEPENDENT_AMBULATORY_CARE_PROVIDER_SITE_OTHER): Payer: PPO | Admitting: Family Medicine

## 2022-01-18 VITALS — BP 128/60 | HR 70 | Temp 97.6°F | Resp 18 | Ht 70.0 in | Wt 182.4 lb

## 2022-01-18 DIAGNOSIS — N401 Enlarged prostate with lower urinary tract symptoms: Secondary | ICD-10-CM

## 2022-01-18 DIAGNOSIS — E785 Hyperlipidemia, unspecified: Secondary | ICD-10-CM

## 2022-01-18 DIAGNOSIS — N138 Other obstructive and reflux uropathy: Secondary | ICD-10-CM

## 2022-01-18 DIAGNOSIS — E039 Hypothyroidism, unspecified: Secondary | ICD-10-CM

## 2022-01-18 LAB — CBC WITH DIFFERENTIAL/PLATELET
Basophils Absolute: 0 10*3/uL (ref 0.0–0.1)
Basophils Relative: 0.6 % (ref 0.0–3.0)
Eosinophils Absolute: 0.2 10*3/uL (ref 0.0–0.7)
Eosinophils Relative: 2.7 % (ref 0.0–5.0)
HCT: 31.4 % — ABNORMAL LOW (ref 39.0–52.0)
Hemoglobin: 10.7 g/dL — ABNORMAL LOW (ref 13.0–17.0)
Lymphocytes Relative: 15 % (ref 12.0–46.0)
Lymphs Abs: 1.1 10*3/uL (ref 0.7–4.0)
MCHC: 34.2 g/dL (ref 30.0–36.0)
MCV: 103.5 fl — ABNORMAL HIGH (ref 78.0–100.0)
Monocytes Absolute: 0.8 10*3/uL (ref 0.1–1.0)
Monocytes Relative: 11.6 % (ref 3.0–12.0)
Neutro Abs: 5.1 10*3/uL (ref 1.4–7.7)
Neutrophils Relative %: 70.1 % (ref 43.0–77.0)
Platelets: 197 10*3/uL (ref 150.0–400.0)
RBC: 3.03 Mil/uL — ABNORMAL LOW (ref 4.22–5.81)
RDW: 13.8 % (ref 11.5–15.5)
WBC: 7.2 10*3/uL (ref 4.0–10.5)

## 2022-01-18 LAB — LIPID PANEL
Cholesterol: 103 mg/dL (ref 0–200)
HDL: 44.9 mg/dL (ref >39.00)
LDL Cholesterol: 42 mg/dL (ref 0–99)
NonHDL: 58.05
Total CHOL/HDL Ratio: 2
Triglycerides: 80 mg/dL (ref 0.0–149.0)
VLDL: 16 mg/dL (ref 0.0–40.0)

## 2022-01-18 LAB — COMPREHENSIVE METABOLIC PANEL
ALT: 10 U/L (ref 0–53)
AST: 16 U/L (ref 0–37)
Albumin: 3.7 g/dL (ref 3.5–5.2)
Alkaline Phosphatase: 72 U/L (ref 39–117)
BUN: 27 mg/dL — ABNORMAL HIGH (ref 6–23)
CO2: 27 mEq/L (ref 19–32)
Calcium: 8.6 mg/dL (ref 8.4–10.5)
Chloride: 105 mEq/L (ref 96–112)
Creatinine, Ser: 0.9 mg/dL (ref 0.40–1.50)
GFR: 73.06 mL/min (ref >60.00)
Glucose, Bld: 119 mg/dL — ABNORMAL HIGH (ref 70–99)
Potassium: 3.4 mEq/L — ABNORMAL LOW (ref 3.5–5.1)
Sodium: 139 mEq/L (ref 135–145)
Total Bilirubin: 0.6 mg/dL (ref 0.2–1.2)
Total Protein: 8.2 g/dL (ref 6.0–8.3)

## 2022-01-18 LAB — TSH: TSH: 4.76 u[IU]/mL (ref 0.35–5.50)

## 2022-01-18 LAB — PSA: PSA: 2.03 ng/mL (ref 0.10–4.00)

## 2022-01-18 MED ORDER — FINASTERIDE 5 MG PO TABS: 5.0000 mg | ORAL_TABLET | Freq: Every day | ORAL | 1 refills | Status: DC

## 2022-01-18 NOTE — Assessment & Plan Note (Signed)
On no med Check labs

## 2022-01-18 NOTE — Assessment & Plan Note (Signed)
Check labs 

## 2022-01-18 NOTE — Progress Notes (Signed)
Established Patient Office Visit  Subjective   Patient ID: Justin [name], male    DOB: 09-11-1924  Age: 88 y.o. MRN: [id number]  Chief Complaint  Patient presents with   Hyperlipidemia   Follow-up    HPI  Patient Active Problem List   Diagnosis Date Noted   Bronchitis 11/17/2021   Lumbar radiculopathy 08/02/2021   Dysuria 04/21/2020   Left hip pain 04/21/2020   Acute pain of right knee 10/01/2019   Acute midline low back pain without sciatica 05/26/2019   Dizziness 04/26/2017   Dehydration 04/26/2017   Allergic reaction caused by a drug 04/03/2017   Preventative health care 10/27/2016   Left flank pain 04/23/2016   Hypothyroidism 10/25/2015   Carpal tunnel syndrome 10/19/2015   Pain, joint, hand, right 08/22/2015   B12 deficiency 12/31/2011   Benign prostatic hyperplasia with urinary obstruction 09/25/2011   SKIN CANCER, HX OF 03/31/2010   DIVERTICULOSIS, COLON 05/10/2008   Hx of TIA (transient ischemic attack) and stroke 05/10/2008   History of colonic polyps 05/10/2008   NEPHROLITHIASIS, HX OF 05/10/2008   Hyperlipidemia LDL goal <100 09/24/2007   Anemia 12/25/2006   Past Medical History:  Diagnosis Date   Anemia    Basal cell cancer    Dr Wilhemina Bonito   Benign prostatic hypertrophy    Dr Amalia Hailey   Carpal tunnel syndrome 10/19/2015   Right   Chronic kidney disease    Colon polyp    Diverticulosis    Hyperlipidemia    Kidney stones    Shingles    Stroke ([place])    Transient ischemic attack    Dr Leonie Man   Past Surgical History:  Procedure Laterality Date   CATARACT EXTRACTION, BILATERAL     COLONOSCOPY W/ POLYPECTOMY  01/2009   adenomatous, Dr. Alben Spittle   HEMORRHOID SURGERY     LITHOTRIPSY      X 2;Dr. Amalia Hailey   PROSTATE BIOPSY     X2   Social History   Tobacco Use   Smoking status: Former    Types: Cigarettes, Pipe, Cigars    Quit date: 07/23/1976    Years since quitting: 45.5   Smokeless tobacco: Former    Types: Chew   Tobacco comments:     smoked [phone], up to < 1 ppd  Substance Use Topics   Alcohol use: No    Alcohol/week: 1.0 standard drink of alcohol    Types: 1 Glasses of wine per week    Comment: rare wine   Drug use: No   Social History   Socioeconomic History   Marital status: Married    Spouse name: Not on file   Number of children: 4   Years of education: Not on file   Highest education level: Not on file  Occupational History   Occupation: Truck Education administrator: [employer]  Tobacco Use   Smoking status: Former    Types: Cigarettes, Pipe, Cigars    Quit date: 07/23/1976    Years since quitting: 45.5   Smokeless tobacco: Former    Types: Chew   Tobacco comments:    smoked [phone], up to < 1 ppd  Substance and Sexual Activity   Alcohol use: No    Alcohol/week: 1.0 standard drink of alcohol    Types: 1 Glasses of wine per week    Comment: rare wine   Drug use: No   Sexual activity: Not on file  Other Topics Concern   Not on file  Social History Narrative   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, 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81   Social Determinants of Radio broadcast assistant Strain: [place]  (03/21/2021)   Overall Financial Resource Strain (CARDIA)    Difficulty of Paying Living Expenses: Not hard at all  Food Insecurity: No Food Insecurity (03/21/2021)   Hunger Vital Sign    Worried About Running Out of Food in the Last Year: Never true    [place] in the Last Year: Never true  Transportation Needs: No Transportation Needs (03/21/2021)   PRAPARE - Hydrologist (Medical): No    Lack of Transportation (Non-Medical): No  Physical Activity: Inactive (03/21/2021)   Exercise Vital Sign    Days of Exercise per Week: 0 days    Minutes of Exercise per Session: 0 min  Stress: No Stress Concern Present (03/21/2021)   [place]    Feeling of  Stress : Not at all  Social Connections: Moderately Isolated (03/21/2021)   Social Connection and Isolation Panel [NHANES]    Frequency of Communication with Friends and Family: More than three times a week    Frequency of Social Gatherings with Friends and Family: More than three times a week    Attends Religious Services: Never    Marine scientist or Organizations: No    Attends Archivist Meetings: Never    Marital Status: Married  Human resources officer Violence: Not At Risk (03/21/2021)   Humiliation, Afraid, Rape, and Kick questionnaire    Fear of Current or Ex-Partner: No    Emotionally Abused: No    Physically Abused: No    Sexually Abused: No   Family Status  Relation Name Status   Father  Deceased   Mother  Deceased   Brother  Deceased   Brother  (Not Specified)   Brother  (Not Specified)   Other  (Not Specified)   Brother  (Not Specified)   Sister  (Not Specified)   Brother  (Not Specified)   Sister  Deceased   Family History  Problem Relation Age of Onset   Heart failure Father    Heart attack Father 63   Diabetes Mother    Colon cancer Mother    Prostate cancer Brother    Stroke Brother    Coronary artery disease Brother        7 vessel CBAG   Diabetes Brother    Heart attack Other        2 P uncles , both > 73   Stroke Brother        in late 70s   Colon polyps Sister    Heart attack Brother    Other Sister        brain hemorrhage due to a fall   Allergies  Allergen Reactions   Prednisone Hives    Unknown REACTION: RASH   Inderal [Propranolol]     When taken for tremor it caused "streaks of pain " across forehead   Omeprazole Rash      Review of Systems  Constitutional:  Negative for fever and malaise/fatigue.  HENT:  Negative for congestion.   Eyes:  Negative for blurred vision.  Respiratory:  Negative for shortness of breath.   Cardiovascular:  Negative for chest pain, palpitations and leg swelling.  Gastrointestinal:  Negative  for abdominal pain, blood  in stool and nausea.  Genitourinary:  Negative for dysuria and frequency.  Musculoskeletal:  Negative for falls.  Skin:  Negative for rash.  Neurological:  Negative for dizziness, loss of consciousness and headaches.  Endo/Heme/Allergies:  Negative for environmental allergies.  Psychiatric/Behavioral:  Negative for depression. The patient is not nervous/anxious.       Objective:     BP 128/60 (BP Location: Left Arm, Patient Position: Sitting, Cuff Size: Normal)   Pulse 70   Temp 97.6 F (36.4 C) (Oral)   Resp 18   Ht '5\' 10"'$  (1.778 m)   Wt 182 lb 6.4 oz (82.7 kg)   SpO2 98%   BMI 26.17 kg/m  BP Readings from Last 3 Encounters:  01/18/22 128/60  11/17/21 (!) 130/56  08/02/21 120/82   Wt Readings from Last 3 Encounters:  01/18/22 182 lb 6.4 oz (82.7 kg)  11/17/21 188 lb (85.3 kg)  08/02/21 191 lb (86.6 kg)   SpO2 Readings from Last 3 Encounters:  01/18/22 98%  11/17/21 100%  08/01/21 100%      Physical Exam Vitals and nursing note reviewed.  Constitutional:      Appearance: He is well-developed.  HENT:     Head: Normocephalic and atraumatic.  Eyes:     Pupils: Pupils are equal, round, and reactive to light.  Neck:     Thyroid: No thyromegaly.  Cardiovascular:     Rate and Rhythm: Normal rate and regular rhythm.     Heart sounds: No murmur heard. Pulmonary:     Effort: Pulmonary effort is normal. No respiratory distress.     Breath sounds: Normal breath sounds. No wheezing or rales.  Chest:     Chest wall: No tenderness.  Musculoskeletal:     Cervical back: Normal range of motion and neck supple.     Right hip: Tenderness present. Normal range of motion. Normal strength.     Left hip: Tenderness present. Normal range of motion. Normal strength.     Right foot: Bony tenderness present. No swelling.     Left foot: Bony tenderness present. No swelling.  Skin:    General: Skin is warm and dry.  Neurological:     Mental Status: He is  alert and oriented to person, place, and time.  Psychiatric:        Behavior: Behavior normal.        Thought Content: Thought content normal.        Judgment: Judgment normal.      Results for orders placed or performed in visit on 01/18/22  CBC with Differential/Platelet  Result Value Ref Range   WBC 7.2 4.0 - 10.5 K/uL   RBC 3.03 (L) 4.22 - 5.81 Mil/uL   Hemoglobin 10.7 (L) 13.0 - 17.0 g/dL   HCT 31.4 (L) 39.0 - 52.0 %   MCV 103.5 (H) 78.0 - 100.0 fl   MCHC 34.2 30.0 - 36.0 g/dL   RDW 13.8 11.5 - 15.5 %   Platelets 197.0 150.0 - 400.0 K/uL   Neutrophils Relative % 70.1 43.0 - 77.0 %   Lymphocytes Relative 15.0 12.0 - 46.0 %   Monocytes Relative 11.6 3.0 - 12.0 %   Eosinophils Relative 2.7 0.0 - 5.0 %   Basophils Relative 0.6 0.0 - 3.0 %   Neutro Abs 5.1 1.4 - 7.7 K/uL   Lymphs Abs 1.1 0.7 - 4.0 K/uL   Monocytes Absolute 0.8 0.1 - 1.0 K/uL   Eosinophils Absolute 0.2 0.0 - 0.7 K/uL  Basophils Absolute 0.0 0.0 - 0.1 K/uL  Comprehensive metabolic panel  Result Value Ref Range   Sodium 139 135 - 145 mEq/L   Potassium 3.4 (L) 3.5 - 5.1 mEq/L   Chloride 105 96 - 112 mEq/L   CO2 27 19 - 32 mEq/L   Glucose, Bld 119 (H) 70 - 99 mg/dL   BUN 27 (H) 6 - 23 mg/dL   Creatinine, Ser 0.90 0.40 - 1.50 mg/dL   Total Bilirubin 0.6 0.2 - 1.2 mg/dL   Alkaline Phosphatase 72 39 - 117 U/L   AST 16 0 - 37 U/L   ALT 10 0 - 53 U/L   Total Protein 8.2 6.0 - 8.3 g/dL   Albumin 3.7 3.5 - 5.2 g/dL   GFR 73.06 >60.00 mL/min   Calcium 8.6 8.4 - 10.5 mg/dL  Lipid panel  Result Value Ref Range   Cholesterol 103 0 - 200 mg/dL   Triglycerides 80.0 0.0 - 149.0 mg/dL   HDL 44.90 >39.00 mg/dL   VLDL 16.0 0.0 - 40.0 mg/dL   LDL Cholesterol 42 0 - 99 mg/dL   Total CHOL/HDL Ratio 2    NonHDL 58.05   PSA  Result Value Ref Range   PSA 2.03 0.10 - 4.00 ng/mL  TSH  Result Value Ref Range   TSH 4.76 0.35 - 5.50 uIU/mL    5Last CBC Lab Results  Component Value Date   WBC 7.2 01/18/2022   HGB  10.7 (L) 01/18/2022   HCT 31.4 (L) 01/18/2022   MCV 103.5 (H) 01/18/2022   MCH 35.6 (H) 11/23/2020   RDW 13.8 01/18/2022   PLT 197.0 29/51/8841   Last metabolic panel Lab Results  Component Value Date   GLUCOSE 119 (H) 01/18/2022   NA 139 01/18/2022   K 3.4 (L) 01/18/2022   CL 105 01/18/2022   CO2 27 01/18/2022   BUN 27 (H) 01/18/2022   CREATININE 0.90 01/18/2022   GFRNONAA >60 11/23/2020   CALCIUM 8.6 01/18/2022   PROT 8.2 01/18/2022   ALBUMIN 3.7 01/18/2022   BILITOT 0.6 01/18/2022   ALKPHOS 72 01/18/2022   AST 16 01/18/2022   ALT 10 01/18/2022   ANIONGAP 6 11/23/2020   Last lipids Lab Results  Component Value Date   CHOL 103 01/18/2022   HDL 44.90 01/18/2022   LDLCALC 42 01/18/2022   LDLDIRECT 49.0 12/05/2020   TRIG 80.0 01/18/2022   CHOLHDL 2 01/18/2022   Last hemoglobin A1c Lab Results  Component Value Date   HGBA1C 5.4 01/10/2016   Last thyroid functions Lab Results  Component Value Date   TSH 4.76 01/18/2022   T4TOTAL 5.7 10/06/2018   Last vitamin D No results found for: "25OHVITD2", "25OHVITD3", "VD25OH" Last vitamin B12 and Folate Lab Results  Component Value Date   VITAMINB12 308 12/05/2020   FOLATE 15.8 01/06/2018      The ASCVD Risk score (Arnett DK, et al., 2019) failed to calculate for the following reasons:   The 2019 ASCVD risk score is only valid for ages 70 to 52    Assessment & Plan:   Problem List Items Addressed This Visit       Unprioritized   Hypothyroidism    On no med Check labs      Relevant Orders   TSH (Completed)   Hyperlipidemia LDL goal <100    Tolerating statin, encouraged heart healthy diet, avoid trans fats, minimize simple carbs and saturated fats. Increase exercise as tolerated      Benign prostatic  hyperplasia with urinary obstruction    Check labs       Relevant Medications   finasteride (PROSCAR) 5 MG tablet   Other Relevant Orders   PSA (Completed)   Other Visit Diagnoses      Hyperlipidemia, unspecified hyperlipidemia type    -  Primary   Relevant Orders   CBC with Differential/Platelet (Completed)   Comprehensive metabolic panel (Completed)   Lipid panel (Completed)       Return in about 6 months (around 07/20/2022), or if symptoms worsen or fail to improve, for fasting, annual exam.    [name], DO

## 2022-01-18 NOTE — Assessment & Plan Note (Signed)
Tolerating statin, encouraged heart healthy diet, avoid trans fats, minimize simple carbs and saturated fats. Increase exercise as tolerated 

## 2022-01-18 NOTE — Patient Instructions (Signed)
Cholesterol Content in Foods ?Cholesterol is a waxy, fat-like substance that helps to carry fat in the blood. The body needs cholesterol in small amounts, but too much cholesterol can cause damage to the arteries and heart. ?What foods have cholesterol? ? ?Cholesterol is found in animal-based foods, such as meat, seafood, and dairy. Generally, low-fat dairy and lean meats have less cholesterol than full-fat dairy and fatty meats. The milligrams of cholesterol per serving (mg per serving) of common cholesterol-containing foods are listed below. ?Meats and other proteins ?Egg -- one large whole egg has 186 mg. ?Veal shank -- 4 oz (113 g) has 141 mg. ?Lean ground turkey (93% lean) -- 4 oz (113 g) has 118 mg. ?Fat-trimmed lamb loin -- 4 oz (113 g) has 106 mg. ?Lean ground beef (90% lean) -- 4 oz (113 g) has 100 mg. ?Lobster -- 3.5 oz (99 g) has 90 mg. ?Pork loin chops -- 4 oz (113 g) has 86 mg. ?Canned salmon -- 3.5 oz (99 g) has 83 mg. ?Fat-trimmed beef top loin -- 4 oz (113 g) has 78 mg. ?Frankfurter -- 1 frank (3.5 oz or 99 g) has 77 mg. ?Crab -- 3.5 oz (99 g) has 71 mg. ?Roasted chicken without skin, white meat -- 4 oz (113 g) has 66 mg. ?Light bologna -- 2 oz (57 g) has 45 mg. ?Deli-cut turkey -- 2 oz (57 g) has 31 mg. ?Canned tuna -- 3.5 oz (99 g) has 31 mg. ?Bacon -- 1 oz (28 g) has 29 mg. ?Oysters and mussels (raw) -- 3.5 oz (99 g) has 25 mg. ?Mackerel -- 1 oz (28 g) has 22 mg. ?Trout -- 1 oz (28 g) has 20 mg. ?Pork sausage -- 1 link (1 oz or 28 g) has 17 mg. ?Salmon -- 1 oz (28 g) has 16 mg. ?Tilapia -- 1 oz (28 g) has 14 mg. ?Dairy ?Soft-serve ice cream -- ? cup (4 oz or 86 g) has 103 mg. ?Whole-milk yogurt -- 1 cup (8 oz or 245 g) has 29 mg. ?Cheddar cheese -- 1 oz (28 g) has 28 mg. ?American cheese -- 1 oz (28 g) has 28 mg. ?Whole milk -- 1 cup (8 oz or 250 mL) has 23 mg. ?2% milk -- 1 cup (8 oz or 250 mL) has 18 mg. ?Cream cheese -- 1 tablespoon (Tbsp) (14.5 g) has 15 mg. ?Cottage cheese -- ? cup (4 oz or  113 g) has 14 mg. ?Low-fat (1%) milk -- 1 cup (8 oz or 250 mL) has 10 mg. ?Sour cream -- 1 Tbsp (12 g) has 8.5 mg. ?Low-fat yogurt -- 1 cup (8 oz or 245 g) has 8 mg. ?Nonfat Greek yogurt -- 1 cup (8 oz or 228 g) has 7 mg. ?Half-and-half cream -- 1 Tbsp (15 mL) has 5 mg. ?Fats and oils ?Cod liver oil -- 1 tablespoon (Tbsp) (13.6 g) has 82 mg. ?Butter -- 1 Tbsp (14 g) has 15 mg. ?Lard -- 1 Tbsp (12.8 g) has 14 mg. ?Bacon grease -- 1 Tbsp (12.9 g) has 14 mg. ?Mayonnaise -- 1 Tbsp (13.8 g) has 5-10 mg. ?Margarine -- 1 Tbsp (14 g) has 3-10 mg. ?The items listed above may not be a complete list of foods with cholesterol. Exact amounts of cholesterol in these foods may vary depending on specific ingredients and brands. Contact a dietitian for more information. ?What foods do not have cholesterol? ?Most plant-based foods do not have cholesterol unless you combine them with a food that has   cholesterol. Foods without cholesterol include: ?Grains and cereals. ?Vegetables. ?Fruits. ?Vegetable oils, such as olive, canola, and sunflower oil. ?Legumes, such as peas, beans, and lentils. ?Nuts and seeds. ?Egg whites. ?The items listed above may not be a complete list of foods that do not have cholesterol. Contact a dietitian for more information. ?Summary ?The body needs cholesterol in small amounts, but too much cholesterol can cause damage to the arteries and heart. ?Cholesterol is found in animal-based foods, such as meat, seafood, and dairy. Generally, low-fat dairy and lean meats have less cholesterol than full-fat dairy and fatty meats. ?This information is not intended to replace advice given to you by your health care provider. Make sure you discuss any questions you have with your health care provider. ?Document Revised: 11/18/2020 Document Reviewed: 11/18/2020 ?Elsevier Patient Education ? 2023 Elsevier Inc. ? ?

## 2022-01-24 ENCOUNTER — Other Ambulatory Visit: Payer: Self-pay | Admitting: Family Medicine

## 2022-01-24 DIAGNOSIS — D649 Anemia, unspecified: Secondary | ICD-10-CM

## 2022-02-01 ENCOUNTER — Other Ambulatory Visit: Payer: Self-pay

## 2022-02-01 ENCOUNTER — Telehealth: Payer: Self-pay | Admitting: Family Medicine

## 2022-02-01 ENCOUNTER — Emergency Department (HOSPITAL_BASED_OUTPATIENT_CLINIC_OR_DEPARTMENT_OTHER): Payer: PPO

## 2022-02-01 ENCOUNTER — Encounter (HOSPITAL_BASED_OUTPATIENT_CLINIC_OR_DEPARTMENT_OTHER): Payer: Self-pay | Admitting: Emergency Medicine

## 2022-02-01 ENCOUNTER — Emergency Department (HOSPITAL_BASED_OUTPATIENT_CLINIC_OR_DEPARTMENT_OTHER)
Admission: EM | Admit: 2022-02-01 | Discharge: 2022-02-01 | Disposition: A | Discharge status: Home / Self Care | Payer: PPO | Attending: Emergency Medicine | Admitting: Emergency Medicine

## 2022-02-01 DIAGNOSIS — Z7982 Long term (current) use of aspirin: Secondary | ICD-10-CM | POA: Insufficient documentation

## 2022-02-01 DIAGNOSIS — M25562 Pain in left knee: Secondary | ICD-10-CM | POA: Diagnosis not present

## 2022-02-01 DIAGNOSIS — M175 Other unilateral secondary osteoarthritis of knee: Secondary | ICD-10-CM | POA: Insufficient documentation

## 2022-02-01 DIAGNOSIS — M79662 Pain in left lower leg: Secondary | ICD-10-CM | POA: Diagnosis not present

## 2022-02-01 DIAGNOSIS — M1712 Unilateral primary osteoarthritis, left knee: Secondary | ICD-10-CM | POA: Diagnosis not present

## 2022-02-01 MED ORDER — IBUPROFEN 800 MG PO TABS
800.0000 mg | ORAL_TABLET | Freq: Once | ORAL | Status: AC
  Administered 2022-02-01: 800 mg via ORAL
  Filled 2022-02-01: qty 1

## 2022-02-01 MED ORDER — IBUPROFEN 800 MG PO TABS: 800.0000 mg | ORAL_TABLET | Freq: Three times a day (TID) | ORAL | 0 refills | Status: AC

## 2022-02-01 NOTE — Discharge Instructions (Signed)
Today x-ray of your left knee demonstrated osteoarthritis.  Please take Motrin 800 mg every 8 hours for the next week.  Ultrasound mistreated no deep vein thrombosis (clot in the leg).  No arterial occlusion.  No signs of infection or soft tissue swelling.

## 2022-02-01 NOTE — Telephone Encounter (Signed)
Pt at the ED 

## 2022-02-01 NOTE — ED Triage Notes (Signed)
Patient presents to ED via POV from home. Here with left leg pain that began on Friday. Denies injury or trauma. Ambulatory.

## 2022-02-01 NOTE — Telephone Encounter (Signed)
Pt stated his leg has been cold for the past week with pain. Transferred to triage.

## 2022-02-01 NOTE — ED Provider Notes (Signed)
[facility] DEPART[facility] Provider Note   CSN: [id number] Arrival date & time: 02/01/22  8299     History  Chief Complaint  Patient presents with   Leg Pain    Ammon Muscatello is a 88 y.o. male.  Patient is a 88 year old male presenting for left lower leg pain.  Patient admits to left lower leg pain and coolness to the leg over the last 6 days.  Denies any swelling, redness, rashes, or wounds.  Denies any falls or injuries.  States the pain is in the left middle outer thigh, left knee, and radiates down to the left toes.  Pain is moderate to severe.  No prior history of DVT or PE.  Not currently on blood thinners.  Denies any weakness or numbness.  The history is provided by the patient. No language interpreter was used.  Leg Pain Associated symptoms: no fever        Home Medications Prior to Admission medications   Medication Sig Start Date End Date Taking? Authorizing Provider  ibuprofen (ADVIL) 800 MG tablet Take 1 tablet (800 mg total) by mouth every 8 (eight) hours for 7 days. 02/01/22 3/71/69 Yes Campbell Stall P, DO  aspirin 81 MG tablet Take 81 mg by mouth daily.    [provider]  cyanocobalamin (,VITAMIN B-12,) 1000 MCG/ML injection Inject 1 mL (1,000 mcg total) into the muscle every 14 (fourteen) days. 11/17/21   [name], DO  finasteride (PROSCAR) 5 MG tablet Take 1 tablet (5 mg total) by mouth daily. 01/18/22   Roma Schanz R, DO  fluticasone (FLONASE) 50 MCG/ACT nasal spray Place 2 sprays into both nostrils daily. 11/17/21   [name], DO  HYDROcodone-acetaminophen (NORCO/VICODIN) 5-325 MG tablet Take 1 tablet by mouth every 8 (eight) hours as needed. 08/02/21   Rosemarie Ax, MD  levocetirizine (XYZAL) 5 MG tablet Take 1 tablet (5 mg total) by mouth every evening. 11/17/21   Roma Schanz R, DO  simvastatin (ZOCOR) 20 MG tablet TAKE 1 TABLET BY MOUTH EVERY DAY 11/21/21   Carollee Herter, Alferd Apa, DO   Syringe/Needle, Disp, (SYRINGE 3CC/25GX5/8") 25G X 5/8" 3 ML MISC Use with b12 every 2 weeks 11/17/21   [name], DO      Allergies    Prednisone, Inderal [propranolol], and Omeprazole    Review of Systems   Review of Systems  Constitutional:  Negative for chills and fever.  Respiratory:  Negative for chest tightness and shortness of breath.   Cardiovascular:  Negative for leg swelling.  Skin:  Negative for color change, pallor, rash and wound.  Neurological:  Negative for weakness and numbness.    Physical Exam Updated Vital Signs BP (!) 175/70   Pulse 65   Temp 98 F (36.7 C)   Resp 17   SpO2 100%  Physical Exam Vitals and nursing note reviewed.  Constitutional:      General: He is not in acute distress.    Appearance: He is well-developed.  HENT:     Head: Normocephalic and atraumatic.  Eyes:     Conjunctiva/sclera: Conjunctivae normal.  Cardiovascular:     Rate and Rhythm: Normal rate and regular rhythm.     Pulses:          Dorsalis pedis pulses are 2+ on the right side and 2+ on the left side.     Heart sounds: No murmur heard. Pulmonary:     Effort: Pulmonary effort  is normal. No respiratory distress.     Breath sounds: Normal breath sounds.  Abdominal:     Palpations: Abdomen is soft.     Tenderness: There is no abdominal tenderness.  Musculoskeletal:        General: No swelling.     Cervical back: Neck supple.     Right hip: Normal.     Left hip: Normal.     Right upper leg: Normal.     Left upper leg: Normal.     Right knee: Normal.     Left knee: Bony tenderness present.     Right lower leg: Normal.     Left lower leg: Normal.     Right ankle: Normal.     Left ankle: Normal.     Right foot: Normal.     Left foot: Normal.  Skin:    General: Skin is warm and dry.     Capillary Refill: Capillary refill takes less than 2 seconds.  Neurological:     Mental Status: He is alert and oriented to person, place, and time.     GCS: GCS eye  subscore is 4. GCS verbal subscore is 5. GCS motor subscore is 6.     Sensory: Sensation is intact.     Motor: Motor function is intact.  Psychiatric:        Mood and Affect: Mood normal.     ED Results / Procedures / Treatments   Labs (all labs ordered are listed, but only abnormal results are displayed) Labs Reviewed - No data to display  EKG None  Radiology US Venous Img Lower Unilateral Left  Result Date: 02/01/2022 CLINICAL DATA:  Left lower extremity pain EXAM: LEFT LOWER EXTREMITY VENOUS DOPPLER ULTRASOUND TECHNIQUE: Zarielle Cea-scale sonography with compression, as well as color and duplex ultrasound, were performed to evaluate the deep venous system(s) from the level of the common femoral vein through the popliteal and proximal calf veins. COMPARISON:  None Available. FINDINGS: VENOUS Normal compressibility of the common femoral, superficial femoral, and popliteal veins, as well as the visualized calf veins. Visualized portions of profunda femoral vein and great saphenous vein unremarkable. No filling defects to suggest DVT on grayscale or color Doppler imaging. Doppler waveforms show normal direction of venous flow, normal respiratory plasticity and response to augmentation. Limited views of the contralateral common femoral vein are unremarkable. OTHER None. Limitations: none IMPRESSION: Negative. Electronically Signed   By: Rolm Baptise M.D.   On: 02/01/2022 18:57   DG Knee 2 Views Left  Result Date: 02/01/2022 CLINICAL DATA:  Left knee pain.  No history of trauma. EXAM: LEFT KNEE - 1-2 VIEW COMPARISON:  None available FINDINGS: Mild chronic enthesopathic change at the quadriceps insertion on the patella. Minimal medial compartment joint space narrowing. No acute fracture or dislocation. IMPRESSION: Minimal medial compartment osteoarthritis. Electronically Signed   By: Yvonne Kendall M.D.   On: 02/01/2022 18:30    Procedures Procedures    Medications Ordered in ED Medications   ibuprofen (ADVIL) tablet 800 mg (800 mg Oral Given 02/01/22 1748)    ED Course/ Medical Decision Making/ A&P                           Medical Decision Making Amount and/or Complexity of Data Reviewed Radiology: ordered.  Risk Prescription drug management.   79:72 AM  88 year old male presenting for left lower leg pain.  Patient admits to left lower leg pain and coolness to  the leg over the last 6 days.  Patient is alert and oriented x3, no acute distress, afebrile, stable vital signs.  Patient has bilateral dorsalis pedis pulses +2.  Low suspicion arterial occlusion.  No significant swelling, erythema, warmth.  No wounds or drainage.  No rashes.  Patient has tenderness palpation of the knee joint.  No ligamentous laxity.  X-ray of the knee ordered for possible osteoarthritis.  The knee is not warm and he does not have pain with passive range of motion.  Doubt septic arthritis.  We will also get venous ultrasound to rule out DVT due to vague overall leg pain.        Final Clinical Impression(s) / ED Diagnoses Final diagnoses:  Other secondary osteoarthritis of left knee    Rx / DC Orders ED Discharge Orders          Ordered    ibuprofen (ADVIL) 800 MG tablet  Every 8 hours        02/01/22 1911              Lianne Cure, DO 77/41/28 0006

## 2022-02-02 ENCOUNTER — Ambulatory Visit (INDEPENDENT_AMBULATORY_CARE_PROVIDER_SITE_OTHER): Payer: PPO

## 2022-02-02 DIAGNOSIS — E538 Deficiency of other specified B group vitamins: Secondary | ICD-10-CM

## 2022-02-02 MED ORDER — CYANOCOBALAMIN 1000 MCG/ML IJ SOLN
1000.0000 ug | Freq: Once | INTRAMUSCULAR | Status: AC
  Administered 2022-02-02: 1000 ug via INTRAMUSCULAR

## 2022-02-02 NOTE — Telephone Encounter (Signed)
Nurse Assessment Nurse: Sheppard Plumber, RN, Estill Bamberg Date/Time (Eastern Time): 02/01/2022 3:17:00 PM Confirm and document reason for call. If symptomatic, describe symptoms. ---caller states his leg feels cold since sat or so. also hurts at times. he did yard work yesterday and it did not hurt as bad. exercise helps the pain. Does the patient have any new or worsening symptoms? ---Yes Will a triage be completed? ---Yes Related visit to physician within the last 2 weeks? ---No Does the PT have any chronic conditions? (i.e. diabetes, asthma, this includes High risk factors for pregnancy, etc.) ---No Is this a behavioral health or substance abuse call? ---No Guidelines Guideline Title Affirmed Question Affirmed Notes Nurse Date/Time Eilene Ghazi Time) Leg Pain [1] SEVERE pain (e.g., excruciating, unable to do any normal activities) AND [2] not improved after 2 hours of pain medicine Humfleet, RN, Estill Bamberg 02/01/2022 3:18:52 PM PLEASE NOTE: All timestamps contained within this report are represented as [place] Standard Time. CONFIDENTIALTY NOTICE: This fax transmission is intended only for the addressee. It contains information that is legally privileged, confidential or otherwise protected from use or disclosure. If you are not the intended recipient, you are strictly prohibited from reviewing, disclosing, copying using or disseminating any of this information or taking any action in reliance on or regarding this information. If you have received this fax in error, please notify us immediately by telephone so that we can arrange for its return to [place]. Phone: [phone], Toll-Free: [phone], Fax: [phone] Page: 2 of 2 Call Id: [id number] [place]. Time Eilene Ghazi Time) Disposition Final User 02/01/2022 3:23:03 PM See HCP within 4 Hours (or PCP triage) Yes Humfleet, RN, Estill Bamberg Final Disposition 02/01/2022 3:23:03 PM See HCP within 4 Hours (or PCP triage) Yes [place]et, RN, Shelly Coss  Disagree/Comply Comply Caller Understands Yes PreDisposition InappropriateToAsk Care Advice Given Per Guideline SEE HCP (OR PCP TRIAGE) WITHIN 4 HOURS: * IF OFFICE WILL BE OPEN: You need to be seen within the next 3 or 4 hours. Call your doctor (or NP/PA) now or as soon as the office opens. CARE ADVICE given per Leg Pain (Adult) guideline. * You become worse CALL BACK IF: Referrals [facility] - ED

## 2022-02-02 NOTE — Progress Notes (Signed)
Justin [name] is a 88 y.o. male  presents to the office today for a B12 injection, per physician's orders. Original order: per Carollee Herter, Alferd Apa, DO  cyanocobalamin, 1000 mg/ml IM was administered in right deltoid today.   Patient tolerated injection well.   Next appointment:  03/09/22

## 2022-02-05 ENCOUNTER — Ambulatory Visit (INDEPENDENT_AMBULATORY_CARE_PROVIDER_SITE_OTHER): Payer: PPO | Admitting: Family Medicine

## 2022-02-05 ENCOUNTER — Ambulatory Visit: Payer: Self-pay

## 2022-02-05 ENCOUNTER — Encounter: Payer: Self-pay | Admitting: Family Medicine

## 2022-02-05 VITALS — BP 132/70 | HR 76 | Temp 97.6°F | Resp 18 | Ht 70.0 in | Wt 184.4 lb

## 2022-02-05 VITALS — BP 120/70 | HR 72 | Ht 70.0 in | Wt 184.0 lb

## 2022-02-05 DIAGNOSIS — M25562 Pain in left knee: Secondary | ICD-10-CM

## 2022-02-05 DIAGNOSIS — M1712 Unilateral primary osteoarthritis, left knee: Secondary | ICD-10-CM | POA: Diagnosis not present

## 2022-02-05 MED ORDER — TRAMADOL HCL 50 MG PO TABS: 50.0000 mg | ORAL_TABLET | Freq: Four times a day (QID) | ORAL | 0 refills | Status: DC | PRN

## 2022-02-05 NOTE — Assessment & Plan Note (Signed)
Deg left knee but mild for patient age.  Patient is very active otherwise.  We discussed with patient icing regimen and home exercises.  Patient does have tramadol from primary care provider but encouraged him to be careful.  We discussed icing regimen.  Discussed potential bracing. Patient declined.  HEP given  RTC in 4-6 weeks and can consider injection if needed.

## 2022-02-05 NOTE — Patient Instructions (Addendum)
Walk on flat surfaces Exercises 3x a week Ice for 91mn after activity Wear brace with activity Voltaren gel See me in 6 weeks if needed

## 2022-02-05 NOTE — Patient Instructions (Addendum)
Dr Charlann Boxer 3:15pm at [place] on [facility]    Acute Knee Pain, Adult Acute knee pain is sudden and may be caused by damage, swelling, or irritation of the muscles and tissues that support the knee. Pain may result from: A fall. An injury to the knee from twisting motions. A hit to the knee. Infection. Acute knee pain may go away on its own with time and rest. If it does not, your health care provider may order tests to find the cause of the pain. These may include: Imaging tests, such as an X-ray, MRI, CT scan, or ultrasound. Joint aspiration. In this test, fluid is removed from the knee and evaluated. Arthroscopy. In this test, a lighted tube is inserted into the knee and an image is projected onto a TV screen. Biopsy. In this test, a sample of tissue is removed from the body and studied under a microscope. Follow these instructions at home: If you have a knee sleeve or brace:  Wear the knee sleeve or brace as told by your health care provider. Remove it only as told by your health care provider. Loosen it if your toes tingle, become numb, or turn cold and blue. Keep it clean. If the knee sleeve or brace is not waterproof: Do not let it get wet. Cover it with a watertight covering when you take a bath or shower. Activity Rest your knee. Do not do things that cause pain or make pain worse. Avoid high-impact activities or exercises, such as running, jumping rope, or doing jumping jacks. Work with a physical therapist to make a safe exercise program, as recommended by your health care provider. Do exercises as told by your physical therapist. Managing pain, stiffness, and swelling  If directed, put ice on the affected knee. To do this: If you have a removable knee sleeve or brace, remove it as told by your health care provider. Put ice in a plastic bag. Place a towel between your skin and the bag. Leave the ice on for 20 minutes, 2-3 times a day. Remove the ice if your skin  turns bright red. This is very important. If you cannot feel pain, heat, or cold, you have a greater risk of damage to the area. If directed, use an elastic bandage to put pressure (compression) on your injured knee. This may control swelling, give support, and help with discomfort. Raise (elevate) your knee above the level of your heart while you are sitting or lying down. Sleep with a pillow under your knee. General instructions Take over-the-counter and prescription medicines only as told by your health care provider. Do not use any products that contain nicotine or tobacco, such as cigarettes, e-cigarettes, and chewing tobacco. If you need help quitting, ask your health care provider. If you are overweight, work with your health care provider and a dietitian to set a weight-loss goal that is healthy and reasonable for you. Extra weight can put pressure on your knee. Pay attention to any changes in your symptoms. Keep all follow-up visits. This is important. Contact a health care provider if: Your knee pain continues, changes, or gets worse. You have a fever along with knee pain. Your knee feels warm to the touch or is red. Your knee buckles or locks up. Get help right away if: Your knee swells, and the swelling becomes worse. You cannot move your knee. You have severe pain in your knee that cannot be managed with pain medicine. Summary Acute knee pain can be caused  by a fall, an injury, an infection, or damage, swelling, or irritation of the tissues that support your knee. Your health care provider may perform tests to find out the cause of the pain. Pay attention to any changes in your symptoms. Relieve your pain with rest, medicines, light activity, and the use of ice. Get help right away if your knee swells, you cannot move your knee, or you have severe pain that cannot be managed with medicine. This information is not intended to replace advice given to you by your health care  provider. Make sure you discuss any questions you have with your health care provider. Document Revised: 12/23/2019 Document Reviewed: 12/23/2019 Elsevier Patient Education  [place].

## 2022-02-05 NOTE — Progress Notes (Signed)
Subjective:   By signing my name below, I, Carylon Perches, attest that this documentation has been prepared under the direction and in the presence of [name] DO 02/05/2022   Patient ID: Justin [name], male    DOB: [birth year], 88 y.o.   MRN: [id number]  Chief Complaint  Patient presents with   ED visit Follow up    Left leg pain follow up. Pt states the 800 MG Motrin is helping some but it is still "tough"    HPI Patient is in today for an office visit. He is accompanied by his partner.   He reports that he has inflammation in his left knee. He went to the ED on 02/01/2022 for his symptoms. He states that pain radiates from his left thigh to the bottom of his left knee. He notes that symptoms became worse on 01/31/2022. He also states that pain is persistent. He was prescribed 800 Mg of Ibuprofen. He was discharged on the same day. As of today's visit, his pain is still present. He states that the first night while on Ibuprofen, he felt good. However, the next night the pain reappeared. He has used an ice pack and heat. He also took a hot shower and stated that pain had eased up. He believes that walking will help alleviate his symptoms. He has an appointment with Dr. Tamala Julian on 02/05/2022.   Past Medical History:  Diagnosis Date   Anemia    Basal cell cancer    Dr Wilhemina Bonito   Benign prostatic hypertrophy    Dr Amalia Hailey   Carpal tunnel syndrome 10/19/2015   Right   Chronic kidney disease    Colon polyp    Diverticulosis    Hyperlipidemia    Kidney stones    Shingles    Stroke [facility])    Transient ischemic attack    Dr Leonie Man    Past Surgical History:  Procedure Laterality Date   CATARACT EXTRACTION, BILATERAL     COLONOSCOPY W/ POLYPECTOMY  01/2009   adenomatous, Dr. Alben Spittle   HEMORRHOID SURGERY     LITHOTRIPSY      X 2;Dr. Amalia Hailey   PROSTATE BIOPSY     X2    Family History  Problem Relation Age of Onset   Heart failure Father    Heart attack Father 19    Diabetes Mother    Colon cancer Mother    Prostate cancer Brother    Stroke Brother    Coronary artery disease Brother        7 vessel CBAG   Diabetes Brother    Heart attack Other        2 P uncles , both > 80   Stroke Brother        in late 70s   Colon polyps Sister    Heart attack Brother    Other Sister        brain hemorrhage due to a fall    Social History   Socioeconomic History   Marital status: Married    Spouse name: Not on file   Number of children: 4   Years of education: Not on file   Highest education level: Not on file  Occupational History   Occupation: Truck Education administrator: [employer]  Tobacco Use   Smoking status: Former    Types: Cigarettes, Pipe, Cigars    Quit date: 07/23/1976    Years since quitting: 45.5   Smokeless tobacco:  Former    Types: Chew   Tobacco comments:    smoked [phone], up to < 1 ppd  Substance and Sexual Activity   Alcohol use: No    Alcohol/week: 1.0 standard drink of alcohol    Types: 1 Glasses of wine per week    Comment: rare wine   Drug use: No   Sexual activity: Not on file  Other Topics Concern   Not on file  Social History Narrative   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, 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71   Social Determinants of Radio broadcast assistant Strain: Low Risk  (03/21/2021)   Overall Financial Resource Strain (CARDIA)    Difficulty of Paying Living Expenses: Not hard at all  Food Insecurity: No Food Insecurity (03/21/2021)   Hunger Vital Sign    Worried About Running Out of Food in the Last Year: Never true    [place] in the Last Year: Never true  Transportation Needs: No Transportation Needs (03/21/2021)   PRAPARE - Hydrologist (Medical): No    Lack of Transportation (Non-Medical): No  Physical Activity: Inactive (03/21/2021)   Exercise Vital Sign    Days of Exercise per Week: 0 days    Minutes of  Exercise per Session: 0 min  Stress: No Stress Concern Present (03/21/2021)   [place]    Feeling of Stress : Not at all  Social Connections: Moderately Isolated (03/21/2021)   Social Connection and Isolation Panel [NHANES]    Frequency of Communication with Friends and Family: More than three times a week    Frequency of Social Gatherings with Friends and Family: More than three times a week    Attends Religious Services: Never    Marine scientist or Organizations: No    Attends Archivist Meetings: Never    Marital Status: Married  Human resources officer Violence: Not At Risk (03/21/2021)   Humiliation, Afraid, Rape, and Kick questionnaire    Fear of Current or Ex-Partner: No    Emotionally Abused: No    Physically Abused: No    Sexually Abused: No    Outpatient Medications Prior to Visit  Medication Sig Dispense Refill   aspirin 81 MG tablet Take 81 mg by mouth daily.     cyanocobalamin (,VITAMIN B-12,) 1000 MCG/ML injection Inject 1 mL (1,000 mcg total) into the muscle every 14 (fourteen) days. 6 mL 1   finasteride (PROSCAR) 5 MG tablet Take 1 tablet (5 mg total) by mouth daily. 90 tablet 1   fluticasone (FLONASE) 50 MCG/ACT nasal spray Place 2 sprays into both nostrils daily. 16 g 6   HYDROcodone-acetaminophen (NORCO/VICODIN) 5-325 MG tablet Take 1 tablet by mouth every 8 (eight) hours as needed. 15 tablet 0   ibuprofen (ADVIL) 800 MG tablet Take 1 tablet (800 mg total) by mouth every 8 (eight) hours for 7 days. 21 tablet 0   levocetirizine (XYZAL) 5 MG tablet Take 1 tablet (5 mg total) by mouth every evening. 30 tablet 5   simvastatin (ZOCOR) 20 MG tablet TAKE 1 TABLET BY MOUTH EVERY DAY 90 tablet 1   Syringe/Needle, Disp, (SYRINGE 3CC/25GX5/8") 25G X 5/8" 3 ML MISC Use with b12 every 2 weeks 10 each 1   No facility-administered medications prior to visit.    Allergies  Allergen Reactions    Prednisone Hives  Unknown REACTION: RASH   Inderal [Propranolol]     When taken for tremor it caused "streaks of pain " across forehead   Omeprazole Rash    Review of Systems  Constitutional:  Negative for fever and malaise/fatigue.  HENT:  Negative for congestion.   Eyes:  Negative for blurred vision.  Respiratory:  Negative for shortness of breath.   Cardiovascular:  Negative for chest pain, palpitations and leg swelling.  Gastrointestinal:  Negative for abdominal pain, blood in stool and nausea.  Genitourinary:  Negative for dysuria and frequency.  Musculoskeletal:  Positive for joint pain (Left Knee, Radiates from Bottom of Left Thigh to Bottom of Left Knee). Negative for falls.  Skin:  Negative for rash.  Neurological:  Negative for dizziness, loss of consciousness and headaches.  Endo/Heme/Allergies:  Negative for environmental allergies.  Psychiatric/Behavioral:  Negative for depression. The patient is not nervous/anxious.        Objective:    Physical Exam Vitals and nursing note reviewed.  Constitutional:      General: He is not in acute distress.    Appearance: Normal appearance. He is not ill-appearing.  HENT:     Head: Normocephalic and atraumatic.     Right Ear: External ear normal.     Left Ear: External ear normal.  Eyes:     Extraocular Movements: Extraocular movements intact.     Pupils: Pupils are equal, round, and reactive to light.  Cardiovascular:     Rate and Rhythm: Normal rate and regular rhythm.     Heart sounds: Normal heart sounds. No murmur heard.    No gallop.  Pulmonary:     Effort: Pulmonary effort is normal. No respiratory distress.     Breath sounds: Normal breath sounds. No wheezing or rales.  Skin:    General: Skin is warm and dry.  Neurological:     Mental Status: He is alert and oriented to person, place, and time.  Psychiatric:        Judgment: Judgment normal.     BP 132/70 (BP Location: Left Arm, Patient Position:  Sitting, Cuff Size: Normal)   Pulse 76   Temp 97.6 F (36.4 C) (Oral)   Resp 18   Ht '5\' 10"'$  (1.778 m)   Wt 184 lb 6.4 oz (83.6 kg)   SpO2 98%   BMI 26.46 kg/m  Wt Readings from Last 3 Encounters:  02/05/22 184 lb (83.5 kg)  02/05/22 184 lb 6.4 oz (83.6 kg)  01/18/22 182 lb 6.4 oz (82.7 kg)    Diabetic Foot Exam - Simple   No data filed    Lab Results  Component Value Date   WBC 7.2 01/18/2022   HGB 10.7 (L) 01/18/2022   HCT 31.4 (L) 01/18/2022   PLT 197.0 01/18/2022   GLUCOSE 119 (H) 01/18/2022   CHOL 103 01/18/2022   TRIG 80.0 01/18/2022   HDL 44.90 01/18/2022   LDLDIRECT 49.0 12/05/2020   LDLCALC 42 01/18/2022   ALT 10 01/18/2022   AST 16 01/18/2022   NA 139 01/18/2022   K 3.4 (L) 01/18/2022   CL 105 01/18/2022   CREATININE 0.90 01/18/2022   BUN 27 (H) 01/18/2022   CO2 27 01/18/2022   TSH 4.76 01/18/2022   PSA 2.03 01/18/2022   HGBA1C 5.4 01/10/2016   MICROALBUR 1.3 01/10/2016    Lab Results  Component Value Date   TSH 4.76 01/18/2022   Lab Results  Component Value Date   WBC 7.2 01/18/2022   HGB  10.7 (L) 01/18/2022   HCT 31.4 (L) 01/18/2022   MCV 103.5 (H) 01/18/2022   PLT 197.0 01/18/2022   Lab Results  Component Value Date   NA 139 01/18/2022   K 3.4 (L) 01/18/2022   CO2 27 01/18/2022   GLUCOSE 119 (H) 01/18/2022   BUN 27 (H) 01/18/2022   CREATININE 0.90 01/18/2022   BILITOT 0.6 01/18/2022   ALKPHOS 72 01/18/2022   AST 16 01/18/2022   ALT 10 01/18/2022   PROT 8.2 01/18/2022   ALBUMIN 3.7 01/18/2022   CALCIUM 8.6 01/18/2022   ANIONGAP 6 11/23/2020   GFR 73.06 01/18/2022   Lab Results  Component Value Date   CHOL 103 01/18/2022   Lab Results  Component Value Date   HDL 44.90 01/18/2022   Lab Results  Component Value Date   LDLCALC 42 01/18/2022   Lab Results  Component Value Date   TRIG 80.0 01/18/2022   Lab Results  Component Value Date   CHOLHDL 2 01/18/2022   Lab Results  Component Value Date   HGBA1C 5.4  01/10/2016       Assessment & Plan:   Problem List Items Addressed This Visit   None Visit Diagnoses     Acute pain of left knee    -  Primary   Relevant Medications   traMADol (ULTRAM) 50 MG tablet   Other Relevant Orders   Ambulatory referral to Sports Medicine      Pain is severe----  ib not effective Tramadol sent in Pt to see sport med today Meds ordered this encounter  Medications   traMADol (ULTRAM) 50 MG tablet    Sig: Take 1 tablet (50 mg total) by mouth every 6 (six) hours as needed.    Dispense:  20 tablet    Refill:  0    I, [name], DO, personally preformed the services described in this documentation.  All medical record entries made by the scribe were at my direction and in my presence.  I have reviewed the chart and discharge instructions (if applicable) and agree that the record reflects my personal performance and is accurate and complete. 02/05/2022   I,Amber Collins,acting as a scribe for [employer], DO.,have documented all relevant documentation on the behalf of [name], DO,as directed by  [name], DO while in the presence of [name], DO.   [name], DO

## 2022-02-05 NOTE — Progress Notes (Signed)
[place] Phone: [phone] Subjective:   Justin Justin [name], am serving as a scribe for Justin Justin [name].   I'm seeing this patient by the request  of:  Justin Held, DO  CC: Left knee pain  WVP:[id number]  Justin Justin [name] is a 88 y.o. male coming in with complaint of L knee pain. Patient states that last Thursday night his pain increased enough that he got worried he had blood clot. Pain worse at night. Did mow the lawn and states that there is a bank that causes him to walk with more pressure on L leg and believes that this might have caused his pain.  Patient did have a Doppler done that was negative for any type of clot  02/01/2022 Xray L knee FINDINGS: Mild chronic enthesopathic change at the quadriceps insertion on the patella. Minimal medial compartment joint space narrowing. Justin [name] acute fracture or dislocation.   IMPRESSION: Minimal medial compartment osteoarthritis.  Past Medical History:  Diagnosis Date   Anemia    Basal cell cancer    Dr Justin Justin [name]   Benign prostatic hypertrophy    Dr Justin Justin [name]   Carpal tunnel syndrome 10/19/2015   Right   Chronic kidney disease    Colon polyp    Diverticulosis    Hyperlipidemia    Kidney stones    Shingles    Stroke [facility])    Transient ischemic attack    Dr Leonie [name]   Past Surgical History:  Procedure Laterality Date   CATARACT EXTRACTION, BILATERAL     COLONOSCOPY W/ POLYPECTOMY  01/2009   adenomatous, Dr. Alben [name]   HEMORRHOID SURGERY     LITHOTRIPSY      X 2;Dr. Amalia [name]   PROSTATE BIOPSY     X2   Social History   Socioeconomic History   Marital status: Married    Spouse name: Not on file   Number of children: 4   Years of education: Not on file   Highest education level: Not on file  Occupational History   Occupation: Truck Education administrator: [employer]  Tobacco Use   Smoking status: Former    Types: Cigarettes, Pipe, Cigars     Quit date: 07/23/1976    Years since quitting: 45.5   Smokeless tobacco: Former    Types: Chew   Tobacco comments:    smoked [phone], up to < 1 ppd  Substance and Sexual Activity   Alcohol use: Justin [name]    Alcohol/week: 1.0 standard drink of alcohol    Types: 1 Glasses of wine per week    Comment: rare wine   Drug use: Justin [name]   Sexual activity: Not on file  Other Topics Concern   Not on file  Social History Narrative   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, 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71   Social Determinants of Radio broadcast assistant Strain: Low Risk  (03/21/2021)   Overall Financial Resource Strain (CARDIA)    Difficulty of Paying Living Expenses: Not hard at all  Food Insecurity: Justin [name] Food Insecurity (03/21/2021)   Hunger Vital Sign    Worried About Running Out of Food in the Last Year: Never true    [place] in the Last Year: Never true  Transportation Needs: Justin [name] Transportation Needs (03/21/2021)   PRAPARE - Transportation    Lack of Transportation (  Medical): Justin [name]    Lack of Transportation (Non-Medical): Justin [name]  Physical Activity: Inactive (03/21/2021)   Exercise Vital Sign    Days of Exercise per Week: 0 days    Minutes of Exercise per Session: 0 min  Stress: Justin [name] Stress Concern Present (03/21/2021)   [place]    Feeling of Stress : Not at all  Social Connections: Moderately Isolated (03/21/2021)   Social Connection and Isolation Panel [NHANES]    Frequency of Communication with Friends and Family: More than three times a week    Frequency of Social Gatherings with Friends and Family: More than three times a week    Attends Religious Services: Never    Marine scientist or Organizations: Justin [name]    Attends Music therapist: Never    Marital Status: Married   Allergies  Allergen Reactions   Prednisone Hives    Unknown REACTION: RASH   Inderal  [Propranolol]     When taken for tremor it caused "streaks of pain " across forehead   Omeprazole Rash   Family History  Problem Relation Age of Onset   Heart failure Father    Heart attack Father 12   Diabetes Mother    Colon cancer Mother    Prostate cancer Brother    Stroke Brother    Coronary artery disease Brother        7 vessel CBAG   Diabetes Brother    Heart attack Other        2 P uncles , both > 87   Stroke Brother        in late 70s   Colon polyps Sister    Heart attack Brother    Other Sister        brain hemorrhage due to a fall     Current Outpatient Medications (Cardiovascular):    simvastatin (ZOCOR) 20 MG tablet, TAKE 1 TABLET BY MOUTH EVERY DAY  Current Outpatient Medications (Respiratory):    fluticasone (FLONASE) 50 MCG/ACT nasal spray, Place 2 sprays into both nostrils daily.   levocetirizine (XYZAL) 5 MG tablet, Take 1 tablet (5 mg total) by mouth every evening.  Current Outpatient Medications (Analgesics):    aspirin 81 MG tablet, Take 81 mg by mouth daily.   HYDROcodone-acetaminophen (NORCO/VICODIN) 5-325 MG tablet, Take 1 tablet by mouth every 8 (eight) hours as needed.   ibuprofen (ADVIL) 800 MG tablet, Take 1 tablet (800 mg total) by mouth every 8 (eight) hours for 7 days.   traMADol (ULTRAM) 50 MG tablet, Take 1 tablet (50 mg total) by mouth every 6 (six) hours as needed.  Current Outpatient Medications (Hematological):    cyanocobalamin (,VITAMIN B-12,) 1000 MCG/ML injection, Inject 1 mL (1,000 mcg total) into the muscle every 14 (fourteen) days.  Current Outpatient Medications (Other):    finasteride (PROSCAR) 5 MG tablet, Take 1 tablet (5 mg total) by mouth daily.   Syringe/Needle, Disp, (SYRINGE 3CC/25GX5/8") 25G X 5/8" 3 ML MISC, Use with b12 every 2 weeks   Reviewed prior external information including notes and imaging from  primary care provider As well as notes that were available from care everywhere and other healthcare  systems.  Past medical history, social, surgical and family history all reviewed in electronic medical record.  Justin [name] pertanent information unless stated regarding to the chief complaint.   Review of Systems:  Justin [name] headache, visual changes, nausea, vomiting, diarrhea, constipation, dizziness, abdominal pain, skin rash, fevers, chills, night  sweats, weight loss, swollen lymph nodes, body aches, joint swelling, chest pain, shortness of breath, mood changes. POSITIVE muscle aches  Objective  Blood pressure 120/70, pulse 72, height '5\' 10"'$  (1.778 m), weight 184 lb (83.5 kg), SpO2 98 %.   General: Justin [name] apparent distress alert and oriented x3 mood and affect normal, dressed appropriately.  HEENT: Pupils equal, extraocular movements intact  Respiratory: Patient's speak in full sentences and does not appear short of breath  Cardiovascular: Trace lower extremity edema, non tender, Justin [name] erythema  Left knee exam does have some mild lateral tracking of the patella noted.  Patient does have some tenderness to palpation over the medial joint space.   Limited muscular skeletal ultrasound was performed and interpreted by Hulan [name], M  Limited ultrasound shows some mild hypoechoic changes of the patellofemoral joint noted.  Some mild narrowing of the patellofemoral joint and the medial joint space.  Meniscus appear to be fairly unremarkable though. Impression: Mild patellofemoral arthritis with a small effusion.  [id number]; 15 additional minutes spent for Therapeutic exercises as stated in above notes.  This included exercises focusing on stretching, strengthening, with significant focus on eccentric aspects.   Long term goals include an improvement in range of motion, strength, endurance as well as avoiding reinjury. Patient's frequency would include in 1-2 times a day, 3-5 times a week for a duration of 6-12 weeks.  Given rehab exercises handout for VMO, hip abductors, core, entire kinetic chain including  proprioception exercises.  Could benefit from PT, regular exercise, upright biking,  Proper technique shown and discussed handout in great detail with ATC.  All questions were discussed and answered.     Impression and Recommendations:    The above documentation has been reviewed and is accurate and complete Lyndal Pulley, DO

## 2022-03-06 DIAGNOSIS — N401 Enlarged prostate with lower urinary tract symptoms: Secondary | ICD-10-CM | POA: Diagnosis not present

## 2022-03-06 DIAGNOSIS — Z8042 Family history of malignant neoplasm of prostate: Secondary | ICD-10-CM | POA: Diagnosis not present

## 2022-03-06 DIAGNOSIS — N529 Male erectile dysfunction, unspecified: Secondary | ICD-10-CM | POA: Diagnosis not present

## 2022-03-06 DIAGNOSIS — Z87442 Personal history of urinary calculi: Secondary | ICD-10-CM | POA: Diagnosis not present

## 2022-03-07 DIAGNOSIS — H353122 Nonexudative age-related macular degeneration, left eye, intermediate dry stage: Secondary | ICD-10-CM | POA: Diagnosis not present

## 2022-03-09 ENCOUNTER — Ambulatory Visit (INDEPENDENT_AMBULATORY_CARE_PROVIDER_SITE_OTHER): Payer: PPO | Admitting: *Deleted

## 2022-03-09 DIAGNOSIS — E538 Deficiency of other specified B group vitamins: Secondary | ICD-10-CM

## 2022-03-09 MED ORDER — CYANOCOBALAMIN 1000 MCG/ML IJ SOLN
1000.0000 ug | Freq: Once | INTRAMUSCULAR | Status: AC
  Administered 2022-03-09: 1000 ug via INTRAMUSCULAR

## 2022-03-09 NOTE — Progress Notes (Signed)
Justin [name] is a 88 y.o. male  presents to the office today for a B12 injection, per physician's orders. Original order: per Carollee Herter, Alferd Apa, DO  cyanocobalamin, 1000 mg/ml IM was administered in left deltoid today.   Injection tolerated well.  Next injection: 04/11/22

## 2022-03-19 ENCOUNTER — Ambulatory Visit: Payer: PPO | Admitting: Family Medicine

## 2022-04-03 ENCOUNTER — Ambulatory Visit: Payer: PPO | Admitting: Family Medicine

## 2022-04-11 ENCOUNTER — Ambulatory Visit (INDEPENDENT_AMBULATORY_CARE_PROVIDER_SITE_OTHER): Payer: PPO

## 2022-04-11 DIAGNOSIS — E538 Deficiency of other specified B group vitamins: Secondary | ICD-10-CM | POA: Diagnosis not present

## 2022-04-11 MED ORDER — CYANOCOBALAMIN 1000 MCG/ML IJ SOLN
1000.0000 ug | Freq: Once | INTRAMUSCULAR | Status: AC
  Administered 2022-04-11: 1000 ug via INTRAMUSCULAR

## 2022-04-11 NOTE — Progress Notes (Signed)
Justin [name] is a 88 y.o. male presents to the office today for a B12 injection, per physician's orders. Original order: per Carollee Herter, Alferd Apa, DO  cyanocobalamin, 1000 mg/ml IM was administered in left deltoid today.   Injection tolerated well.

## 2022-04-18 DIAGNOSIS — L821 Other seborrheic keratosis: Secondary | ICD-10-CM | POA: Diagnosis not present

## 2022-04-18 DIAGNOSIS — L570 Actinic keratosis: Secondary | ICD-10-CM | POA: Diagnosis not present

## 2022-05-11 ENCOUNTER — Ambulatory Visit (INDEPENDENT_AMBULATORY_CARE_PROVIDER_SITE_OTHER): Payer: PPO

## 2022-05-11 DIAGNOSIS — E538 Deficiency of other specified B group vitamins: Secondary | ICD-10-CM | POA: Diagnosis not present

## 2022-05-11 MED ORDER — CYANOCOBALAMIN 1000 MCG/ML IJ SOLN
1000.0000 ug | Freq: Once | INTRAMUSCULAR | Status: AC
  Administered 2022-05-11: 1000 ug via INTRAMUSCULAR

## 2022-05-11 NOTE — Progress Notes (Addendum)
Justin [name] is a 88 y.o. male  presents to the office today for a B12 injection, per physician's orders. Original order: per Carollee Herter, Alferd Apa, DO   cyanocobalamin, 1000 mg/ml IM was administered in left deltoid today.    Injection tolerated well.

## 2022-06-13 ENCOUNTER — Ambulatory Visit (INDEPENDENT_AMBULATORY_CARE_PROVIDER_SITE_OTHER): Payer: PPO

## 2022-06-13 DIAGNOSIS — E538 Deficiency of other specified B group vitamins: Secondary | ICD-10-CM

## 2022-06-13 DIAGNOSIS — Z23 Encounter for immunization: Secondary | ICD-10-CM | POA: Diagnosis not present

## 2022-06-13 MED ORDER — CYANOCOBALAMIN 1000 MCG/ML IJ SOLN
1000.0000 ug | Freq: Once | INTRAMUSCULAR | Status: AC
  Administered 2022-06-13: 1000 ug via INTRAMUSCULAR

## 2022-06-13 NOTE — Progress Notes (Signed)
Justin [name] is a 88 y.o. male presents to the office today for a B12 injection and High doze Flu shot, per physician's orders. Original order: per Carollee Herter, Alferd Apa, DO   cyanocobalamin, 1000 mg/ml IM was administered in left deltoid today.  Injection tolerated well.

## 2022-06-22 ENCOUNTER — Encounter: Payer: Self-pay | Admitting: Family Medicine

## 2022-07-13 ENCOUNTER — Ambulatory Visit: Payer: PPO

## 2022-07-17 ENCOUNTER — Telehealth: Payer: Self-pay | Admitting: *Deleted

## 2022-07-17 NOTE — Telephone Encounter (Signed)
Who Is Calling Patient / Member / Family / Caregiver Caller Name Shriyan Arakawa Caller Phone Number [phone] Patient Name Justin [name] Patient DOB [birth year] Call Type Message Only Information Provided Reason for Call Request to Reschedule Office Appointment Initial Comment Caller states that she needs to reschedule her husband's appointment. Declined triage. Disp. Time Disposition Final User 07/13/2022 1:07:46 PM General Information Provided Yes Windy Canny

## 2022-07-18 ENCOUNTER — Ambulatory Visit (INDEPENDENT_AMBULATORY_CARE_PROVIDER_SITE_OTHER): Payer: PPO

## 2022-07-18 DIAGNOSIS — E538 Deficiency of other specified B group vitamins: Secondary | ICD-10-CM

## 2022-07-18 MED ORDER — CYANOCOBALAMIN 1000 MCG/ML IJ SOLN
1000.0000 ug | Freq: Once | INTRAMUSCULAR | Status: AC
  Administered 2022-07-18: 1000 ug via INTRAMUSCULAR

## 2022-07-18 NOTE — Progress Notes (Signed)
Pt here for monthly B12 injection per Dr. Carollee Herter  B12 1052mg given IM left deltoid, and pt tolerated injection well.  Next B12 injection scheduled for next month.

## 2022-07-26 ENCOUNTER — Other Ambulatory Visit: Payer: Self-pay | Admitting: Family Medicine

## 2022-07-26 DIAGNOSIS — E785 Hyperlipidemia, unspecified: Secondary | ICD-10-CM

## 2022-08-01 DIAGNOSIS — H524 Presbyopia: Secondary | ICD-10-CM | POA: Diagnosis not present

## 2022-08-01 DIAGNOSIS — H353133 Nonexudative age-related macular degeneration, bilateral, advanced atrophic without subfoveal involvement: Secondary | ICD-10-CM | POA: Diagnosis not present

## 2022-08-01 DIAGNOSIS — H52223 Regular astigmatism, bilateral: Secondary | ICD-10-CM | POA: Diagnosis not present

## 2022-08-01 DIAGNOSIS — H04123 Dry eye syndrome of bilateral lacrimal glands: Secondary | ICD-10-CM | POA: Diagnosis not present

## 2022-08-01 DIAGNOSIS — Z961 Presence of intraocular lens: Secondary | ICD-10-CM | POA: Diagnosis not present

## 2022-08-21 ENCOUNTER — Ambulatory Visit (INDEPENDENT_AMBULATORY_CARE_PROVIDER_SITE_OTHER): Payer: PPO | Admitting: *Deleted

## 2022-08-21 DIAGNOSIS — E538 Deficiency of other specified B group vitamins: Secondary | ICD-10-CM

## 2022-08-21 MED ORDER — CYANOCOBALAMIN 1000 MCG/ML IJ SOLN
1000.0000 ug | Freq: Once | INTRAMUSCULAR | Status: AC
  Administered 2022-08-21: 1000 ug via INTRAMUSCULAR

## 2022-08-21 NOTE — Progress Notes (Signed)
Pt here for monthly B12 injection per Dr. Carollee Herter.   B12 1062mg given IM right deltoid, and pt tolerated injection well.   Next B12 injection scheduled for 09/21/22 during AWV.

## 2022-08-31 DIAGNOSIS — H43813 Vitreous degeneration, bilateral: Secondary | ICD-10-CM | POA: Diagnosis not present

## 2022-08-31 DIAGNOSIS — H353133 Nonexudative age-related macular degeneration, bilateral, advanced atrophic without subfoveal involvement: Secondary | ICD-10-CM | POA: Diagnosis not present

## 2022-09-12 ENCOUNTER — Telehealth: Payer: Self-pay | Admitting: Family Medicine

## 2022-09-12 NOTE — Telephone Encounter (Signed)
Contacted Justin [name] to schedule their annual wellness visit. Patient declined to schedule AWV at this time.  Justin [name]; Care Guide Ambulatory Clinical [facility] Direct Dial: [phone]

## 2022-09-21 ENCOUNTER — Ambulatory Visit (INDEPENDENT_AMBULATORY_CARE_PROVIDER_SITE_OTHER): Payer: PPO | Admitting: *Deleted

## 2022-09-21 DIAGNOSIS — E538 Deficiency of other specified B group vitamins: Secondary | ICD-10-CM | POA: Diagnosis not present

## 2022-09-21 MED ORDER — CYANOCOBALAMIN 1000 MCG/ML IJ SOLN
1000.0000 ug | Freq: Once | INTRAMUSCULAR | Status: AC
  Administered 2022-09-21: 1000 ug via INTRAMUSCULAR

## 2022-09-21 NOTE — Progress Notes (Signed)
Pt here for monthly B12 injection per Dr. Carollee Herter.   B12 1037mg given IM left deltoid, and pt tolerated injection well.   Next B12 injection scheduled for October 23, 2022.

## 2022-10-08 ENCOUNTER — Telehealth: Payer: Self-pay | Admitting: Family Medicine

## 2022-10-08 NOTE — Telephone Encounter (Signed)
Contacted [name] to schedule their annual wellness visit. Patient declined to schedule AWV at this time. He wanted on same day as his B12 appt no avail appts.  Stated he will call back,  Sherol Dade; [place]: [phone]

## 2022-10-09 NOTE — Telephone Encounter (Signed)
Pt called back to schedule but wants to do in person and on a day his wife needed to be here but no open appts. Pt will call back to schedule but seems like he may not be interested in it.

## 2022-10-17 DIAGNOSIS — L578 Other skin changes due to chronic exposure to nonionizing radiation: Secondary | ICD-10-CM | POA: Diagnosis not present

## 2022-10-17 DIAGNOSIS — Z85828 Personal history of other malignant neoplasm of skin: Secondary | ICD-10-CM | POA: Diagnosis not present

## 2022-10-17 DIAGNOSIS — Z08 Encounter for follow-up examination after completed treatment for malignant neoplasm: Secondary | ICD-10-CM | POA: Diagnosis not present

## 2022-10-17 DIAGNOSIS — L570 Actinic keratosis: Secondary | ICD-10-CM | POA: Diagnosis not present

## 2022-10-17 DIAGNOSIS — D225 Melanocytic nevi of trunk: Secondary | ICD-10-CM | POA: Diagnosis not present

## 2022-10-17 DIAGNOSIS — D2262 Melanocytic nevi of left upper limb, including shoulder: Secondary | ICD-10-CM | POA: Diagnosis not present

## 2022-10-17 DIAGNOSIS — D2261 Melanocytic nevi of right upper limb, including shoulder: Secondary | ICD-10-CM | POA: Diagnosis not present

## 2022-10-17 DIAGNOSIS — L728 Other follicular cysts of the skin and subcutaneous tissue: Secondary | ICD-10-CM | POA: Diagnosis not present

## 2022-10-17 DIAGNOSIS — L821 Other seborrheic keratosis: Secondary | ICD-10-CM | POA: Diagnosis not present

## 2022-10-17 DIAGNOSIS — R202 Paresthesia of skin: Secondary | ICD-10-CM | POA: Diagnosis not present

## 2022-10-17 DIAGNOSIS — H61032 Chondritis of left external ear: Secondary | ICD-10-CM | POA: Diagnosis not present

## 2022-10-23 ENCOUNTER — Ambulatory Visit (INDEPENDENT_AMBULATORY_CARE_PROVIDER_SITE_OTHER): Payer: PPO

## 2022-10-23 DIAGNOSIS — E538 Deficiency of other specified B group vitamins: Secondary | ICD-10-CM | POA: Diagnosis not present

## 2022-10-23 MED ORDER — CYANOCOBALAMIN 1000 MCG/ML IJ SOLN
1000.0000 ug | Freq: Once | INTRAMUSCULAR | Status: AC
  Administered 2022-10-23: 1000 ug via INTRAMUSCULAR

## 2022-10-23 NOTE — Progress Notes (Signed)
Justin [name] is a 89 y.o. male presents to the office today for B12  injections, per physician's orders. Original order: Per Dr. Carollee Herter Cyanocobalamin (med), 1000 mg/ml (dose),  IM (route) was administered left deltoid (location) today. Patient tolerated injection.  Patient next injection due: in one month, appt made Yes  [name]

## 2022-10-31 ENCOUNTER — Telehealth: Payer: Self-pay | Admitting: Family Medicine

## 2022-10-31 NOTE — Telephone Encounter (Signed)
Contacted Justin [name] to schedule their annual wellness visit. Patient declined to schedule AWV at this time.  [name]; Care Guide Ambulatory Clinical Support [facility] Direct Dial: [phone]  

## 2022-11-05 ENCOUNTER — Other Ambulatory Visit: Payer: Self-pay | Admitting: Family Medicine

## 2022-11-05 DIAGNOSIS — R413 Other amnesia: Secondary | ICD-10-CM

## 2022-11-07 ENCOUNTER — Encounter: Payer: Self-pay | Admitting: *Deleted

## 2022-11-23 ENCOUNTER — Ambulatory Visit: Payer: PPO

## 2022-12-11 ENCOUNTER — Encounter: Payer: Self-pay | Admitting: Family Medicine

## 2022-12-11 ENCOUNTER — Ambulatory Visit (INDEPENDENT_AMBULATORY_CARE_PROVIDER_SITE_OTHER): Payer: PPO | Admitting: Family Medicine

## 2022-12-11 ENCOUNTER — Ambulatory Visit: Payer: PPO

## 2022-12-11 VITALS — BP 140/70 | HR 62 | Temp 98.0°F | Resp 18 | Ht 70.0 in | Wt 190.0 lb

## 2022-12-11 DIAGNOSIS — R413 Other amnesia: Secondary | ICD-10-CM

## 2022-12-11 DIAGNOSIS — E538 Deficiency of other specified B group vitamins: Secondary | ICD-10-CM | POA: Diagnosis not present

## 2022-12-11 DIAGNOSIS — E039 Hypothyroidism, unspecified: Secondary | ICD-10-CM | POA: Diagnosis not present

## 2022-12-11 DIAGNOSIS — D649 Anemia, unspecified: Secondary | ICD-10-CM | POA: Diagnosis not present

## 2022-12-11 DIAGNOSIS — E785 Hyperlipidemia, unspecified: Secondary | ICD-10-CM | POA: Diagnosis not present

## 2022-12-11 DIAGNOSIS — R351 Nocturia: Secondary | ICD-10-CM | POA: Diagnosis not present

## 2022-12-11 MED ORDER — CYANOCOBALAMIN 1000 MCG/ML IJ SOLN
1000.0000 ug | Freq: Once | INTRAMUSCULAR | Status: AC
Start: 2022-12-11 — End: 2022-12-11
  Administered 2022-12-11: 1000 ug via INTRAMUSCULAR

## 2022-12-11 NOTE — Assessment & Plan Note (Signed)
Pt agreed to be referred to neuro Mmse 25/30 and sundowning and aggressive towards his wife Mri brain and check labs  Wife is grateful for referral

## 2022-12-11 NOTE — Progress Notes (Signed)
Established Patient Office Visit  Subjective   Patient ID: Justin [name], male    DOB: 1925-03-24  Age: 89 y.o. MRN: [id number]  Chief Complaint  Patient presents with   Hyperlipidemia   Follow-up    HPI Pt is here with his wife for f/u ----  his wife had c/o him having memory problems and he gets angry and is mean to her.  Pt wife says he is sun downing .    Patient Active Problem List   Diagnosis Date Noted   Memory loss 12/11/2022   Nocturia 12/11/2022   Degenerative arthritis of left knee 02/05/2022   Bronchitis 11/17/2021   Lumbar radiculopathy 08/02/2021   Dysuria 04/21/2020   Left hip pain 04/21/2020   Acute pain of right knee 10/01/2019   Acute midline low back pain without sciatica 05/26/2019   Dizziness 04/26/2017   Dehydration 04/26/2017   Allergic reaction caused by a drug 04/03/2017   Preventative health care 10/27/2016   Left flank pain 04/23/2016   Hypothyroidism 10/25/2015   Carpal tunnel syndrome 10/19/2015   Pain, joint, hand, right 08/22/2015   B12 deficiency 12/31/2011   Benign prostatic hyperplasia with urinary obstruction 09/25/2011   SKIN CANCER, HX OF 03/31/2010   DIVERTICULOSIS, COLON 05/10/2008   Hx of TIA (transient ischemic attack) and stroke 05/10/2008   History of colonic polyps 05/10/2008   NEPHROLITHIASIS, HX OF 05/10/2008   Hyperlipidemia 09/24/2007   Anemia 12/25/2006   Past Medical History:  Diagnosis Date   Anemia    Basal cell cancer    Dr Karlyn Agee   Benign prostatic hypertrophy    Dr Logan Bores   Carpal tunnel syndrome 10/19/2015   Right   Chronic kidney disease    Colon polyp    Diverticulosis    Hyperlipidemia    Kidney stones    Shingles    Stroke [facility])    Transient ischemic attack    Dr Pearlean Brownie   Past Surgical History:  Procedure Laterality Date   CATARACT EXTRACTION, BILATERAL     COLONOSCOPY W/ POLYPECTOMY  01/2009   adenomatous, Dr. Barnet Pall   HEMORRHOID SURGERY     LITHOTRIPSY      X 2;Dr. Logan Bores    PROSTATE BIOPSY     X2   Social History   Tobacco Use   Smoking status: Former    Types: Cigarettes, Pipe, Cigars    Quit date: 07/23/1976    Years since quitting: 46.4   Smokeless tobacco: Former    Types: Chew   Tobacco comments:    smoked [phone], up to < 1 ppd  Substance Use Topics   Alcohol use: No    Alcohol/week: 1.0 standard drink of alcohol    Types: 1 Glasses of wine per week    Comment: rare wine   Drug use: No   Social History   Socioeconomic History   Marital status: Married    Spouse name: Not on file   Number of children: 4   Years of education: Not on file   Highest education level: Not on file  Occupational History   Occupation: Truck Air traffic controller: [employer]  Tobacco Use   Smoking status: Former    Types: Cigarettes, Pipe, Cigars    Quit date: 07/23/1976    Years since quitting: 46.4   Smokeless tobacco: Former    Types: Chew   Tobacco comments:    smoked [phone], up to < 1 ppd  Substance and Sexual  Activity   Alcohol use: No    Alcohol/week: 1.0 standard drink of alcohol    Types: 1 Glasses of wine per week    Comment: rare wine   Drug use: No   Sexual activity: Not on file  Other Topics Concern   Not on file  Social History Narrative   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, 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74   Social Determinants of Corporate investment banker Strain: Low Risk  (03/21/2021)   Overall Financial Resource Strain (CARDIA)    Difficulty of Paying Living Expenses: Not hard at all  Food Insecurity: No Food Insecurity (03/21/2021)   Hunger Vital Sign    Worried About Running Out of Food in the Last Year: Never true    Ran Out of Food in the Last Year: Never true  Transportation Needs: No Transportation Needs (03/21/2021)   PRAPARE - Administrator, Civil Service (Medical): No    Lack of Transportation (Non-Medical): No  Physical Activity: Inactive (03/21/2021)    Exercise Vital Sign    Days of Exercise per Week: 0 days    Minutes of Exercise per Session: 0 min  Stress: No Stress Concern Present (03/21/2021)   [employer] of Occupational Health - Occupational Stress Questionnaire    Feeling of Stress : Not at all  Social Connections: Moderately Isolated (03/21/2021)   Social Connection and Isolation Panel [NHANES]    Frequency of Communication with Friends and Family: More than three times a week    Frequency of Social Gatherings with Friends and Family: More than three times a week    Attends Religious Services: Never    Database administrator or Organizations: No    Attends Banker Meetings: Never    Marital Status: Married  Catering manager Violence: Not At Risk (03/21/2021)   Humiliation, Afraid, Rape, and Kick questionnaire    Fear of Current or Ex-Partner: No    Emotionally Abused: No    Physically Abused: No    Sexually Abused: No   Family Status  Relation Name Status   Father  Deceased   Mother  Deceased   Brother  Deceased   Brother  (Not Specified)   Brother  (Not Specified)   Other  (Not Specified)   Brother  (Not Specified)   Sister  (Not Specified)   Brother  (Not Specified)   Sister  Deceased   Family History  Problem Relation Age of Onset   Heart failure Father    Heart attack Father 69   Diabetes Mother    Colon cancer Mother    Prostate cancer Brother    Stroke Brother    Coronary artery disease Brother        7 vessel CBAG   Diabetes Brother    Heart attack Other        2 P uncles , both > 55   Stroke Brother        in late 70s   Colon polyps Sister    Heart attack Brother    Other Sister        brain hemorrhage due to a fall   Allergies  Allergen Reactions   Prednisone Hives    Unknown REACTION: RASH   Inderal [Propranolol]     When taken for tremor it caused "streaks of pain " across forehead   Omeprazole Rash      Review  of Systems  Constitutional:  Negative for fever and  malaise/fatigue.  HENT:  Negative for congestion.   Eyes:  Negative for blurred vision.  Respiratory:  Negative for shortness of breath.   Cardiovascular:  Negative for chest pain, palpitations and leg swelling.  Gastrointestinal:  Negative for abdominal pain, blood in stool and nausea.  Genitourinary:  Negative for dysuria and frequency.  Musculoskeletal:  Negative for falls.  Skin:  Negative for rash.  Neurological:  Negative for dizziness, loss of consciousness and headaches.  Endo/Heme/Allergies:  Negative for environmental allergies.  Psychiatric/Behavioral:  Positive for memory loss. Negative for depression. The patient is not nervous/anxious.       Objective:     BP (!) 140/70 (BP Location: Left Arm, Patient Position: Sitting, Cuff Size: Normal)   Pulse 62   Temp 98 F (36.7 C) (Oral)   Resp 18   Ht 5\' 10"  (1.778 m)   Wt 190 lb (86.2 kg)   SpO2 98%   BMI 27.26 kg/m  BP Readings from Last 3 Encounters:  12/11/22 (!) 140/70  02/05/22 120/70  02/05/22 132/70   Wt Readings from Last 3 Encounters:  12/11/22 190 lb (86.2 kg)  02/05/22 184 lb (83.5 kg)  02/05/22 184 lb 6.4 oz (83.6 kg)   SpO2 Readings from Last 3 Encounters:  12/11/22 98%  02/05/22 98%  02/05/22 98%      Physical Exam Vitals and nursing note reviewed.  Constitutional:      Appearance: He is well-developed.  HENT:     Head: Normocephalic and atraumatic.  Eyes:     Pupils: Pupils are equal, round, and reactive to light.  Neck:     Thyroid: No thyromegaly.  Cardiovascular:     Rate and Rhythm: Normal rate and regular rhythm.     Heart sounds: No murmur heard. Pulmonary:     Effort: Pulmonary effort is normal. No respiratory distress.     Breath sounds: Normal breath sounds. No wheezing or rales.  Chest:     Chest wall: No tenderness.  Musculoskeletal:     Cervical back: Normal range of motion and neck supple.     Right hip: Tenderness present. Normal range of motion. Normal strength.      Left hip: Tenderness present. Normal range of motion. Normal strength.     Right foot: Bony tenderness present. No swelling.     Left foot: Bony tenderness present. No swelling.  Skin:    General: Skin is warm and dry.  Neurological:     Mental Status: He is alert and oriented to person, place, and time.     Gait: Gait abnormal.     Comments: Mmse--- 25/30  Psychiatric:        Behavior: Behavior normal.        Thought Content: Thought content normal.        Judgment: Judgment normal.     No results found for any visits on 12/11/22.  Last CBC Lab Results  Component Value Date   WBC 7.2 01/18/2022   HGB 10.7 (L) 01/18/2022   HCT 31.4 (L) 01/18/2022   MCV 103.5 (H) 01/18/2022   MCH 35.6 (H) 11/23/2020   RDW 13.8 01/18/2022   PLT 197.0 01/18/2022   Last metabolic panel Lab Results  Component Value Date   GLUCOSE 119 (H) 01/18/2022   NA 139 01/18/2022   K 3.4 (L) 01/18/2022   CL 105 01/18/2022   CO2 27 01/18/2022   BUN 27 (H) 01/18/2022  CREATININE 0.90 01/18/2022   GFRNONAA >60 11/23/2020   CALCIUM 8.6 01/18/2022   PROT 8.2 01/18/2022   ALBUMIN 3.7 01/18/2022   BILITOT 0.6 01/18/2022   ALKPHOS 72 01/18/2022   AST 16 01/18/2022   ALT 10 01/18/2022   ANIONGAP 6 11/23/2020   Last lipids Lab Results  Component Value Date   CHOL 103 01/18/2022   HDL 44.90 01/18/2022   LDLCALC 42 01/18/2022   LDLDIRECT 49.0 12/05/2020   TRIG 80.0 01/18/2022   CHOLHDL 2 01/18/2022   Last hemoglobin A1c Lab Results  Component Value Date   HGBA1C 5.4 01/10/2016   Last thyroid functions Lab Results  Component Value Date   TSH 4.76 01/18/2022   T4TOTAL 5.7 10/06/2018   Last vitamin D No results found for: "25OHVITD2", "25OHVITD3", "VD25OH" Last vitamin B12 and Folate Lab Results  Component Value Date   VITAMINB12 308 12/05/2020   FOLATE 15.8 01/06/2018      The ASCVD Risk score (Arnett DK, et al., 2019) failed to calculate for the following reasons:   The 2019 ASCVD  risk score is only valid for ages 28 to 32   The patient has a prior MI or stroke diagnosis    Assessment & Plan:   Problem List Items Addressed This Visit       Unprioritized   Nocturia   Relevant Orders   PSA   Memory loss - Primary    Pt agreed to be referred to neuro Mmse 25/30 and sundowning and aggressive towards his wife Mri brain and check labs  Wife is grateful for referral        Relevant Orders   RPR   MR Brain Wo Contrast   Ambulatory referral to Neurology   Hypothyroidism   Relevant Orders   TSH   Hyperlipidemia   Relevant Orders   Comprehensive metabolic panel   Lipid panel   B12 deficiency   Relevant Orders   Vitamin B12   Anemia   Relevant Orders   CBC with Differential/Platelet   Vitamin B12    Return in about 6 months (around 06/13/2023), or if symptoms worsen or fail to improve, for annual exam, fasting.    Donato Schultz, DO

## 2022-12-11 NOTE — Patient Instructions (Signed)

## 2022-12-12 LAB — COMPREHENSIVE METABOLIC PANEL
ALT: 10 U/L (ref 0–53)
AST: 15 U/L (ref 0–37)
Albumin: 3.5 g/dL (ref 3.5–5.2)
Alkaline Phosphatase: 73 U/L (ref 39–117)
BUN: 17 mg/dL (ref 6–23)
CO2: 29 mEq/L (ref 19–32)
Calcium: 8.5 mg/dL (ref 8.4–10.5)
Chloride: 102 mEq/L (ref 96–112)
Creatinine, Ser: 0.91 mg/dL (ref 0.40–1.50)
GFR: 71.65 mL/min (ref >60.00)
Glucose, Bld: 86 mg/dL (ref 70–99)
Potassium: 3.6 mEq/L (ref 3.5–5.1)
Sodium: 135 mEq/L (ref 135–145)
Total Bilirubin: 0.4 mg/dL (ref 0.2–1.2)
Total Protein: 8.1 g/dL (ref 6.0–8.3)

## 2022-12-12 LAB — CBC WITH DIFFERENTIAL/PLATELET
Basophils Absolute: 0 10*3/uL (ref 0.0–0.1)
Basophils Relative: 0.4 % (ref 0.0–3.0)
Eosinophils Absolute: 0.1 10*3/uL (ref 0.0–0.7)
Eosinophils Relative: 3 % (ref 0.0–5.0)
HCT: 30.2 % — ABNORMAL LOW (ref 39.0–52.0)
Hemoglobin: 10.4 g/dL — ABNORMAL LOW (ref 13.0–17.0)
Lymphocytes Relative: 26.9 % (ref 12.0–46.0)
Lymphs Abs: 1.2 10*3/uL (ref 0.7–4.0)
MCHC: 34.6 g/dL (ref 30.0–36.0)
MCV: 104.8 fl — ABNORMAL HIGH (ref 78.0–100.0)
Monocytes Absolute: 0.4 10*3/uL (ref 0.1–1.0)
Monocytes Relative: 8.6 % (ref 3.0–12.0)
Neutro Abs: 2.8 10*3/uL (ref 1.4–7.7)
Neutrophils Relative %: 61.1 % (ref 43.0–77.0)
Platelets: 212 10*3/uL (ref 150.0–400.0)
RBC: 2.88 Mil/uL — ABNORMAL LOW (ref 4.22–5.81)
RDW: 13.7 % (ref 11.5–15.5)
WBC: 4.6 10*3/uL (ref 4.0–10.5)

## 2022-12-12 LAB — TSH: TSH: 4.32 u[IU]/mL (ref 0.35–5.50)

## 2022-12-12 LAB — LIPID PANEL
Cholesterol: 113 mg/dL (ref 0–200)
HDL: 44.6 mg/dL (ref >39.00)
LDL Cholesterol: 52 mg/dL (ref 0–99)
NonHDL: 68.64
Total CHOL/HDL Ratio: 3
Triglycerides: 82 mg/dL (ref 0.0–149.0)
VLDL: 16.4 mg/dL (ref 0.0–40.0)

## 2022-12-12 LAB — RPR: RPR Ser Ql: NONREACTIVE

## 2022-12-12 LAB — VITAMIN B12: Vitamin B-12: >1500 pg/mL — ABNORMAL HIGH (ref 211–911)

## 2022-12-12 LAB — PSA: PSA: 2.47 ng/mL (ref 0.10–4.00)

## 2022-12-19 ENCOUNTER — Other Ambulatory Visit: Payer: Self-pay

## 2022-12-19 DIAGNOSIS — D649 Anemia, unspecified: Secondary | ICD-10-CM

## 2023-01-15 ENCOUNTER — Ambulatory Visit: Payer: PPO

## 2023-01-16 ENCOUNTER — Ambulatory Visit (INDEPENDENT_AMBULATORY_CARE_PROVIDER_SITE_OTHER): Payer: PPO

## 2023-01-16 DIAGNOSIS — E538 Deficiency of other specified B group vitamins: Secondary | ICD-10-CM

## 2023-01-16 MED ORDER — CYANOCOBALAMIN 1000 MCG/ML IJ SOLN
1000.0000 ug | Freq: Once | INTRAMUSCULAR | Status: AC
Start: 2023-01-16 — End: 2023-01-16
  Administered 2023-01-16: 1000 ug via INTRAMUSCULAR

## 2023-01-16 NOTE — Progress Notes (Signed)
Pt here for monthly B12 injection per Dr. Zola Button  B12 given IM R deltoid, and pt tolerated injection well.  Next appointment scheduled for 02/15/2023.

## 2023-01-21 ENCOUNTER — Other Ambulatory Visit: Payer: PPO

## 2023-01-21 ENCOUNTER — Other Ambulatory Visit (INDEPENDENT_AMBULATORY_CARE_PROVIDER_SITE_OTHER): Payer: PPO

## 2023-01-21 DIAGNOSIS — D649 Anemia, unspecified: Secondary | ICD-10-CM | POA: Diagnosis not present

## 2023-01-21 LAB — CBC WITH DIFFERENTIAL/PLATELET
Basophils Absolute: 0 10*3/uL (ref 0.0–0.1)
Basophils Relative: 0.7 % (ref 0.0–3.0)
Eosinophils Absolute: 0.2 10*3/uL (ref 0.0–0.7)
Eosinophils Relative: 3.5 % (ref 0.0–5.0)
HCT: 30.9 % — ABNORMAL LOW (ref 39.0–52.0)
Hemoglobin: 10.6 g/dL — ABNORMAL LOW (ref 13.0–17.0)
Lymphocytes Relative: 27 % (ref 12.0–46.0)
Lymphs Abs: 1.4 10*3/uL (ref 0.7–4.0)
MCHC: 34.4 g/dL (ref 30.0–36.0)
MCV: 103.9 fl — ABNORMAL HIGH (ref 78.0–100.0)
Monocytes Absolute: 0.6 10*3/uL (ref 0.1–1.0)
Monocytes Relative: 10.9 % (ref 3.0–12.0)
Neutro Abs: 3 10*3/uL (ref 1.4–7.7)
Neutrophils Relative %: 57.9 % (ref 43.0–77.0)
Platelets: 217 10*3/uL (ref 150.0–400.0)
RBC: 2.97 Mil/uL — ABNORMAL LOW (ref 4.22–5.81)
RDW: 13.3 % (ref 11.5–15.5)
WBC: 5.2 10*3/uL (ref 4.0–10.5)

## 2023-01-22 DIAGNOSIS — L538 Other specified erythematous conditions: Secondary | ICD-10-CM | POA: Diagnosis not present

## 2023-01-22 DIAGNOSIS — L820 Inflamed seborrheic keratosis: Secondary | ICD-10-CM | POA: Diagnosis not present

## 2023-01-22 DIAGNOSIS — D485 Neoplasm of uncertain behavior of skin: Secondary | ICD-10-CM | POA: Diagnosis not present

## 2023-01-22 DIAGNOSIS — L723 Sebaceous cyst: Secondary | ICD-10-CM | POA: Diagnosis not present

## 2023-01-22 DIAGNOSIS — R208 Other disturbances of skin sensation: Secondary | ICD-10-CM | POA: Diagnosis not present

## 2023-01-27 ENCOUNTER — Other Ambulatory Visit: Payer: Self-pay | Admitting: Family Medicine

## 2023-01-27 DIAGNOSIS — D649 Anemia, unspecified: Secondary | ICD-10-CM

## 2023-01-27 DIAGNOSIS — E538 Deficiency of other specified B group vitamins: Secondary | ICD-10-CM

## 2023-02-13 ENCOUNTER — Encounter: Payer: Self-pay | Admitting: Family Medicine

## 2023-02-15 ENCOUNTER — Other Ambulatory Visit: Payer: Self-pay | Admitting: Family Medicine

## 2023-02-15 ENCOUNTER — Ambulatory Visit: Payer: PPO

## 2023-02-15 DIAGNOSIS — E538 Deficiency of other specified B group vitamins: Secondary | ICD-10-CM | POA: Diagnosis not present

## 2023-02-15 DIAGNOSIS — D649 Anemia, unspecified: Secondary | ICD-10-CM

## 2023-02-15 MED ORDER — CYANOCOBALAMIN 1000 MCG/ML IJ SOLN
1000.0000 ug | Freq: Once | INTRAMUSCULAR | Status: AC
Start: 2023-02-15 — End: 2023-02-15
  Administered 2023-02-15: 1000 ug via INTRAMUSCULAR

## 2023-02-15 NOTE — Telephone Encounter (Signed)
Should we write a letter regarding his memory for his lawyers?

## 2023-02-15 NOTE — Progress Notes (Signed)
Justin [name] is a 89 y.o. male presents to the office today for monthly B12 injection, per physician's orders. Cyanocobalamin 1000 mg/ml IM was administered L deltoid today. Patient tolerated injection. Patient due for follow up labs/provider appt: No.   Per Teams conversation with PCP toady for order clarification: "He can switch to sublingual otc b12 and ask him to take iron supplement daily and do labs in 1 month"  Pt states he would like to continue the injections because they keep his energy levels up. Okay to continue per Dr Reynolds Bowl.   Patient next injection due: 1 month appt made Yes   Creft, Melton Alar L

## 2023-02-20 ENCOUNTER — Encounter (INDEPENDENT_AMBULATORY_CARE_PROVIDER_SITE_OTHER): Payer: Self-pay

## 2023-03-04 ENCOUNTER — Other Ambulatory Visit: Payer: Self-pay | Admitting: Family Medicine

## 2023-03-04 DIAGNOSIS — E039 Hypothyroidism, unspecified: Secondary | ICD-10-CM

## 2023-03-04 DIAGNOSIS — E785 Hyperlipidemia, unspecified: Secondary | ICD-10-CM

## 2023-03-04 DIAGNOSIS — R413 Other amnesia: Secondary | ICD-10-CM

## 2023-03-06 ENCOUNTER — Telehealth: Payer: Self-pay | Admitting: *Deleted

## 2023-03-06 NOTE — Progress Notes (Signed)
  Care Coordination   Note   03/06/2023 Name: Justin [name] MRN: [id number] DOB: Aug 30, 1924  Justin [name] is a 89 y.o. year old male who sees Zola Button, Grayling Congress, DO for primary care. I reached out to Marisa Sprinkles by phone today to offer care coordination services.  Mr. Counihan was given information about Care Coordination services today including:   The Care Coordination services include support from the care team which includes your Nurse Coordinator, Clinical Social Worker, or Pharmacist.  The Care Coordination team is here to help remove barriers to the health concerns and goals most important to you. Care Coordination services are voluntary, and the patient may decline or stop services at any time by request to their care team member.   Care Coordination Consent Status: Patient did not agree to participate in care coordination services at this time.  Follow up plan:  none indicated - pt and wife declined services at this time   Encounter Outcome:  Pt. Refused  Burman Nieves, CCMA Care Coordination Care Guide Direct Dial: [phone]

## 2023-03-11 ENCOUNTER — Institutional Professional Consult (permissible substitution): Payer: Medicare Other | Admitting: Neurology

## 2023-03-11 ENCOUNTER — Other Ambulatory Visit: Payer: Self-pay | Admitting: Family Medicine

## 2023-03-11 DIAGNOSIS — E785 Hyperlipidemia, unspecified: Secondary | ICD-10-CM

## 2023-03-20 ENCOUNTER — Ambulatory Visit: Payer: PPO

## 2023-03-21 ENCOUNTER — Ambulatory Visit (INDEPENDENT_AMBULATORY_CARE_PROVIDER_SITE_OTHER): Payer: PPO

## 2023-03-21 DIAGNOSIS — E538 Deficiency of other specified B group vitamins: Secondary | ICD-10-CM | POA: Diagnosis not present

## 2023-03-21 MED ORDER — CYANOCOBALAMIN 1000 MCG/ML IJ SOLN
1000.0000 ug | Freq: Once | INTRAMUSCULAR | Status: AC
Start: 2023-03-21 — End: 2023-03-21
  Administered 2023-03-21: 1000 ug via INTRAMUSCULAR

## 2023-03-21 NOTE — Progress Notes (Signed)
Justin [name] is a 89 y.o. male presents to the office today for B12 injections, per physician's orders. Original order: Pt states he would like to continue the injections because they keep his energy levels up. Okay to continue per Dr Reynolds Bowl.  Cyanocobalamin (med), 1000 mg/ml (dose),  IM (route) was administered right arm (location) today. Patient tolerated injection.  Patient next injection due: in one month, appt made Yes, scheduled for 10-04 2024.  Justin [name]

## 2023-04-25 ENCOUNTER — Other Ambulatory Visit: Payer: Self-pay | Admitting: Family Medicine

## 2023-04-25 DIAGNOSIS — N138 Other obstructive and reflux uropathy: Secondary | ICD-10-CM

## 2023-04-26 ENCOUNTER — Ambulatory Visit (INDEPENDENT_AMBULATORY_CARE_PROVIDER_SITE_OTHER): Payer: PPO | Admitting: *Deleted

## 2023-04-26 DIAGNOSIS — E538 Deficiency of other specified B group vitamins: Secondary | ICD-10-CM | POA: Diagnosis not present

## 2023-04-26 MED ORDER — CYANOCOBALAMIN 1000 MCG/ML IJ SOLN
1000.0000 ug | Freq: Once | INTRAMUSCULAR | Status: AC
Start: 2023-04-26 — End: 2023-04-26
  Administered 2023-04-26: 1000 ug via INTRAMUSCULAR

## 2023-04-26 NOTE — Progress Notes (Signed)
Justin [name] is a 89 y.o. male presents to the office today for B12 injections, per physician's orders. Original order: Pt states he would like to continue the injections because they keep his energy levels up. Okay to continue per Dr Reynolds Bowl.  Cyanocobalamin (med), 1000 mg/ml (dose),  IM (route) was administered LD today. Patient tolerated injection.   Patient next injection due: in one month, appt made Yes, scheduled for 05/29/2023.

## 2023-05-09 DIAGNOSIS — C44311 Basal cell carcinoma of skin of nose: Secondary | ICD-10-CM | POA: Diagnosis not present

## 2023-05-09 DIAGNOSIS — Z86007 Personal history of in-situ neoplasm of skin: Secondary | ICD-10-CM | POA: Diagnosis not present

## 2023-05-09 DIAGNOSIS — L814 Other melanin hyperpigmentation: Secondary | ICD-10-CM | POA: Diagnosis not present

## 2023-05-09 DIAGNOSIS — L578 Other skin changes due to chronic exposure to nonionizing radiation: Secondary | ICD-10-CM | POA: Diagnosis not present

## 2023-05-09 DIAGNOSIS — L821 Other seborrheic keratosis: Secondary | ICD-10-CM | POA: Diagnosis not present

## 2023-05-09 DIAGNOSIS — Z08 Encounter for follow-up examination after completed treatment for malignant neoplasm: Secondary | ICD-10-CM | POA: Diagnosis not present

## 2023-05-29 ENCOUNTER — Ambulatory Visit (INDEPENDENT_AMBULATORY_CARE_PROVIDER_SITE_OTHER): Payer: PPO

## 2023-05-29 DIAGNOSIS — E538 Deficiency of other specified B group vitamins: Secondary | ICD-10-CM

## 2023-05-29 MED ORDER — CYANOCOBALAMIN 1000 MCG/ML IJ SOLN
1000.0000 ug | Freq: Once | INTRAMUSCULAR | Status: AC
Start: 2023-05-29 — End: 2023-05-29
  Administered 2023-05-29: 1000 ug via INTRAMUSCULAR

## 2023-05-29 NOTE — Progress Notes (Signed)
Pt here for monthly B12 injection per Lowne  B12 given IM, and pt tolerated injection well.  Pt will get next B12 during OV with Lowne on 10/06

## 2023-06-07 DIAGNOSIS — L244 Irritant contact dermatitis due to drugs in contact with skin: Secondary | ICD-10-CM | POA: Diagnosis not present

## 2023-06-07 DIAGNOSIS — C44311 Basal cell carcinoma of skin of nose: Secondary | ICD-10-CM | POA: Diagnosis not present

## 2023-06-07 DIAGNOSIS — L538 Other specified erythematous conditions: Secondary | ICD-10-CM | POA: Diagnosis not present

## 2023-06-07 DIAGNOSIS — L820 Inflamed seborrheic keratosis: Secondary | ICD-10-CM | POA: Diagnosis not present

## 2023-06-07 DIAGNOSIS — R208 Other disturbances of skin sensation: Secondary | ICD-10-CM | POA: Diagnosis not present

## 2023-06-28 ENCOUNTER — Encounter: Payer: PPO | Admitting: Family Medicine

## 2023-07-03 ENCOUNTER — Ambulatory Visit (INDEPENDENT_AMBULATORY_CARE_PROVIDER_SITE_OTHER): Payer: PPO | Admitting: *Deleted

## 2023-07-03 DIAGNOSIS — Z23 Encounter for immunization: Secondary | ICD-10-CM

## 2023-07-03 DIAGNOSIS — E538 Deficiency of other specified B group vitamins: Secondary | ICD-10-CM

## 2023-07-03 MED ORDER — CYANOCOBALAMIN 1000 MCG/ML IJ SOLN
1000.0000 ug | Freq: Once | INTRAMUSCULAR | Status: AC
  Administered 2023-07-03: 1000 ug via INTRAMUSCULAR

## 2023-07-03 NOTE — Progress Notes (Signed)
Patient here for monthly b12 injection per physicians order.  Patient also wanted a flu vaccine.   Injection for B12  given in left deltoid and flu vaccine in right deltoid.  Pt needs to reschedule for CPE but he did not want to do it.  He will call for his appointment for next b12.

## 2023-07-12 DIAGNOSIS — L578 Other skin changes due to chronic exposure to nonionizing radiation: Secondary | ICD-10-CM | POA: Diagnosis not present

## 2023-07-12 DIAGNOSIS — Z09 Encounter for follow-up examination after completed treatment for conditions other than malignant neoplasm: Secondary | ICD-10-CM | POA: Diagnosis not present

## 2023-07-12 DIAGNOSIS — L570 Actinic keratosis: Secondary | ICD-10-CM | POA: Diagnosis not present

## 2023-07-12 DIAGNOSIS — C44311 Basal cell carcinoma of skin of nose: Secondary | ICD-10-CM | POA: Diagnosis not present

## 2023-09-05 ENCOUNTER — Ambulatory Visit: Payer: PPO

## 2023-09-05 DIAGNOSIS — E538 Deficiency of other specified B group vitamins: Secondary | ICD-10-CM

## 2023-09-05 MED ORDER — CYANOCOBALAMIN 1000 MCG/ML IJ SOLN
1000.0000 ug | Freq: Once | INTRAMUSCULAR | Status: AC
  Administered 2023-09-05: 1000 ug via INTRAMUSCULAR

## 2023-09-05 NOTE — Progress Notes (Addendum)
Justin [name] is a 89 y.o. male presents to the office today for Monthly B12 injection, per physician's orders. Original order: 01/2023: He can switch to sublingual otc b12 and ask him to take iron supplement daily and do labs in 1 month"  Last B12 lab: 12/11/22: >1500 Cyanocobalamin 1000 mg/ml IM was administered L Deltoid today. Patient tolerated injection. Patient due for follow up labs/provider appt: Yes. Date due: 6 months (around 06/13/2023)- was made for 06/28/23, then cx. appt made Yes Patient next injection due: 1 month, appt made Yes  Creft, Melton Alar L

## 2023-09-17 ENCOUNTER — Telehealth: Payer: Self-pay | Admitting: Family Medicine

## 2023-09-17 NOTE — Telephone Encounter (Signed)
 Patient's wife Thurston Hole said she spoke with Dr. Posey Rea about taking her husband on as a new patient. Is this okay?

## 2023-09-18 NOTE — Telephone Encounter (Signed)
 Copied from CRM [id number]. Topic: Appointments - Transfer of Care >> Sep 18, 2023 10:00 AM Chantha C wrote: Pt is requesting to transfer FROM: Dr. Seabron Spates  Pt is requesting to transfer TO: Dr. Jacinta Shoe  Reason for requested transfer: Patient needs a medical doctor, patient's wife Thurston Hole states Dr. Posey Rea agreed to see patient. Please advise can call back at [phone] It is the responsibility of the team the patient would like to transfer to (Dr. Jacinta Shoe) to reach out to the patient if for any reason this transfer is not acceptable.

## 2023-09-19 NOTE — Telephone Encounter (Signed)
 Okay with me.  They cannot drive far now.  Thank you

## 2023-10-08 ENCOUNTER — Encounter: Payer: PPO | Admitting: Family Medicine

## 2023-10-09 ENCOUNTER — Ambulatory Visit

## 2023-10-10 ENCOUNTER — Ambulatory Visit (INDEPENDENT_AMBULATORY_CARE_PROVIDER_SITE_OTHER)

## 2023-10-10 DIAGNOSIS — E538 Deficiency of other specified B group vitamins: Secondary | ICD-10-CM | POA: Diagnosis not present

## 2023-10-10 MED ORDER — CYANOCOBALAMIN 1000 MCG/ML IJ SOLN
1000.0000 ug | Freq: Once | INTRAMUSCULAR | Status: AC
  Administered 2023-10-10: 1000 ug via INTRAMUSCULAR

## 2023-10-10 NOTE — Progress Notes (Cosign Needed Addendum)
 Pt here for monthly B12 injection per PCP  B12 given IM L deltoid, and pt tolerated injection well.  Next B12 injection scheduled for 11/13/2023.

## 2023-10-15 ENCOUNTER — Encounter: Payer: Self-pay | Admitting: Internal Medicine

## 2023-10-15 ENCOUNTER — Ambulatory Visit (INDEPENDENT_AMBULATORY_CARE_PROVIDER_SITE_OTHER): Payer: PPO | Admitting: Internal Medicine

## 2023-10-15 VITALS — BP 116/68 | HR 64 | Temp 97.5°F | Ht 70.0 in | Wt 186.4 lb

## 2023-10-15 DIAGNOSIS — F03911 Unspecified dementia, unspecified severity, with agitation: Secondary | ICD-10-CM | POA: Insufficient documentation

## 2023-10-15 DIAGNOSIS — E039 Hypothyroidism, unspecified: Secondary | ICD-10-CM

## 2023-10-15 DIAGNOSIS — R413 Other amnesia: Secondary | ICD-10-CM | POA: Diagnosis not present

## 2023-10-15 DIAGNOSIS — E538 Deficiency of other specified B group vitamins: Secondary | ICD-10-CM

## 2023-10-15 DIAGNOSIS — E559 Vitamin D deficiency, unspecified: Secondary | ICD-10-CM

## 2023-10-15 LAB — T4, FREE: Free T4: 0.8 ng/dL (ref 0.60–1.60)

## 2023-10-15 LAB — CBC WITH DIFFERENTIAL/PLATELET
Basophils Absolute: 0 10*3/uL (ref 0.0–0.1)
Basophils Relative: 0.9 % (ref 0.0–3.0)
Eosinophils Absolute: 0.1 10*3/uL (ref 0.0–0.7)
Eosinophils Relative: 2.6 % (ref 0.0–5.0)
HCT: 33.8 % — ABNORMAL LOW (ref 39.0–52.0)
Hemoglobin: 11.6 g/dL — ABNORMAL LOW (ref 13.0–17.0)
Lymphocytes Relative: 23.9 % (ref 12.0–46.0)
Lymphs Abs: 1.2 10*3/uL (ref 0.7–4.0)
MCHC: 34.4 g/dL (ref 30.0–36.0)
MCV: 103.7 fl — ABNORMAL HIGH (ref 78.0–100.0)
Monocytes Absolute: 0.5 10*3/uL (ref 0.1–1.0)
Monocytes Relative: 10.4 % (ref 3.0–12.0)
Neutro Abs: 3.1 10*3/uL (ref 1.4–7.7)
Neutrophils Relative %: 62.2 % (ref 43.0–77.0)
Platelets: 229 10*3/uL (ref 150.0–400.0)
RBC: 3.26 Mil/uL — ABNORMAL LOW (ref 4.22–5.81)
RDW: 14 % (ref 11.5–15.5)
WBC: 5 10*3/uL (ref 4.0–10.5)

## 2023-10-15 LAB — COMPREHENSIVE METABOLIC PANEL
ALT: 11 U/L (ref 0–53)
AST: 16 U/L (ref 0–37)
Albumin: 3.8 g/dL (ref 3.5–5.2)
Alkaline Phosphatase: 74 U/L (ref 39–117)
BUN: 19 mg/dL (ref 6–23)
CO2: 30 meq/L (ref 19–32)
Calcium: 8.8 mg/dL (ref 8.4–10.5)
Chloride: 104 meq/L (ref 96–112)
Creatinine, Ser: 0.96 mg/dL (ref 0.40–1.50)
GFR: 66.8 mL/min (ref >60.00)
Glucose, Bld: 95 mg/dL (ref 70–99)
Potassium: 3.9 meq/L (ref 3.5–5.1)
Sodium: 137 meq/L (ref 135–145)
Total Bilirubin: 0.6 mg/dL (ref 0.2–1.2)
Total Protein: 9 g/dL — ABNORMAL HIGH (ref 6.0–8.3)

## 2023-10-15 LAB — URINALYSIS
Bilirubin Urine: NEGATIVE
Hgb urine dipstick: NEGATIVE
Ketones, ur: NEGATIVE
Leukocytes,Ua: NEGATIVE
Nitrite: NEGATIVE
Specific Gravity, Urine: 1.015 (ref 1.000–1.030)
Total Protein, Urine: NEGATIVE
Urine Glucose: NEGATIVE
Urobilinogen, UA: 0.2 (ref 0.0–1.0)
pH: 6 (ref 5.0–8.0)

## 2023-10-15 LAB — TSH: TSH: 5.54 u[IU]/mL — ABNORMAL HIGH (ref 0.35–5.50)

## 2023-10-15 LAB — LIPID PANEL
Cholesterol: 131 mg/dL (ref 0–200)
HDL: 45.8 mg/dL (ref >39.00)
LDL Cholesterol: 65 mg/dL (ref 0–99)
NonHDL: 85.13
Total CHOL/HDL Ratio: 3
Triglycerides: 99 mg/dL (ref 0.0–149.0)
VLDL: 19.8 mg/dL (ref 0.0–40.0)

## 2023-10-15 LAB — VITAMIN B12: Vitamin B-12: 526 pg/mL (ref 211–911)

## 2023-10-15 LAB — VITAMIN D 25 HYDROXY (VIT D DEFICIENCY, FRACTURES): VITD: 24.3 ng/mL — ABNORMAL LOW (ref 30.00–100.00)

## 2023-10-15 MED ORDER — QUETIAPINE FUMARATE 25 MG PO TABS: 25.0000 mg | ORAL_TABLET | Freq: Every day | ORAL | 5 refills | Status: DC

## 2023-10-15 NOTE — Assessment & Plan Note (Signed)
 Age related dementia Per Thurston Hole: agitation at night, wondering, looking for men in the house and outside Will try a low dose Seroquel w/caution  Potential benefits of a long term Seroque use as well as potential risks  and complications were explained to the patient and were aknowledged.

## 2023-10-15 NOTE — Assessment & Plan Note (Signed)
On b12 

## 2023-10-15 NOTE — Assessment & Plan Note (Signed)
 Check TSH On Levothyroxine

## 2023-10-15 NOTE — Progress Notes (Signed)
 Subjective:  Patient ID: Justin [name], male    DOB: February 23, 1925  Age: 89 y.o. MRN: [id number]  CC: Establish Care (Establishing Care.)   HPI [place] Hollar presents for a new pt visit C/o neuropathy on B feet C/o agitation at night, wondering, looking for men in the house and outside He is here w/wife Thurston Hole  Outpatient Medications Prior to Visit  Medication Sig Dispense Refill   aspirin 81 MG tablet Take 81 mg by mouth daily.     finasteride (PROSCAR) 5 MG tablet Take 1 tablet (5 mg total) by mouth daily. 90 tablet 1   fluticasone (FLONASE) 50 MCG/ACT nasal spray Place 2 sprays into both nostrils daily. 16 g 6   simvastatin (ZOCOR) 20 MG tablet TAKE 1 TABLET BY MOUTH EVERY DAY 90 tablet 1   levocetirizine (XYZAL) 5 MG tablet Take 1 tablet (5 mg total) by mouth every evening. (Patient not taking: Reported on 10/15/2023) 30 tablet 5   Syringe/Needle, Disp, (SYRINGE 3CC/25GX5/8") 25G X 5/8" 3 ML MISC Use with b12 every 2 weeks (Patient not taking: Reported on 10/15/2023) 10 each 1   HYDROcodone-acetaminophen (NORCO/VICODIN) 5-325 MG tablet Take 1 tablet by mouth every 8 (eight) hours as needed. (Patient not taking: Reported on 10/15/2023) 15 tablet 0   traMADol (ULTRAM) 50 MG tablet Take 1 tablet (50 mg total) by mouth every 6 (six) hours as needed. (Patient not taking: Reported on 10/15/2023) 20 tablet 0   No facility-administered medications prior to visit.    ROS: Review of Systems  Constitutional:  Negative for appetite change, fatigue and unexpected weight change.  HENT:  Negative for congestion, nosebleeds, sneezing, sore throat and trouble swallowing.   Eyes:  Negative for itching and visual disturbance.  Respiratory:  Negative for cough.   Cardiovascular:  Negative for chest pain, palpitations and leg swelling.  Gastrointestinal:  Negative for abdominal distention, blood in stool, diarrhea and nausea.  Genitourinary:  Negative for frequency and hematuria.  Musculoskeletal:   Negative for back pain, gait problem, joint swelling and neck pain.  Skin:  Negative for rash.  Neurological:  Negative for dizziness, tremors, speech difficulty and weakness.  Hematological:  Does not bruise/bleed easily.  Psychiatric/Behavioral:  Positive for agitation, behavioral problems, confusion, decreased concentration, hallucinations and sleep disturbance. Negative for dysphoric mood, self-injury and suicidal ideas. The patient is not nervous/anxious.     Objective:  BP 116/68   Pulse 64   Temp (!) 97.5 F (36.4 C)   Ht 5\' 10"  (1.778 m)   Wt 186 lb 6.4 oz (84.6 kg)   SpO2 95%   BMI 26.75 kg/m   BP Readings from Last 3 Encounters:  10/15/23 116/68  12/11/22 (!) 140/70  02/05/22 120/70    Wt Readings from Last 3 Encounters:  10/15/23 186 lb 6.4 oz (84.6 kg)  12/11/22 190 lb (86.2 kg)  02/05/22 184 lb (83.5 kg)    Physical Exam Constitutional:      General: He is not in acute distress.    Appearance: He is well-developed.     Comments: NAD  Eyes:     Conjunctiva/sclera: Conjunctivae normal.     Pupils: Pupils are equal, round, and reactive to light.  Neck:     Thyroid: No thyromegaly.     Vascular: No JVD.  Cardiovascular:     Rate and Rhythm: Normal rate and regular rhythm.     Heart sounds: Normal heart sounds. No murmur heard.    No friction rub. No gallop.  Pulmonary:     Effort: Pulmonary effort is normal. No respiratory distress.     Breath sounds: Normal breath sounds. No wheezing or rales.  Chest:     Chest wall: No tenderness.  Abdominal:     General: Bowel sounds are normal. There is no distension.     Palpations: Abdomen is soft. There is no mass.     Tenderness: There is no abdominal tenderness. There is no guarding or rebound.  Musculoskeletal:        General: No tenderness. Normal range of motion.     Cervical back: Normal range of motion.  Lymphadenopathy:     Cervical: No cervical adenopathy.  Skin:    General: Skin is warm and dry.      Findings: No rash.  Neurological:     Mental Status: He is alert.     Cranial Nerves: No cranial nerve deficit.     Motor: No weakness or abnormal muscle tone.     Coordination: Coordination abnormal.     Gait: Gait abnormal.     Deep Tendon Reflexes: Reflexes are normal and symmetric.  Psychiatric:        Behavior: Behavior normal.        Thought Content: Thought content normal.   Looks yonger Alert and cooperative  Lab Results  Component Value Date   WBC 5.2 01/21/2023   HGB 10.6 (L) 01/21/2023   HCT 30.9 (L) 01/21/2023   PLT 217.0 01/21/2023   GLUCOSE 86 12/11/2022   CHOL 113 12/11/2022   TRIG 82.0 12/11/2022   HDL 44.60 12/11/2022   LDLDIRECT 49.0 12/05/2020   LDLCALC 52 12/11/2022   ALT 10 12/11/2022   AST 15 12/11/2022   NA 135 12/11/2022   K 3.6 12/11/2022   CL 102 12/11/2022   CREATININE 0.91 12/11/2022   BUN 17 12/11/2022   CO2 29 12/11/2022   TSH 4.32 12/11/2022   PSA 2.47 12/11/2022   HGBA1C 5.4 01/10/2016   MICROALBUR 1.3 01/10/2016    US Venous Img Lower Unilateral Left Result Date: 02/01/2022 CLINICAL DATA:  Left lower extremity pain EXAM: LEFT LOWER EXTREMITY VENOUS DOPPLER ULTRASOUND TECHNIQUE: Gray-scale sonography with compression, as well as color and duplex ultrasound, were performed to evaluate the deep venous system(s) from the level of the common femoral vein through the popliteal and proximal calf veins. COMPARISON:  None Available. FINDINGS: VENOUS Normal compressibility of the common femoral, superficial femoral, and popliteal veins, as well as the visualized calf veins. Visualized portions of profunda femoral vein and great saphenous vein unremarkable. No filling defects to suggest DVT on grayscale or color Doppler imaging. Doppler waveforms show normal direction of venous flow, normal respiratory plasticity and response to augmentation. Limited views of the contralateral common femoral vein are unremarkable. OTHER None. Limitations: none  IMPRESSION: Negative. Electronically Signed   By: Charlett Nose M.D.   On: 02/01/2022 18:57   DG Knee 2 Views Left Result Date: 02/01/2022 CLINICAL DATA:  Left knee pain.  No history of trauma. EXAM: LEFT KNEE - 1-2 VIEW COMPARISON:  None available FINDINGS: Mild chronic enthesopathic change at the quadriceps insertion on the patella. Minimal medial compartment joint space narrowing. No acute fracture or dislocation. IMPRESSION: Minimal medial compartment osteoarthritis. Electronically Signed   By: Neita Garnet M.D.   On: 02/01/2022 18:30    Assessment & Plan:   Problem List Items Addressed This Visit     B12 deficiency   On b12      Hypothyroidism  Check TSH On Levothyroxine      Memory loss   Age related dementia Per Anne: agitation at night, wondering, looking for men in the house and outside Will try a low dose Seroquel w/caution  Potential benefits of a long term Seroque use as well as potential risks  and complications were explained to the patient and were aknowledged.         Agitation due to dementia [facility]) - Primary   Age related dementia Per Anne: agitation at night, wondering, looking for men in the house and outside Will try a low dose Seroquel w/caution  Potential benefits of a long term Seroque use as well as potential risks  and complications were explained to the patient and were aknowledged.         No orders of the defined types were placed in this encounter.     Follow-up: No follow-ups on file.  Sonda Primes, MD

## 2023-10-16 LAB — IRON,TIBC AND FERRITIN PANEL
%SAT: 45 % (ref 20–48)
Ferritin: 58 ng/mL (ref 24–380)
Iron: 117 ug/dL (ref 50–180)
TIBC: 261 ug/dL (ref 250–425)

## 2023-10-18 ENCOUNTER — Other Ambulatory Visit: Payer: Self-pay | Admitting: Internal Medicine

## 2023-10-18 MED ORDER — VITAMIN D3 50 MCG (2000 UT) PO CAPS: 2000.0000 [IU] | ORAL_CAPSULE | Freq: Every day | ORAL | 3 refills | Status: DC

## 2023-11-09 ENCOUNTER — Other Ambulatory Visit: Payer: Self-pay | Admitting: Internal Medicine

## 2023-11-09 DIAGNOSIS — R413 Other amnesia: Secondary | ICD-10-CM

## 2023-11-09 DIAGNOSIS — E039 Hypothyroidism, unspecified: Secondary | ICD-10-CM

## 2023-11-09 DIAGNOSIS — F03911 Unspecified dementia, unspecified severity, with agitation: Secondary | ICD-10-CM

## 2023-11-12 ENCOUNTER — Ambulatory Visit (INDEPENDENT_AMBULATORY_CARE_PROVIDER_SITE_OTHER)

## 2023-11-12 DIAGNOSIS — E538 Deficiency of other specified B group vitamins: Secondary | ICD-10-CM

## 2023-11-12 MED ORDER — CYANOCOBALAMIN 1000 MCG/ML IJ SOLN
1000.0000 ug | Freq: Once | INTRAMUSCULAR | Status: AC
  Administered 2023-11-12: 1000 ug via INTRAMUSCULAR

## 2023-11-12 NOTE — Progress Notes (Cosign Needed)
 Patient visits today for their b-12 injection. Patient informed of what they had received and tolerated injection well. Patient notified to reach out to office if needed.   Medical screening examination/treatment/procedure(s) were performed by non-physician practitioner and as supervising physician I was immediately available for consultation/collaboration.  I agree with above. Jacinta Shoe, MD

## 2023-11-12 NOTE — Progress Notes (Deleted)
 Pt has now established with Plotnikov, [name], MD at [facility]   Justin [name] is a 89 y.o. male presents to the office today for Monthly B12 injection, per physician's orders. Original order: 01/2023: He can switch to sublingual otc b12 and ask him to take iron supplement daily and do labs in 1 month"  Last B12 lab: 12/11/22: >1500 Cyanocobalamin  1000 mcg/ml IM was administered *** Deltoid today. Patient tolerated injection. Patient due for follow up labs/provider appt: {yes/no:20286}. Date due: ***, appt made {yes/no:20286} Patient next injection due: 1 month, appt made {yes/no:20286}  Creft, [name]

## 2023-11-13 ENCOUNTER — Ambulatory Visit

## 2023-11-20 DIAGNOSIS — L821 Other seborrheic keratosis: Secondary | ICD-10-CM | POA: Diagnosis not present

## 2023-11-20 DIAGNOSIS — L814 Other melanin hyperpigmentation: Secondary | ICD-10-CM | POA: Diagnosis not present

## 2023-11-20 DIAGNOSIS — C44311 Basal cell carcinoma of skin of nose: Secondary | ICD-10-CM | POA: Diagnosis not present

## 2023-11-20 DIAGNOSIS — Z09 Encounter for follow-up examination after completed treatment for conditions other than malignant neoplasm: Secondary | ICD-10-CM | POA: Diagnosis not present

## 2023-11-20 DIAGNOSIS — L570 Actinic keratosis: Secondary | ICD-10-CM | POA: Diagnosis not present

## 2023-12-16 ENCOUNTER — Other Ambulatory Visit: Payer: Self-pay | Admitting: Family Medicine

## 2023-12-16 DIAGNOSIS — E785 Hyperlipidemia, unspecified: Secondary | ICD-10-CM

## 2023-12-31 DIAGNOSIS — H353134 Nonexudative age-related macular degeneration, bilateral, advanced atrophic with subfoveal involvement: Secondary | ICD-10-CM | POA: Diagnosis not present

## 2023-12-31 DIAGNOSIS — H43813 Vitreous degeneration, bilateral: Secondary | ICD-10-CM | POA: Diagnosis not present

## 2023-12-31 DIAGNOSIS — H524 Presbyopia: Secondary | ICD-10-CM | POA: Diagnosis not present

## 2023-12-31 DIAGNOSIS — H02105 Unspecified ectropion of left lower eyelid: Secondary | ICD-10-CM | POA: Diagnosis not present

## 2023-12-31 DIAGNOSIS — H52202 Unspecified astigmatism, left eye: Secondary | ICD-10-CM | POA: Diagnosis not present

## 2023-12-31 DIAGNOSIS — H02831 Dermatochalasis of right upper eyelid: Secondary | ICD-10-CM | POA: Diagnosis not present

## 2023-12-31 DIAGNOSIS — Z961 Presence of intraocular lens: Secondary | ICD-10-CM | POA: Diagnosis not present

## 2023-12-31 DIAGNOSIS — H02834 Dermatochalasis of left upper eyelid: Secondary | ICD-10-CM | POA: Diagnosis not present

## 2024-02-03 ENCOUNTER — Ambulatory Visit: Admitting: Internal Medicine

## 2024-02-04 ENCOUNTER — Ambulatory Visit: Admitting: Internal Medicine

## 2024-02-04 ENCOUNTER — Encounter: Payer: Self-pay | Admitting: Internal Medicine

## 2024-02-04 VITALS — BP 110/68 | HR 72 | Temp 98.0°F | Ht 70.0 in | Wt 189.0 lb

## 2024-02-04 DIAGNOSIS — N189 Chronic kidney disease, unspecified: Secondary | ICD-10-CM

## 2024-02-04 DIAGNOSIS — E039 Hypothyroidism, unspecified: Secondary | ICD-10-CM | POA: Diagnosis not present

## 2024-02-04 DIAGNOSIS — F03911 Unspecified dementia, unspecified severity, with agitation: Secondary | ICD-10-CM

## 2024-02-04 DIAGNOSIS — R413 Other amnesia: Secondary | ICD-10-CM | POA: Diagnosis not present

## 2024-02-04 DIAGNOSIS — E538 Deficiency of other specified B group vitamins: Secondary | ICD-10-CM | POA: Diagnosis not present

## 2024-02-04 MED ORDER — QUETIAPINE FUMARATE 25 MG PO TABS: 50.0000 mg | ORAL_TABLET | Freq: Every day | ORAL | 2 refills | Status: AC

## 2024-02-04 NOTE — Assessment & Plan Note (Signed)
 Age related dementia - worse Per Arlean: agitation at night, wondering, looking for men in the house and outside Increase Seroquel  to 50 mg at hs w/caution  Potential benefits of a long term Seroque use as well as potential risks  and complications were explained to the patient and were aknowledged.

## 2024-02-04 NOTE — Progress Notes (Signed)
 Subjective:  Patient ID: Justin [name], male    DOB: 08/03/1924  Age: 89 y.o. MRN: [id number]  CC: Medical Management of Chronic Issues (Pts has concerns about his B12 as he was in formed from another provider to stop taking it.//FYI: Pts wife has concerns about pt needing medication for anxiety to help pt sleep as pt is having issues sleeping, pts ia having increased weakness in his legs, becoming aggressive verbally and is accusing others of stealing his dress hat.)   HPI Phyllip Claw presents for: Pts has concerns about his B12 as he was in formed from another provider to stop taking it.//FYI: Pts wife has concerns about pt needing medication for anxiety to help pt sleep as pt is having issues sleeping, pts ia having increased weakness in his legs, becoming aggressive verbally and is accusing others of stealing his dress hat.  Outpatient Medications Prior to Visit  Medication Sig Dispense Refill  . aspirin 81 MG tablet Take 81 mg by mouth daily.    . Cholecalciferol (VITAMIN D3) 50 MCG (2000 UT) capsule Take 1 capsule (2,000 Units total) by mouth daily. 100 capsule 3  . finasteride  (PROSCAR ) 5 MG tablet Take 1 tablet (5 mg total) by mouth daily. 90 tablet 1  . fluticasone  (FLONASE ) 50 MCG/ACT nasal spray Place 2 sprays into both nostrils daily. 16 g 6  . sildenafil (VIAGRA) 100 MG tablet Take 100 mg by mouth.    . simvastatin  (ZOCOR ) 20 MG tablet Take 1 tablet (20 mg total) by mouth daily. Pt needs office visit for further refills 90 tablet 1  . QUEtiapine  (SEROQUEL ) 25 MG tablet TAKE 1 TABLET BY MOUTH EVERYDAY AT BEDTIME 90 tablet 2  . levocetirizine (XYZAL ) 5 MG tablet Take 1 tablet (5 mg total) by mouth every evening. (Patient not taking: Reported on 02/04/2024) 30 tablet 5  . Syringe/Needle, Disp, (SYRINGE 3CC/25GX5/8) 25G X 5/8 3 ML MISC Use with b12 every 2 weeks (Patient not taking: Reported on 02/04/2024) 10 each 1   No facility-administered medications prior to visit.     ROS: Review of Systems  Constitutional:  Negative for appetite change, fatigue and unexpected weight change.  HENT:  Negative for congestion, nosebleeds, sneezing, sore throat and trouble swallowing.   Eyes:  Negative for itching and visual disturbance.  Respiratory:  Negative for cough.   Cardiovascular:  Negative for chest pain, palpitations and leg swelling.  Gastrointestinal:  Negative for abdominal distention, blood in stool, diarrhea and nausea.  Genitourinary:  Negative for frequency and hematuria.  Musculoskeletal:  Positive for gait problem. Negative for arthralgias, back pain, joint swelling and neck pain.  Skin:  Negative for rash.  Neurological:  Negative for dizziness, tremors, speech difficulty and weakness.  Hematological:  Does not bruise/bleed easily.  Psychiatric/Behavioral:  Positive for confusion, decreased concentration and dysphoric mood. Negative for agitation, sleep disturbance and suicidal ideas. The patient is nervous/anxious.     Objective:  BP 110/68   Pulse 72   Temp 98 F (36.7 C) (Oral)   Ht 5' 10 (1.778 m)   Wt 189 lb (85.7 kg)   SpO2 97%   BMI 27.12 kg/m   BP Readings from Last 3 Encounters:  02/04/24 110/68  10/15/23 116/68  12/11/22 (!) 140/70    Wt Readings from Last 3 Encounters:  02/04/24 189 lb (85.7 kg)  10/15/23 186 lb 6.4 oz (84.6 kg)  12/11/22 190 lb (86.2 kg)    Physical Exam Constitutional:      General:  He is not in acute distress.    Appearance: Normal appearance. He is well-developed.     Comments: NAD  Eyes:     Conjunctiva/sclera: Conjunctivae normal.     Pupils: Pupils are equal, round, and reactive to light.  Neck:     Thyroid : No thyromegaly.     Vascular: No JVD.  Cardiovascular:     Rate and Rhythm: Normal rate and regular rhythm.     Heart sounds: Normal heart sounds. No murmur heard.    No friction rub. No gallop.  Pulmonary:     Effort: Pulmonary effort is normal. No respiratory distress.      Breath sounds: Normal breath sounds. No wheezing or rales.  Chest:     Chest wall: No tenderness.  Abdominal:     General: Bowel sounds are normal. There is no distension.     Palpations: Abdomen is soft. There is no mass.     Tenderness: There is no abdominal tenderness. There is no guarding or rebound.  Musculoskeletal:        General: No tenderness. Normal range of motion.     Cervical back: Normal range of motion.  Lymphadenopathy:     Cervical: No cervical adenopathy.  Skin:    General: Skin is warm and dry.     Findings: No rash.  Neurological:     Mental Status: He is alert and oriented to person, place, and time.     Cranial Nerves: No cranial nerve deficit.     Motor: No abnormal muscle tone.     Coordination: Coordination abnormal.     Gait: Gait normal.     Deep Tendon Reflexes: Reflexes are normal and symmetric.  Psychiatric:        Behavior: Behavior normal.   Arthritic gait  Lab Results  Component Value Date   WBC 5.0 10/15/2023   HGB 11.6 (L) 10/15/2023   HCT 33.8 (L) 10/15/2023   PLT 229.0 10/15/2023   GLUCOSE 95 10/15/2023   CHOL 131 10/15/2023   TRIG 99.0 10/15/2023   HDL 45.80 10/15/2023   LDLDIRECT 49.0 12/05/2020   LDLCALC 65 10/15/2023   ALT 11 10/15/2023   AST 16 10/15/2023   NA 137 10/15/2023   K 3.9 10/15/2023   CL 104 10/15/2023   CREATININE 0.96 10/15/2023   BUN 19 10/15/2023   CO2 30 10/15/2023   TSH 5.54 (H) 10/15/2023   PSA 2.47 12/11/2022   HGBA1C 5.4 01/10/2016    US  Venous Img Lower Unilateral Left Result Date: 02/01/2022 CLINICAL DATA:  Left lower extremity pain EXAM: LEFT LOWER EXTREMITY VENOUS DOPPLER ULTRASOUND TECHNIQUE: Gray-scale sonography with compression, as well as color and duplex ultrasound, were performed to evaluate the deep venous system(s) from the level of the common femoral vein through the popliteal and proximal calf veins. COMPARISON:  None Available. FINDINGS: VENOUS Normal compressibility of the common  femoral, superficial femoral, and popliteal veins, as well as the visualized calf veins. Visualized portions of profunda femoral vein and great saphenous vein unremarkable. No filling defects to suggest DVT on grayscale or color Doppler imaging. Doppler waveforms show normal direction of venous flow, normal respiratory plasticity and response to augmentation. Limited views of the contralateral common femoral vein are unremarkable. OTHER None. Limitations: none IMPRESSION: Negative. Electronically Signed   By: Franky Crease M.D.   On: 02/01/2022 18:57   DG Knee 2 Views Left Result Date: 02/01/2022 CLINICAL DATA:  Left knee pain.  No history of trauma. EXAM: LEFT KNEE -  1-2 VIEW COMPARISON:  None available FINDINGS: Mild chronic enthesopathic change at the quadriceps insertion on the patella. Minimal medial compartment joint space narrowing. No acute fracture or dislocation. IMPRESSION: Minimal medial compartment osteoarthritis. Electronically Signed   By: Tanda Lyons M.D.   On: 02/01/2022 18:30    Assessment & Plan:   Problem List Items Addressed This Visit     B12 deficiency   Monthly B12 shots q 30 d      Hypothyroidism   Relevant Medications   QUEtiapine  (SEROQUEL ) 25 MG tablet   Memory loss    Age related dementia - worse Per Anne: agitation at night, wondering, looking for men in the house and outside Increase Seroquel  to 50 mg at hs w/caution  Potential benefits of a long term Seroque use as well as potential risks  and complications were explained to the patient and were aknowledged.       Relevant Medications   QUEtiapine  (SEROQUEL ) 25 MG tablet   Agitation due to dementia [facility]) - Primary   Age related dementia - worse Per Anne: agitation at night, wondering, looking for men in the house and outside Increase Seroquel  to 50 mg at hs w/caution  Potential benefits of a long term Seroque use as well as potential risks  and complications were explained to the patient and were  aknowledged.      Relevant Medications   QUEtiapine  (SEROQUEL ) 25 MG tablet   Chronic kidney disease   Check CMET         Meds ordered this encounter  Medications  . QUEtiapine  (SEROQUEL ) 25 MG tablet    Sig: Take 2 tablets (50 mg total) by mouth at bedtime.    Dispense:  180 tablet    Refill:  2      Follow-up: Return in about 6 months (around 08/06/2024) for a follow-up visit.  Marolyn Noel, MD

## 2024-02-04 NOTE — Assessment & Plan Note (Signed)
  Age related dementia - worse Per Arlean: agitation at night, wondering, looking for men in the house and outside Increase Seroquel  to 50 mg at hs w/caution  Potential benefits of a long term Seroque use as well as potential risks  and complications were explained to the patient and were aknowledged.

## 2024-02-04 NOTE — Assessment & Plan Note (Addendum)
 Monthly B12 shots q 30 d

## 2024-02-04 NOTE — Assessment & Plan Note (Signed)
Check CMET. 

## 2024-02-12 ENCOUNTER — Ambulatory Visit: Payer: Self-pay

## 2024-02-12 NOTE — Telephone Encounter (Signed)
 Whenever someone calls back to this residence--The patient's wife wanted it to be noted that if she says  This is not a good time it is because the patient is listening and she doesn't want him to hear   FYI Only or Action Required?: Action required by provider: request for appointment and update on patient condition.  Patient was last seen in primary care on 02/04/2024 by Plotnikov, Karlynn GAILS, MD.  Called Nurse Triage reporting Alzheimers Concerns.  Symptoms began years but worse lately.  Interventions attempted: Prescription medications: recent medication Seroquel --wife states this made patient worse.  Symptoms are: rapidly worsening.  Triage Disposition: Go to ED Now (or PCP Triage)  Patient/caregiver understands and will follow disposition?: No, wishes to speak with PCP                             Copied from CRM #[id number]. Topic: Clinical - Red Word Triage >> Feb 12, 2024  3:29 PM Macario HERO wrote: Red Word that prompted transfer to Nurse Triage: Patient stated her spouse has alzheimer's and his mood/behavior has changed and she is concerned. Reason for Disposition  Patient sounds very sick or weak to the triager  Answer Assessment - Initial Assessment Questions Whenever someone calls back to this residence--The patient's wife wanted it to be noted that if she says  This is not a good time it is because the patient is listening and she doesn't want him to hear   Wife called wanting an appointment Wife states that things get worse and patient stays up all night and roams the house Wife states that he is starting to have mobility issues She states that the patient fell about two weeks ago but he did not get seen for it and she states that he had torn a lot of skin at that time that has healed  Patient is very aggressive towards  Hallucinating for the last 3 months---wife states PCP knows this  She is advised that the patient needs to go to the Emergency  Room for safety  Wife is advised that she wants the patient to be seen by Dr Garald and he will not go to the Emergency Room She states the new medication made things worse She states her son doesn't get off work until 5am and the patient is sleeping in a chair at this time This RN recommended the ER multiple times but wife refused. She wanted patient to see Dr Garald either Thursday or Friday She is advised if things gets worse and she is scared for her or his safety to call 911 She verbalized understanding      1. MAIN CONCERN OR SYMPTOM:  What is your main concern right now? What questions do you have? What's the main symptom you're worried about? (e.g., confusion, memory loss)     Patient's agitation and talking bad to wife 2. ONSET:  When did the symptom start (or worsen)? (minutes, hours, days, weeks)     Wife states years ago but a lot worse lately 3. BETTER-SAME-WORSE: Are you (the patient) getting better, staying the same, or getting worse compared to the day you (they) were diagnosed or most recent hospital discharge?     Worse 4. DIAGNOSIS: Was the dementia diagnosed by a doctor? If Yes, ask: When? (e.g., days, months, years ago)     Alzheimers 5. MEDICINES: Has there been any change in medicines recently? (e.g., narcotics, antihistamines, benzodiazepines, etc.)  Seroquel     Wife states that this did not help at all 6. OTHER SYMPTOMS: Are there any other symptoms? (e.g., cough, falling, fever, pain)     Falling  7. SUPPORT: What type of support do you (the patient) have? Note: Document living circumstances and support (e.g., family, nursing home).     Wife and family  Protocols used: Dementia Symptoms and Questions-A-AH

## 2024-02-12 NOTE — Telephone Encounter (Signed)
 Called [facility] to advise them of patient's recommendation of ER and refusal at this time by wife.

## 2024-02-13 NOTE — Telephone Encounter (Signed)
 Please schedule office visit with me or any other available provider.  Thank you

## 2024-02-14 NOTE — Telephone Encounter (Signed)
 Tried reaching out to the pt and his wife as PCP is wanting pt to be seen by any provider ASAP due to changes in health.  Please schedule pt with next available appointment or have pt to go ED asap.

## 2024-02-24 ENCOUNTER — Encounter: Payer: Self-pay | Admitting: Internal Medicine

## 2024-02-27 ENCOUNTER — Encounter: Payer: Self-pay | Admitting: Internal Medicine

## 2024-02-27 ENCOUNTER — Ambulatory Visit: Admitting: Internal Medicine

## 2024-02-27 ENCOUNTER — Telehealth: Payer: Self-pay | Admitting: Internal Medicine

## 2024-02-27 VITALS — BP 120/70 | HR 72 | Temp 97.4°F | Ht 70.0 in | Wt 186.0 lb

## 2024-02-27 DIAGNOSIS — E559 Vitamin D deficiency, unspecified: Secondary | ICD-10-CM | POA: Diagnosis not present

## 2024-02-27 DIAGNOSIS — R5383 Other fatigue: Secondary | ICD-10-CM | POA: Diagnosis not present

## 2024-02-27 DIAGNOSIS — E039 Hypothyroidism, unspecified: Secondary | ICD-10-CM

## 2024-02-27 DIAGNOSIS — R413 Other amnesia: Secondary | ICD-10-CM | POA: Diagnosis not present

## 2024-02-27 DIAGNOSIS — F03911 Unspecified dementia, unspecified severity, with agitation: Secondary | ICD-10-CM | POA: Diagnosis not present

## 2024-02-27 DIAGNOSIS — D649 Anemia, unspecified: Secondary | ICD-10-CM

## 2024-02-27 DIAGNOSIS — E785 Hyperlipidemia, unspecified: Secondary | ICD-10-CM | POA: Diagnosis not present

## 2024-02-27 DIAGNOSIS — E538 Deficiency of other specified B group vitamins: Secondary | ICD-10-CM

## 2024-02-27 LAB — PSA: PSA: 3.5 ng/mL (ref 0.10–4.00)

## 2024-02-27 LAB — CBC WITH DIFFERENTIAL/PLATELET
Basophils Absolute: 0 K/uL (ref 0.0–0.1)
Basophils Relative: 0.7 % (ref 0.0–3.0)
Eosinophils Absolute: 0.2 K/uL (ref 0.0–0.7)
Eosinophils Relative: 3.1 % (ref 0.0–5.0)
HCT: 31.3 % — ABNORMAL LOW (ref 39.0–52.0)
Hemoglobin: 10.9 g/dL — ABNORMAL LOW (ref 13.0–17.0)
Lymphocytes Relative: 23.4 % (ref 12.0–46.0)
Lymphs Abs: 1.1 K/uL (ref 0.7–4.0)
MCHC: 34.7 g/dL (ref 30.0–36.0)
MCV: 104.8 fl — ABNORMAL HIGH (ref 78.0–100.0)
Monocytes Absolute: 0.5 K/uL (ref 0.1–1.0)
Monocytes Relative: 10.9 % (ref 3.0–12.0)
Neutro Abs: 3 K/uL (ref 1.4–7.7)
Neutrophils Relative %: 61.9 % (ref 43.0–77.0)
Platelets: 208 K/uL (ref 150.0–400.0)
RBC: 2.99 Mil/uL — ABNORMAL LOW (ref 4.22–5.81)
RDW: 13.8 % (ref 11.5–15.5)
WBC: 4.9 K/uL (ref 4.0–10.5)

## 2024-02-27 LAB — T4, FREE: Free T4: 0.78 ng/dL (ref 0.60–1.60)

## 2024-02-27 LAB — VITAMIN D 25 HYDROXY (VIT D DEFICIENCY, FRACTURES): VITD: 25.04 ng/mL — ABNORMAL LOW (ref 30.00–100.00)

## 2024-02-27 LAB — VITAMIN B12: Vitamin B-12: 237 pg/mL (ref 211–911)

## 2024-02-27 LAB — TSH: TSH: 4.66 u[IU]/mL (ref 0.35–5.50)

## 2024-02-27 NOTE — Assessment & Plan Note (Signed)
 Per Arlean: agitation at night, wondering, looking for men in the house and outside Increase Seroquel  to 50 mg at hs w/caution - it helps, pt is refusing it often...  Potential benefits of a long term Seroque use as well as potential risks  and complications were explained to the patient and were aknowledged. Treat vitamin B12 deficiency

## 2024-02-27 NOTE — Assessment & Plan Note (Signed)
 According to his wife Edger is much better when he takes Seroquel  25 mg 2 tablets at night, however he has been refusing to take it often.  Compliance was encouraged.

## 2024-02-27 NOTE — Telephone Encounter (Signed)
 Made in error

## 2024-02-27 NOTE — Assessment & Plan Note (Addendum)
 We can do monthly B12 shots q 30 d Another option would be to do kids vitamins that the patient would agree to take daily The importance of compliance was discussed.  I will prescribe a high-dose vitamin D  -she can give him 1 capsule every 2 weeks.  His vitamin B-12 is low -try to give him kids chewable multivitamin, i.e. Gummies daily.  If he is refusing-do B12 shots monthly.

## 2024-02-27 NOTE — Assessment & Plan Note (Signed)
 Fatigue should get better if water and takes Seroquel  at night and start to sleep better Also it is important to use vitamin B12

## 2024-02-27 NOTE — Assessment & Plan Note (Signed)
 On Levothyroxine

## 2024-02-27 NOTE — Progress Notes (Signed)
 Subjective:  Patient ID: Justin [name], male    DOB: Apr 23, 1925  Age: 89 y.o. MRN: [id number]  CC: Medical Management of Chronic Issues (Discuss possible medication for sleep, Pt also has concerns about his energy decreasing and hernia.)   HPI Justin [name] presents for dementia w/agitation, insomnia, fatigue, B12 def f/u Here w/his wife and son Justin [name] has not been taking his meds as directed   Outpatient Medications Prior to Visit  Medication Sig Dispense Refill   aspirin 81 MG tablet Take 81 mg by mouth daily.     finasteride  (PROSCAR ) 5 MG tablet Take 1 tablet (5 mg total) by mouth daily. 90 tablet 1   fluticasone  (FLONASE ) 50 MCG/ACT nasal spray Place 2 sprays into both nostrils daily. 16 g 6   QUEtiapine  (SEROQUEL ) 25 MG tablet Take 2 tablets (50 mg total) by mouth at bedtime. 180 tablet 2   sildenafil (VIAGRA) 100 MG tablet Take 100 mg by mouth.     simvastatin  (ZOCOR ) 20 MG tablet Take 1 tablet (20 mg total) by mouth daily. Pt needs office visit for further refills 90 tablet 1   Cholecalciferol (VITAMIN D3) 50 MCG (2000 UT) capsule Take 1 capsule (2,000 Units total) by mouth daily. 100 capsule 3   levocetirizine (XYZAL ) 5 MG tablet Take 1 tablet (5 mg total) by mouth every evening. (Patient not taking: Reported on 02/27/2024) 30 tablet 5   Syringe/Needle, Disp, (SYRINGE 3CC/25GX5/8) 25G X 5/8 3 ML MISC Use with b12 every 2 weeks (Patient not taking: Reported on 02/27/2024) 10 each 1   No facility-administered medications prior to visit.    ROS: Review of Systems  Constitutional:  Positive for fatigue. Negative for appetite change and unexpected weight change.  HENT:  Negative for congestion, nosebleeds, sneezing, sore throat and trouble swallowing.   Eyes:  Negative for itching and visual disturbance.  Respiratory:  Negative for cough.   Cardiovascular:  Negative for chest pain, palpitations and leg swelling.  Gastrointestinal:  Negative for abdominal distention,  blood in stool, diarrhea and nausea.  Genitourinary:  Negative for frequency and hematuria.  Musculoskeletal:  Positive for gait problem. Negative for back pain, joint swelling and neck pain.  Skin:  Negative for rash.  Neurological:  Positive for light-headedness. Negative for dizziness, tremors, speech difficulty and weakness.  Psychiatric/Behavioral:  Positive for agitation, behavioral problems, confusion, dysphoric mood and sleep disturbance. Negative for suicidal ideas. The patient is nervous/anxious.     Objective:  BP 120/70   Pulse 72   Temp (!) 97.4 F (36.3 C) (Oral)   Ht 5' 10 (1.778 m)   Wt 186 lb (84.4 kg)   SpO2 99%   BMI 26.69 kg/m   BP Readings from Last 3 Encounters:  02/27/24 120/70  02/04/24 110/68  10/15/23 116/68    Wt Readings from Last 3 Encounters:  02/27/24 186 lb (84.4 kg)  02/04/24 189 lb (85.7 kg)  10/15/23 186 lb 6.4 oz (84.6 kg)    Physical Exam Constitutional:      General: He is not in acute distress.    Appearance: He is well-developed.     Comments: NAD  Eyes:     Conjunctiva/sclera: Conjunctivae normal.     Pupils: Pupils are equal, round, and reactive to light.  Neck:     Thyroid : No thyromegaly.     Vascular: No JVD.  Cardiovascular:     Rate and Rhythm: Normal rate and regular rhythm.     Heart sounds: Normal heart sounds.  No murmur heard.    No friction rub. No gallop.  Pulmonary:     Effort: Pulmonary effort is normal. No respiratory distress.     Breath sounds: Normal breath sounds. No wheezing or rales.  Chest:     Chest wall: No tenderness.  Abdominal:     General: Bowel sounds are normal. There is no distension.     Palpations: Abdomen is soft. There is no mass.     Tenderness: There is no abdominal tenderness. There is no guarding or rebound.  Musculoskeletal:        General: No tenderness. Normal range of motion.     Cervical back: Normal range of motion.     Right lower leg: No edema.     Left lower leg: No  edema.  Lymphadenopathy:     Cervical: No cervical adenopathy.  Skin:    General: Skin is warm and dry.     Findings: No rash.  Neurological:     Mental Status: He is alert. Mental status is at baseline.     Cranial Nerves: No cranial nerve deficit.     Motor: No abnormal muscle tone.     Coordination: Coordination normal.     Gait: Gait abnormal.     Deep Tendon Reflexes: Reflexes are normal and symmetric.  Psychiatric:        Behavior: Behavior normal.        Thought Content: Thought content normal.   Arthritic gait, fairly steady Looks younger than his age he is alert and cooperative  Lab Results  Component Value Date   WBC 4.9 02/27/2024   HGB 10.9 (L) 02/27/2024   HCT 31.3 (L) 02/27/2024   PLT 208.0 02/27/2024   GLUCOSE 89 02/27/2024   CHOL 118 02/27/2024   TRIG 107.0 02/27/2024   HDL 42.80 02/27/2024   LDLDIRECT 49.0 12/05/2020   LDLCALC 54 02/27/2024   ALT 11 02/27/2024   AST 14 02/27/2024   NA 141 02/27/2024   K 3.6 02/27/2024   CL 103 02/27/2024   CREATININE 0.85 02/27/2024   BUN 16 02/27/2024   CO2 29 02/27/2024   TSH 4.66 02/27/2024   PSA 3.50 02/27/2024   HGBA1C 5.4 01/10/2016    US  Venous Img Lower Unilateral Left Result Date: 02/01/2022 CLINICAL DATA:  Left lower extremity pain EXAM: LEFT LOWER EXTREMITY VENOUS DOPPLER ULTRASOUND TECHNIQUE: Gray-scale sonography with compression, as well as color and duplex ultrasound, were performed to evaluate the deep venous system(s) from the level of the common femoral vein through the popliteal and proximal calf veins. COMPARISON:  None Available. FINDINGS: VENOUS Normal compressibility of the common femoral, superficial femoral, and popliteal veins, as well as the visualized calf veins. Visualized portions of profunda femoral vein and great saphenous vein unremarkable. No filling defects to suggest DVT on grayscale or color Doppler imaging. Doppler waveforms show normal direction of venous flow, normal respiratory  plasticity and response to augmentation. Limited views of the contralateral common femoral vein are unremarkable. OTHER None. Limitations: none IMPRESSION: Negative. Electronically Signed   By: Franky Crease M.D.   On: 02/01/2022 18:57   DG Knee 2 Views Left Result Date: 02/01/2022 CLINICAL DATA:  Left knee pain.  No history of trauma. EXAM: LEFT KNEE - 1-2 VIEW COMPARISON:  None available FINDINGS: Mild chronic enthesopathic change at the quadriceps insertion on the patella. Minimal medial compartment joint space narrowing. No acute fracture or dislocation. IMPRESSION: Minimal medial compartment osteoarthritis. Electronically Signed   By: Tanda Lyons  M.D.   On: 02/01/2022 18:30    Assessment & Plan:   Problem List Items Addressed This Visit     Agitation due to dementia [facility])   According to his wife Justin [name] is much better when he takes Seroquel  25 mg 2 tablets at night, however he has been refusing to take it often.  Compliance was encouraged.      Anemia   Relevant Orders   CBC with Differential/Platelet (Completed)   Comprehensive metabolic panel with GFR (Completed)   Lipid panel (Completed)   VITAMIN D  25 Hydroxy (Vit-D Deficiency, Fractures) (Completed)   B12 deficiency - Primary   We can do monthly B12 shots q 30 d Another option would be to do kids vitamins that the patient would agree to take daily The importance of compliance was discussed.  I will prescribe a high-dose vitamin D  -she can give him 1 capsule every 2 weeks.  His vitamin B-12 is low -try to give him kids chewable multivitamin, i.e. Gummies daily.  If he is refusing-do B12 shots monthly.        Relevant Orders   CBC with Differential/Platelet (Completed)   Comprehensive metabolic panel with GFR (Completed)   Lipid panel (Completed)   Vitamin B12 (Completed)   Fatigue   Fatigue should get better if water and takes Seroquel  at night and start to sleep better Also it is important to use vitamin B12       Hyperlipidemia   Relevant Orders   CBC with Differential/Platelet (Completed)   Comprehensive metabolic panel with GFR (Completed)   Lipid panel (Completed)   PSA (Completed)   Hypothyroidism   On Levothyroxine      Relevant Orders   TSH (Completed)   T4, free (Completed)   Memory loss   Per Anne: agitation at night, wondering, looking for men in the house and outside Increase Seroquel  to 50 mg at hs w/caution - it helps, pt is refusing it often...  Potential benefits of a long term Seroque use as well as potential risks  and complications were explained to the patient and were aknowledged. Treat vitamin B12 deficiency      Vitamin D  deficiency    I will prescribe a high-dose vitamin D  -she can give him 1 capsule every 2 weeks.        Relevant Orders   VITAMIN D  25 Hydroxy (Vit-D Deficiency, Fractures) (Completed)      No orders of the defined types were placed in this encounter.     Follow-up: Return in about 3 months (around 05/29/2024) for a follow-up visit.  Marolyn Noel, MD

## 2024-02-28 ENCOUNTER — Ambulatory Visit: Payer: Self-pay | Admitting: Internal Medicine

## 2024-02-28 DIAGNOSIS — E559 Vitamin D deficiency, unspecified: Secondary | ICD-10-CM | POA: Insufficient documentation

## 2024-02-28 LAB — COMPREHENSIVE METABOLIC PANEL WITH GFR
ALT: 11 U/L (ref 0–53)
AST: 14 U/L (ref 0–37)
Albumin: 3.7 g/dL (ref 3.5–5.2)
Alkaline Phosphatase: 70 U/L (ref 39–117)
BUN: 16 mg/dL (ref 6–23)
CO2: 29 meq/L (ref 19–32)
Calcium: 8.6 mg/dL (ref 8.4–10.5)
Chloride: 103 meq/L (ref 96–112)
Creatinine, Ser: 0.85 mg/dL (ref 0.40–1.50)
GFR: 73.24 mL/min (ref >60.00)
Glucose, Bld: 89 mg/dL (ref 70–99)
Potassium: 3.6 meq/L (ref 3.5–5.1)
Sodium: 141 meq/L (ref 135–145)
Total Bilirubin: 0.5 mg/dL (ref 0.2–1.2)
Total Protein: 8.6 g/dL — ABNORMAL HIGH (ref 6.0–8.3)

## 2024-02-28 LAB — LIPID PANEL
Cholesterol: 118 mg/dL (ref 0–200)
HDL: 42.8 mg/dL (ref >39.00)
LDL Cholesterol: 54 mg/dL (ref 0–99)
NonHDL: 75.19
Total CHOL/HDL Ratio: 3
Triglycerides: 107 mg/dL (ref 0.0–149.0)
VLDL: 21.4 mg/dL (ref 0.0–40.0)

## 2024-02-28 MED ORDER — VITAMIN D (ERGOCALCIFEROL) 1.25 MG (50000 UNIT) PO CAPS: 50000.0000 [IU] | ORAL_CAPSULE | ORAL | 5 refills | Status: AC

## 2024-02-28 NOTE — Assessment & Plan Note (Signed)
  I will prescribe a high-dose vitamin D  -she can give him 1 capsule every 2 weeks.

## 2024-02-28 NOTE — Addendum Note (Signed)
 Addended by: Lesly Pontarelli V on: 02/28/2024 11:12 AM   Modules accepted: Level of Service

## 2024-03-17 ENCOUNTER — Other Ambulatory Visit: Payer: Self-pay | Admitting: Internal Medicine

## 2024-03-17 MED ORDER — ALPRAZOLAM 0.25 MG PO TABS: 0.2500 mg | ORAL_TABLET | Freq: Two times a day (BID) | ORAL | 3 refills | Status: AC | PRN

## 2024-03-17 NOTE — Progress Notes (Signed)
 C/o agitation, insomnia, OCD at night  - Xanax  prn w/caution

## 2024-04-02 ENCOUNTER — Ambulatory Visit

## 2024-04-02 DIAGNOSIS — L570 Actinic keratosis: Secondary | ICD-10-CM | POA: Diagnosis not present

## 2024-04-02 DIAGNOSIS — L821 Other seborrheic keratosis: Secondary | ICD-10-CM | POA: Diagnosis not present

## 2024-04-02 DIAGNOSIS — D225 Melanocytic nevi of trunk: Secondary | ICD-10-CM | POA: Diagnosis not present

## 2024-04-24 ENCOUNTER — Ambulatory Visit: Payer: Self-pay

## 2024-04-24 NOTE — Telephone Encounter (Signed)
 FYI Only or Action Required?: Action required by provider: clinical question for provider.  Patient was last seen in primary care on 02/27/2024 by Plotnikov, Karlynn GAILS, MD.  Called Nurse Triage reporting Medication Problem.  Triage Disposition: Call PCP When Office is Open  Patient/caregiver understands and will follow disposition?: Yes  **See note below**      Copied from CRM #[id number]. Topic: Clinical - Red Word Triage >> Apr 24, 2024  2:39 PM Mercedes MATSU wrote: Red Word that prompted transfer to Nurse Triage: Patient called in stating that he was prescribed 2 different vitamin D  medications. He said he took both Vitamind D3 and Vitamin D2 and he has been feeling weird. He said his head feels weird and that he has a rattle in his chest. Patient wants to know what he should do. Reason for Disposition  [1] Caller has NON-URGENT medicine question about med that PCP prescribed AND [2] triager unable to answer question  Answer Assessment - Initial Assessment Questions 1. NAME of MEDICINE: What medicine(s) are you calling about?     D3 supplement and Vitamin D , Ergocalciferol , (DRISDOL ) 1.25 MG   4. SYMPTOMS: Do you have any symptoms? If Yes, ask: What symptoms are you having?  How bad are the symptoms (e.g., mild, moderate, severe)  Patient did not mention any symptoms once speaking to triage.     Patient called in stating he took Vitamin D , Ergocalciferol , (DRISDOL ) 1.25 MG and D3  supplement together. He stated the staff at the drug store told him not to take the D3 supplement within the 14 days of taking the Vitamin D , Ergocalciferol , (DRISDOL ) 1.25 MG. Patient was advised there are no contraindications. He would like some clarification from PCP.  Protocols used: Medication Question Call-A-AH

## 2024-05-14 ENCOUNTER — Ambulatory Visit (INDEPENDENT_AMBULATORY_CARE_PROVIDER_SITE_OTHER)

## 2024-05-14 VITALS — Ht 70.0 in | Wt 186.0 lb

## 2024-05-14 DIAGNOSIS — Z0000 Encounter for general adult medical examination without abnormal findings: Secondary | ICD-10-CM | POA: Diagnosis not present

## 2024-05-14 NOTE — Progress Notes (Cosign Needed Addendum)
 Subjective:   Justin [name] is a 89 y.o. who presents for a Medicare Wellness preventive visit.  As a reminder, Annual Wellness Visits don't include a physical exam, and some assessments may be limited, especially if this visit is performed virtually. We may recommend an in-person follow-up visit with your provider if needed.  Visit Complete: Virtual I connected with  Justin [name] on 05/14/24 by a audio enabled telemedicine application and verified that I am speaking with the correct person using two identifiers.  Patient Location: Home  Provider Location: Office/Clinic  I discussed the limitations of evaluation and management by telemedicine. The patient expressed understanding and agreed to proceed.  Vital Signs: Because this visit was a virtual/telehealth visit, some criteria may be missing or patient reported. Any vitals not documented were not able to be obtained and vitals that have been documented are patient reported.  VideoDeclined- This patient declined Librarian, academic. Therefore the visit was completed with audio only.  Persons Participating in Visit: Patient.  AWV Questionnaire: No: Patient Medicare AWV questionnaire was not completed prior to this visit.  Cardiac Risk Factors include: advanced age (>55men, >32 women);male gender;dyslipidemia;Other (see comment), Risk factor comments: BPH, Hx of TIA     Objective:    Today's Vitals   05/14/24 1042  Weight: 186 lb (84.4 kg)  Height: 5' 10 (1.778 m)   Body mass index is 26.69 kg/m.     05/14/2024   11:06 AM 03/21/2021   10:26 AM 11/23/2020    3:46 PM 08/16/2020    2:54 PM 02/09/2020   10:49 AM 07/28/2018    8:24 AM 06/15/2016   11:55 AM  Advanced Directives  Does Patient Have a Medical Advance Directive? No No No No No No  No   Would patient like information on creating a medical advance directive?  Yes (MAU/Ambulatory/Procedural Areas - Information given) No - Patient declined No  - Patient declined No - Patient declined No - Patient declined       Data saved with a previous flowsheet row definition    Current Medications (verified) Outpatient Encounter Medications as of 05/14/2024  Medication Sig   ALPRAZolam  (XANAX ) 0.25 MG tablet Take 1 tablet (0.25 mg total) by mouth 2 (two) times daily as needed for anxiety or sleep.   aspirin 81 MG tablet Take 81 mg by mouth daily.   finasteride  (PROSCAR ) 5 MG tablet Take 1 tablet (5 mg total) by mouth daily.   fluticasone  (FLONASE ) 50 MCG/ACT nasal spray Place 2 sprays into both nostrils daily.   QUEtiapine  (SEROQUEL ) 25 MG tablet Take 2 tablets (50 mg total) by mouth at bedtime.   sildenafil (VIAGRA) 100 MG tablet Take 100 mg by mouth.   simvastatin  (ZOCOR ) 20 MG tablet Take 1 tablet (20 mg total) by mouth daily. Pt needs office visit for further refills   Vitamin D , Ergocalciferol , (DRISDOL ) 1.25 MG (50000 UNIT) CAPS capsule Take 1 capsule (50,000 Units total) by mouth every 14 (fourteen) days.   levocetirizine (XYZAL ) 5 MG tablet Take 1 tablet (5 mg total) by mouth every evening. (Patient not taking: Reported on 05/14/2024)   Syringe/Needle, Disp, (SYRINGE 3CC/25GX5/8) 25G X 5/8 3 ML MISC Use with b12 every 2 weeks (Patient not taking: Reported on 05/14/2024)   No facility-administered encounter medications on file as of 05/14/2024.    Allergies (verified) Prednisone , Inderal  [propranolol ], and Omeprazole    History: Past Medical History:  Diagnosis Date   Anemia    Basal cell cancer  Dr Rolan Molt   Benign prostatic hypertrophy    Dr Janit   Carpal tunnel syndrome 10/19/2015   Right   Chronic kidney disease    Chronic kidney disease    Colon polyp    Diverticulosis    Hyperlipidemia    Kidney stones    Shingles    Stroke [facility])    Transient ischemic attack    Dr Rosemarie   Past Surgical History:  Procedure Laterality Date   CATARACT EXTRACTION, BILATERAL     COLONOSCOPY W/ POLYPECTOMY  01/2009    adenomatous, Dr. Myer Aho   HEMORRHOID SURGERY     LITHOTRIPSY      X 2;Dr. Janit   PROSTATE BIOPSY     X2   Family History  Problem Relation Age of Onset   Heart failure Father    Heart attack Father 20   Diabetes Mother    Colon cancer Mother    Prostate cancer Brother    Stroke Brother    Coronary artery disease Brother        7 vessel CBAG   Diabetes Brother    Heart attack Other        2 P uncles , both > 55   Stroke Brother        in late 70s   Colon polyps Sister    Heart attack Brother    Other Sister        brain hemorrhage due to a fall   Social History   Socioeconomic History   Marital status: Married    Spouse name: Ann   Number of children: 4   Years of education: Not on file   Highest education level: Not on file  Occupational History   Occupation: RETIRED/Truck Air Traffic Controller: [employer]  Tobacco Use   Smoking status: Former    Current packs/day: 0.00    Types: Cigarettes, Pipe, Cigars    Quit date: 07/23/1976    Years since quitting: 47.8   Smokeless tobacco: Former    Types: Chew   Tobacco comments:    smoked [phone], up to < 1 ppd  Vaping Use   Vaping status: Never Used  Substance and Sexual Activity   Alcohol use: No    Alcohol/week: 1.0 standard drink of alcohol    Types: 1 Glasses of wine per week    Comment: rare wine   Drug use: No   Sexual activity: Not on file  Other Topics Concern   Not on file  Social History Narrative   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, 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20      Social Drivers of Corporate Investment Banker Strain: Low Risk  (05/14/2024)   Overall Financial Resource Strain (CARDIA)    Difficulty of Paying Living Expenses: Not hard at all  Food Insecurity: No Food Insecurity (05/14/2024)   Hunger Vital Sign    Worried About Running Out of Food in the Last Year: Never true    Ran Out of Food in the Last Year: Never true   Transportation Needs: No Transportation Needs (05/14/2024)   PRAPARE - Administrator, Civil Service (Medical): No    Lack of Transportation (Non-Medical): No  Physical Activity: Inactive (05/14/2024)   Exercise Vital Sign    Days of Exercise per Week: 0 days    Minutes of Exercise per Session: 0 min  Stress:  No Stress Concern Present (05/14/2024)   [employer] of Occupational Health - Occupational Stress Questionnaire    Feeling of Stress: Not at all  Social Connections: Socially Integrated (05/14/2024)   Social Connection and Isolation Panel    Frequency of Communication with Friends and Family: Once a week    Frequency of Social Gatherings with Friends and Family: Three times a week    Attends Religious Services: More than 4 times per year    Active Member of Clubs or Organizations: Yes    Attends Banker Meetings: More than 4 times per year    Marital Status: Married    Tobacco Counseling Counseling given: Not Answered Tobacco comments: smoked [phone], up to < 1 ppd    Clinical Intake:  Pre-visit preparation completed: Yes  Pain : No/denies pain     BMI - recorded: 26.69 Nutritional Status: BMI 25 -29 Overweight Nutritional Risks: None Diabetes: No  Lab Results  Component Value Date   HGBA1C 5.4 01/10/2016     How often do you need to have someone help you when you read instructions, pamphlets, or other written materials from your doctor or pharmacy?: 1 - Never  Interpreter Needed?: No  Information entered by :: Shenandoah Yeats, RMA   Activities of Daily Living     05/14/2024   10:43 AM  In your present state of health, do you have any difficulty performing the following activities:  Hearing? 1  Comment Has hearing aides  Vision? 0  Difficulty concentrating or making decisions? 0  Walking or climbing stairs? 0  Dressing or bathing? 0  Doing errands, shopping? 0  Comment wife drives him  Preparing Food and eating ? N   Using the Toilet? N  In the past six months, have you accidently leaked urine? N  Do you have problems with loss of bowel control? N  Managing your Medications? N  Managing your Finances? N  Housekeeping or managing your Housekeeping? N    Patient Care Team: Plotnikov, Karlynn GAILS, MD as PCP - General (Internal Medicine) Janit Lamar PARAS, MD (Urology) Roz Anes, MD as Consulting Physician (Ophthalmology) Elnor Rome BROCKS, MD as Referring Physician (Dermatology)  I have updated your Care Teams any recent Medical Services you may have received from other providers in the past year.     Assessment:   This is a routine wellness examination for Justin [name].  Hearing/Vision screen Hearing Screening - Comments:: Has hearing aides Vision Screening - Comments:: Wears eyeglasses/[facility]/[place]/up to date   Goals Addressed   None    Depression Screen     05/14/2024   11:09 AM 02/04/2024   10:50 AM 12/11/2022    1:32 PM 01/18/2022   11:15 AM 03/21/2021   10:31 AM 02/09/2020   11:01 AM 07/28/2018    8:24 AM  PHQ 2/9 Scores  PHQ - 2 Score 0 1 0 0 0 0 0  PHQ- 9 Score 0          Fall Risk     05/14/2024   11:06 AM 02/04/2024   10:50 AM 10/15/2023    9:59 AM 12/11/2022    1:31 PM 01/18/2022   11:15 AM  Fall Risk   Falls in the past year? 0 1 0 0 0  Number falls in past yr: 0 1 0 0 0  Injury with Fall? 0 1 0 0 0  Risk for fall due to :  Impaired balance/gait No Fall Risks  Impaired balance/gait;Impaired mobility  Follow up Falls evaluation completed;Falls prevention discussed Falls evaluation completed Falls evaluation completed Falls evaluation completed Falls evaluation completed      Data saved with a previous flowsheet row definition    MEDICARE RISK AT HOME:  Medicare Risk at Home Any stairs in or around the home?: Yes If so, are there any without handrails?: No Home free of loose throw rugs in walkways, pet beds, electrical cords, etc?: Yes Adequate lighting in your home  to reduce risk of falls?: Yes Life alert?: No Use of a cane, walker or w/c?: No Grab bars in the bathroom?: Yes Shower chair or bench in shower?: Yes Elevated toilet seat or a handicapped toilet?: Yes  TIMED UP AND GO:  Was the test performed?  No  Cognitive Function: 6CIT completed    12/11/2022    2:10 PM 07/28/2018    8:25 AM  MMSE - Mini Mental State Exam  Orientation to time 4 5  Orientation to Place 4 5  Registration 3 3  Attention/ Calculation 5 5  Recall 1 2  Language- name 2 objects 2 2  Language- repeat 1 1  Language- follow 3 step command 3 3  Language- read & follow direction 1 1  Write a sentence 1 1  Copy design 0 1  Total score 25 29        05/14/2024   11:07 AM 02/09/2020   11:13 AM  6CIT Screen  What Year? 0 points 0 points  What month? 0 points 0 points  What time? 0 points 0 points  Count back from 20 0 points 0 points  Months in reverse 0 points 0 points  Repeat phrase 0 points 2 points  Total Score 0 points 2 points    Immunizations Immunization History  Administered Date(s) Administered   Fluad Quad(high Dose 65+) 05/22/2019, 05/19/2020, 05/19/2021, 06/13/2022   Fluad Trivalent(High Dose 65+) 07/03/2023   INFLUENZA, HIGH DOSE SEASONAL PF 05/25/2015, 06/08/2016, 04/26/2017, 05/28/2018   Influenza Split 05/25/2011, 05/23/2012   Influenza Whole 05/27/2007, 05/26/2008, 06/13/2009, 05/23/2010   Influenza, Seasonal, Injecte, Preservative Fre 05/25/2013   Influenza,inj,Quad PF,6+ Mos 05/10/2014   Influenza-Unspecified 06/07/2020   PFIZER(Purple Top)SARS-COV-2 Vaccination 08/13/2019, 09/03/2019, 06/21/2020, 07/12/2020, 07/26/2020   Td 08/13/2002   Tdap 12/03/2012    Screening Tests Health Maintenance  Topic Date Due   Pneumococcal Vaccine: 50+ Years (1 of 1 - PCV) Never done   Medicare Annual Wellness (AWV)  03/21/2022   DTaP/Tdap/Td (3 - Td or Tdap) 12/04/2022   Influenza Vaccine  02/21/2024   Meningococcal B Vaccine  Aged Out   COVID-19  Vaccine  Discontinued   Zoster Vaccines- Shingrix  Discontinued    Health Maintenance Items Addressed: Vaccines Due: Flu and Tdap, See Nurse Notes at the end of this note  Additional Screening:  Vision Screening: Recommended annual ophthalmology exams for early detection of glaucoma and other disorders of the eye. Is the patient up to date with their annual eye exam?  No  Who is the provider or what is the name of the office in which the patient attends annual eye exams? My eye doctor/[place]/patient not up to date  Dental Screening: Recommended annual dental exams for proper oral hygiene  Community Resource Referral / Chronic Care Management: CRR required this visit?  No   CCM required this visit?  No   Plan:    I have personally reviewed and noted the following in the patient's chart:   Medical and social history Use of alcohol, tobacco or  illicit drugs  Current medications and supplements including opioid prescriptions. Patient is not currently taking opioid prescriptions. Functional ability and status Nutritional status Physical activity Advanced directives List of other physicians Hospitalizations, surgeries, and ER visits in previous 12 months Vitals Screenings to include cognitive, depression, and falls Referrals and appointments  In addition, I have reviewed and discussed with patient certain preventive protocols, quality metrics, and best practice recommendations. A written personalized care plan for preventive services as well as general preventive health recommendations were provided to patient.   Narelle Schoening L Rosine Solecki, CMA   05/14/2024   After Visit Summary: (MyChart) Due to this being a telephonic visit, the after visit summary with patients personalized plan was offered to patient via MyChart   Notes: Patient is due for a flu vaccine and would like to get it during his up coming office visit.  He is also due for a Tdap.  Patient had no other concerns to  address today.  Medical screening examination/treatment/procedure(s) were performed by non-physician practitioner and as supervising physician I was immediately available for consultation/collaboration.  I agree with above. Karlynn Noel, MD

## 2024-05-14 NOTE — Telephone Encounter (Signed)
 Take vitamin D2 50,000 units every 2 weeks as prescribed.  Thanks

## 2024-05-14 NOTE — Patient Instructions (Signed)
 [name],  Thank you for taking the time for your Medicare Wellness Visit. I appreciate your continued commitment to your health goals. Please review the care plan we discussed, and feel free to reach out if I can assist you further.  Medicare recommends these wellness visits once per year to help you and your care team stay ahead of potential health issues. These visits are designed to focus on prevention, allowing your provider to concentrate on managing your acute and chronic conditions during your regular appointments.  Please note that Annual Wellness Visits do not include a physical exam. Some assessments may be limited, especially if the visit was conducted virtually. If needed, we may recommend a separate in-person follow-up with your provider.  Ongoing Care Seeing your primary care provider every 3 to 6 months helps us  monitor your health and provide consistent, personalized care. Next office visit is due on 06/04/2024.  Remember to get your flu shot during your up coming office visit.  You are also due for a tetanus vaccine and can get that done at your pharmacy.  Keep up the good work.  Referrals If a referral was made during today's visit and you haven't received any updates within two weeks, please contact the referred provider directly to check on the status.  Recommended Screenings:  Health Maintenance  Topic Date Due   Pneumococcal Vaccine for age over 88 (1 of 1 - PCV) Never done   Medicare Annual Wellness Visit  03/21/2022   DTaP/Tdap/Td vaccine (3 - Td or Tdap) 12/04/2022   Flu Shot  02/21/2024   Meningitis B Vaccine  Aged Out   COVID-19 Vaccine  Discontinued   Zoster (Shingles) Vaccine  Discontinued       05/14/2024   11:06 AM  Advanced Directives  Does Patient Have a Medical Advance Directive? No   Advance Care Planning is important because it: Ensures you receive medical care that aligns with your values, goals, and preferences. Provides guidance to your  family and loved ones, reducing the emotional burden of decision-making during critical moments.  Vision: Annual vision screenings are recommended for early detection of glaucoma, cataracts, and diabetic retinopathy. These exams can also reveal signs of chronic conditions such as diabetes and high blood pressure.  Dental: Annual dental screenings help detect early signs of oral cancer, gum disease, and other conditions linked to overall health, including heart disease and diabetes.  Please see the attached documents for additional preventive care recommendations.

## 2024-05-28 ENCOUNTER — Ambulatory Visit

## 2024-06-04 ENCOUNTER — Ambulatory Visit: Admitting: Internal Medicine

## 2024-06-24 ENCOUNTER — Ambulatory Visit

## 2024-06-24 DIAGNOSIS — Z23 Encounter for immunization: Secondary | ICD-10-CM | POA: Diagnosis not present

## 2024-06-24 NOTE — Progress Notes (Addendum)
 High Dose flu given w/o complications  Medical screening examination/treatment/procedure(s) were performed by non-physician practitioner and as supervising physician I was immediately available for consultation/collaboration.  I agree with above. Karlynn Noel, MD

## 2024-06-25 DIAGNOSIS — L233 Allergic contact dermatitis due to drugs in contact with skin: Secondary | ICD-10-CM | POA: Diagnosis not present

## 2024-06-25 DIAGNOSIS — L218 Other seborrheic dermatitis: Secondary | ICD-10-CM | POA: Diagnosis not present

## 2024-07-07 ENCOUNTER — Other Ambulatory Visit: Payer: Self-pay | Admitting: Family Medicine

## 2024-07-07 DIAGNOSIS — N138 Other obstructive and reflux uropathy: Secondary | ICD-10-CM

## 2024-07-13 ENCOUNTER — Other Ambulatory Visit: Payer: Self-pay | Admitting: Internal Medicine

## 2024-07-13 DIAGNOSIS — N138 Other obstructive and reflux uropathy: Secondary | ICD-10-CM

## 2024-07-13 NOTE — Telephone Encounter (Unsigned)
 Copied from CRM #[id number]. Topic: Clinical - Medication Refill >> Jul 13, 2024  9:56 AM Justin [name] wrote: Medication: Finasteride  finasteride  (PROSCAR ) 5 MG tablet  Has the patient contacted their pharmacy? Yes (Agent: If no, request that the patient contact the pharmacy for the refill. If patient does not wish to contact the pharmacy document the reason why and proceed with request.) (Agent: If yes, when and what did the pharmacy advise?)  This is the patient's preferred pharmacy:  [id number] - [place], [place] - [address] [place] [address] [place] [address] [place] [address] Phone: [phone] Fax: [phone]  Is this the correct pharmacy for this prescription? Yes If no, delete pharmacy and type the correct one.   Has the prescription been filled recently? Yes  Is the patient out of the medication? No  Has the patient been seen for an appointment in the last year OR does the patient have an upcoming appointment? Yes  Can we respond through MyChart? Yes  Agent: Please be advised that Rx refills may take up to 3 business days. We ask that you follow-up with your pharmacy.

## 2024-07-14 MED ORDER — FINASTERIDE 5 MG PO TABS: 5.0000 mg | ORAL_TABLET | Freq: Every day | ORAL | 1 refills | Status: AC

## 2024-07-14 NOTE — Telephone Encounter (Signed)
 Pt's wife, Jenkins, LVM last night at 6 pm, with coumadin clinic that pt was out of finasteride .  Review of chart reveals script was refilled this morning by CMA.  Tried to contact Jenkins to advise but number kept saying we're sorry, your call cannot be completed at this time, please hang up and try again later.

## 2024-08-08 ENCOUNTER — Emergency Department (HOSPITAL_BASED_OUTPATIENT_CLINIC_OR_DEPARTMENT_OTHER)
Admission: EM | Admit: 2024-08-08 | Discharge: 2024-08-08 | Disposition: A | Discharge status: Home / Self Care | Attending: Emergency Medicine | Admitting: Emergency Medicine

## 2024-08-08 ENCOUNTER — Emergency Department (HOSPITAL_BASED_OUTPATIENT_CLINIC_OR_DEPARTMENT_OTHER)

## 2024-08-08 ENCOUNTER — Other Ambulatory Visit: Payer: Self-pay

## 2024-08-08 ENCOUNTER — Encounter (HOSPITAL_BASED_OUTPATIENT_CLINIC_OR_DEPARTMENT_OTHER): Payer: Self-pay | Admitting: Emergency Medicine

## 2024-08-08 DIAGNOSIS — Z23 Encounter for immunization: Secondary | ICD-10-CM | POA: Diagnosis not present

## 2024-08-08 DIAGNOSIS — Z85828 Personal history of other malignant neoplasm of skin: Secondary | ICD-10-CM | POA: Insufficient documentation

## 2024-08-08 DIAGNOSIS — Z7982 Long term (current) use of aspirin: Secondary | ICD-10-CM | POA: Diagnosis not present

## 2024-08-08 DIAGNOSIS — Y92002 Bathroom of unspecified non-institutional (private) residence single-family (private) house as the place of occurrence of the external cause: Secondary | ICD-10-CM | POA: Insufficient documentation

## 2024-08-08 DIAGNOSIS — Z87891 Personal history of nicotine dependence: Secondary | ICD-10-CM | POA: Diagnosis not present

## 2024-08-08 DIAGNOSIS — S51012A Laceration without foreign body of left elbow, initial encounter: Secondary | ICD-10-CM | POA: Insufficient documentation

## 2024-08-08 DIAGNOSIS — Z87442 Personal history of urinary calculi: Secondary | ICD-10-CM | POA: Insufficient documentation

## 2024-08-08 DIAGNOSIS — N189 Chronic kidney disease, unspecified: Secondary | ICD-10-CM | POA: Diagnosis not present

## 2024-08-08 DIAGNOSIS — D631 Anemia in chronic kidney disease: Secondary | ICD-10-CM | POA: Diagnosis not present

## 2024-08-08 DIAGNOSIS — Z8673 Personal history of transient ischemic attack (TIA), and cerebral infarction without residual deficits: Secondary | ICD-10-CM | POA: Diagnosis not present

## 2024-08-08 DIAGNOSIS — W01198A Fall on same level from slipping, tripping and stumbling with subsequent striking against other object, initial encounter: Secondary | ICD-10-CM | POA: Insufficient documentation

## 2024-08-08 DIAGNOSIS — I6782 Cerebral ischemia: Secondary | ICD-10-CM | POA: Insufficient documentation

## 2024-08-08 DIAGNOSIS — K449 Diaphragmatic hernia without obstruction or gangrene: Secondary | ICD-10-CM | POA: Diagnosis not present

## 2024-08-08 DIAGNOSIS — S42018A Nondisplaced fracture of sternal end of left clavicle, initial encounter for closed fracture: Secondary | ICD-10-CM | POA: Insufficient documentation

## 2024-08-08 DIAGNOSIS — S4992XA Unspecified injury of left shoulder and upper arm, initial encounter: Secondary | ICD-10-CM | POA: Diagnosis present

## 2024-08-08 DIAGNOSIS — E039 Hypothyroidism, unspecified: Secondary | ICD-10-CM | POA: Insufficient documentation

## 2024-08-08 DIAGNOSIS — W19XXXA Unspecified fall, initial encounter: Secondary | ICD-10-CM

## 2024-08-08 DIAGNOSIS — N200 Calculus of kidney: Secondary | ICD-10-CM | POA: Diagnosis not present

## 2024-08-08 LAB — BASIC METABOLIC PANEL WITH GFR
Anion gap: 7 (ref 5–15)
BUN: 16 mg/dL (ref 8–23)
CO2: 27 mmol/L (ref 22–32)
Calcium: 8.7 mg/dL — ABNORMAL LOW (ref 8.9–10.3)
Chloride: 104 mmol/L (ref 98–111)
Creatinine, Ser: 0.79 mg/dL (ref 0.61–1.24)
GFR, Estimated: >60 mL/min
Glucose, Bld: 125 mg/dL — ABNORMAL HIGH (ref 70–99)
Potassium: 3.7 mmol/L (ref 3.5–5.1)
Sodium: 138 mmol/L (ref 135–145)

## 2024-08-08 LAB — CBC WITH DIFFERENTIAL/PLATELET
Abs Immature Granulocytes: 0.02 K/uL (ref 0.00–0.07)
Basophils Absolute: 0 K/uL (ref 0.0–0.1)
Basophils Relative: 0 %
Eosinophils Absolute: 0 K/uL (ref 0.0–0.5)
Eosinophils Relative: 0 %
HCT: 30.4 % — ABNORMAL LOW (ref 39.0–52.0)
Hemoglobin: 10.7 g/dL — ABNORMAL LOW (ref 13.0–17.0)
Immature Granulocytes: 0 %
Lymphocytes Relative: 8 %
Lymphs Abs: 0.7 K/uL (ref 0.7–4.0)
MCH: 36 pg — ABNORMAL HIGH (ref 26.0–34.0)
MCHC: 35.2 g/dL (ref 30.0–36.0)
MCV: 102.4 fL — ABNORMAL HIGH (ref 80.0–100.0)
Monocytes Absolute: 0.7 K/uL (ref 0.1–1.0)
Monocytes Relative: 9 %
Neutro Abs: 6.6 K/uL (ref 1.7–7.7)
Neutrophils Relative %: 83 %
Platelets: 198 K/uL (ref 150–400)
RBC: 2.97 MIL/uL — ABNORMAL LOW (ref 4.22–5.81)
RDW: 13.4 % (ref 11.5–15.5)
WBC: 8 K/uL (ref 4.0–10.5)
nRBC: 0 % (ref 0.0–0.2)

## 2024-08-08 MED ORDER — IOHEXOL 300 MG/ML  SOLN
100.0000 mL | Freq: Once | INTRAMUSCULAR | Status: AC | PRN
  Administered 2024-08-08: 100 mL via INTRAVENOUS

## 2024-08-08 MED ORDER — TETANUS-DIPHTH-ACELL PERTUSSIS 5-2-15.5 LF-MCG/0.5 IM SUSP
0.5000 mL | Freq: Once | INTRAMUSCULAR | Status: AC
  Administered 2024-08-08: 0.5 mL via INTRAMUSCULAR
  Filled 2024-08-08: qty 0.5

## 2024-08-08 MED ORDER — ACETAMINOPHEN 500 MG PO TABS
1000.0000 mg | ORAL_TABLET | Freq: Once | ORAL | Status: AC
  Administered 2024-08-08: 1000 mg via ORAL
  Filled 2024-08-08: qty 2

## 2024-08-08 NOTE — ED Notes (Signed)
 Patient transported to CT

## 2024-08-08 NOTE — ED Provider Notes (Signed)
 "  EMERGENCY DEPARTMENT AT [facility] Provider Note  CSN: [id number] Arrival date & time: 08/08/24 9248  Chief Complaint(s) Fall  HPI Justin [name] is a 89 y.o. male who is here today after a fall this morning.  He slipped on a rug in the bathroom at about 3 AM.  Struck his right side of his chest on the bathtub, has pain in his left wrist, back of the head.  Did strike his head, no LOC, not on blood thinners.  Has been ambulatory since.  He is here today with his wife and son who provide history.  Endorsing pain in left elbow, left wrist, right side of chest, abdomen and head.   Past Medical History Past Medical History:  Diagnosis Date   Anemia    Basal cell cancer    Dr Rolan Molt   Benign prostatic hypertrophy    Dr Janit   Carpal tunnel syndrome 10/19/2015   Right   Chronic kidney disease    Chronic kidney disease    Colon polyp    Diverticulosis    Hyperlipidemia    Kidney stones    Shingles    Stroke [facility])    Transient ischemic attack    Dr Rosemarie   Patient Active Problem List   Diagnosis Date Noted   Vitamin D  deficiency 02/28/2024   Fatigue 02/27/2024   Chronic kidney disease    Agitation due to dementia (HCC) 10/15/2023   Memory loss 12/11/2022   Nocturia 12/11/2022   Degenerative arthritis of left knee 02/05/2022   Bronchitis 11/17/2021   Lumbar radiculopathy 08/02/2021   Dysuria 04/21/2020   Left hip pain 04/21/2020   Acute pain of right knee 10/01/2019   Acute midline low back pain without sciatica 05/26/2019   Foreign body in left ear 01/20/2019   Dizziness 04/26/2017   Dehydration 04/26/2017   Allergic reaction caused by a drug 04/03/2017   Preventative health care 10/27/2016   Left flank pain 04/23/2016   Hypothyroidism 10/25/2015   Carpal tunnel syndrome 10/19/2015   Pain, joint, hand, right 08/22/2015   Family history of prostate cancer 12/10/2013   Kidney stone 12/10/2013   B12 deficiency 12/31/2011   Benign prostatic  hyperplasia with urinary obstruction 09/25/2011   SKIN CANCER, HX OF 03/31/2010   Diverticulosis of colon 05/10/2008   Hx of TIA (transient ischemic attack) and stroke 05/10/2008   History of colonic polyps 05/10/2008   NEPHROLITHIASIS, HX OF 05/10/2008   Hyperlipidemia 09/24/2007   Anemia 12/25/2006   Home Medication(s) Prior to Admission medications  Medication Sig Start Date End Date Taking? Authorizing Provider  ALPRAZolam  (XANAX ) 0.25 MG tablet Take 1 tablet (0.25 mg total) by mouth 2 (two) times daily as needed for anxiety or sleep. 03/17/24   Plotnikov, Aleksei V, MD  aspirin 81 MG tablet Take 81 mg by mouth daily.    [provider]  finasteride  (PROSCAR ) 5 MG tablet Take 1 tablet (5 mg total) by mouth daily. 07/14/24   Plotnikov, Aleksei V, MD  fluticasone  (FLONASE ) 50 MCG/ACT nasal spray Place 2 sprays into both nostrils daily. 11/17/21   Antonio Cyndee Rockers R, DO  levocetirizine (XYZAL ) 5 MG tablet Take 1 tablet (5 mg total) by mouth every evening. Patient not taking: Reported on 05/14/2024 11/17/21   Antonio Cyndee, Rockers SAUNDERS, DO  QUEtiapine  (SEROQUEL ) 25 MG tablet Take 2 tablets (50 mg total) by mouth at bedtime. 02/04/24   Plotnikov, Aleksei V, MD  sildenafil (VIAGRA) 100 MG tablet Take  100 mg by mouth. 03/06/22   [provider]  simvastatin  (ZOCOR ) 20 MG tablet Take 1 tablet (20 mg total) by mouth daily. Pt needs office visit for further refills 12/17/23   Antonio Meth, Jamee SAUNDERS, DO  Syringe/Needle, Disp, (SYRINGE 3CC/25GX5/8) 25G X 5/8 3 ML MISC Use with b12 every 2 weeks Patient not taking: Reported on 05/14/2024 11/17/21   Antonio Meth Jamee SAUNDERS, DO  Vitamin D , Ergocalciferol , (DRISDOL ) 1.25 MG (50000 UNIT) CAPS capsule Take 1 capsule (50,000 Units total) by mouth every 14 (fourteen) days. 02/28/24   Plotnikov, Karlynn GAILS, MD                                                                                                                                    Past Surgical  History Past Surgical History:  Procedure Laterality Date   CATARACT EXTRACTION, BILATERAL     COLONOSCOPY W/ POLYPECTOMY  01/2009   adenomatous, Dr. Myer Aho   HEMORRHOID SURGERY     LITHOTRIPSY      X 2;Dr. Janit   PROSTATE BIOPSY     X2   Family History Family History  Problem Relation Age of Onset   Heart failure Father    Heart attack Father 63   Diabetes Mother    Colon cancer Mother    Prostate cancer Brother    Stroke Brother    Coronary artery disease Brother        7 vessel CBAG   Diabetes Brother    Heart attack Other        2 P uncles , both > 55   Stroke Brother        in late 70s   Colon polyps Sister    Heart attack Brother    Other Sister        brain hemorrhage due to a fall    Social History Social History[1] Allergies Prednisone , Inderal  [propranolol ], and Omeprazole   Review of Systems Review of Systems  Physical Exam Vital Signs  I have reviewed the triage vital signs BP (!) 144/77 (BP Location: Right Arm)   Pulse 91   Temp 97.6 F (36.4 C) (Oral)   Resp 20   SpO2 95%   Physical Exam Vitals and nursing note reviewed.  Constitutional:      Appearance: He is not toxic-appearing.  HENT:     Head: Normocephalic.  Eyes:     Extraocular Movements: Extraocular movements intact.     Pupils: Pupils are equal, round, and reactive to light.  Cardiovascular:     Rate and Rhythm: Normal rate.  Pulmonary:     Effort: Pulmonary effort is normal.  Abdominal:     General: Abdomen is flat. There is no distension.     Tenderness: There is abdominal tenderness. There is no guarding.  Musculoskeletal:     Cervical back: Normal range of motion. No rigidity.     Comments: Left  elbow-skin tear on the left elbow.  No deformity.  No deformity of left wrist, patient with tenderness to palpation of left wrist.  No scaphoid tenderness.  Patient with no tenderness in the bilateral upper extremities, tenderness or deformity in the right arm.  He he has no  tenderness in his hips, pelvis, is able to lift both legs from the bed.  Skin:    Comments: Left elbow skin tear  Neurological:     General: No focal deficit present.     Mental Status: He is alert.     Cranial Nerves: No cranial nerve deficit.     Sensory: No sensory deficit.     Motor: No weakness.     ED Results and Treatments Labs (all labs ordered are listed, but only abnormal results are displayed) Labs Reviewed  BASIC METABOLIC PANEL WITH GFR - Abnormal; Notable for the following components:      Result Value   Glucose, Bld 125 (*)    Calcium 8.7 (*)    All other components within normal limits  CBC WITH DIFFERENTIAL/PLATELET - Abnormal; Notable for the following components:   RBC 2.97 (*)    Hemoglobin 10.7 (*)    HCT 30.4 (*)    MCV 102.4 (*)    MCH 36.0 (*)    All other components within normal limits                                                                                                                          Radiology DG Elbow Complete Left Result Date: 08/08/2024 EXAM: 3 VIEW(S) XRAY OF THE LEFT ELBOW COMPARISON: None available. CLINICAL HISTORY: Trauma Trauma FINDINGS: BONES AND JOINTS: No acute fracture. No malalignment. SOFT TISSUES: Unremarkable. IMPRESSION: 1. No significant abnormality. Electronically signed by: Evalene Coho MD 08/08/2024 10:37 AM EST RP Workstation: [id number]   DG Wrist Complete Left Result Date: 08/08/2024 EXAM: 3 or more view(s) Xray of the left wrist 08/08/2024 09:59:40 AM COMPARISON: None available. CLINICAL HISTORY: Trauma trauma trauma trauma FINDINGS: BONES AND JOINTS: No acute fracture. No malalignment. SOFT TISSUES: Unremarkable. IMPRESSION: 1. No significant abnormality. Electronically signed by: Evalene Coho MD 08/08/2024 10:36 AM EST RP Workstation: [id number]   CT CHEST ABDOMEN PELVIS W CONTRAST Result Date: 08/08/2024 EXAM: CT CHEST, ABDOMEN AND PELVIS WITH CONTRAST 08/08/2024 10:00:00 AM TECHNIQUE: CT of the  chest, abdomen and pelvis was performed with the administration of 100 mL iohexol  (OMNIPAQUE ) 300 MG/ML solution. Multiplanar reformatted images are provided for review. Automated exposure control, iterative reconstruction, and/or weight based adjustment of the mA/kV was utilized to reduce the radiation dose to as low as reasonably achievable. COMPARISON: CT of the abdomen and pelvis dated 03/15/2017. CLINICAL HISTORY: Polytrauma, blunt. FINDINGS: CHEST: MEDIASTINUM AND LYMPH NODES: Heart is mildly enlarged. The central airways are clear. The thoracic esophagus is distended with air and ingested debris. There is a mild hiatus hernia. No mediastinal, hilar or axillary lymphadenopathy. LUNGS AND PLEURA: Ground glass opacities. There is  a very irregular reticulation of the lung periphery. No focal consolidation or pulmonary edema. No pleural effusion. No pneumothorax. ABDOMEN AND PELVIS: LIVER: Unremarkable. GALLBLADDER AND BILE DUCTS: Unremarkable. No biliary ductal dilatation. SPLEEN: No acute abnormality. PANCREAS: No acute abnormality. ADRENAL GLANDS: No acute abnormality. KIDNEYS, URETERS AND BLADDER: There is a right pelvic kidney again demonstrated, which contains an ovoid calculus measuring approximately 11 mm in length. There are a couple of 2 to 3 mm nonobstructive calculi also present within the upper pole of the left kidney, in addition to a small cyst. There is no evidence of obstructive uropathy. No perinephric or periureteral stranding. Urinary bladder is unremarkable. GI AND BOWEL: Stomach demonstrates no acute abnormality. There are numerous colonic diverticula, but there is no evidence of diverticulitis. The remainder of the bowel including the appendix is unremarkable. There is no bowel obstruction. REPRODUCTIVE ORGANS: The prostate gland is mildly prominent. No acute abnormality. PERITONEUM AND RETROPERITONEUM: No ascites. No free air. VASCULATURE: Aorta is normal in caliber. ABDOMINAL AND PELVIS  LYMPH NODES: No lymphadenopathy. BONES AND SOFT TISSUES: There is a nondisplaced acute fracture of the medial aspect of the left clavicle, which is seen on image 13 of series 4 and image 108 of coronal series 7. The bones are otherwise intact. There is mild-to-moderate scoliosis of the thoracolumbar spine. There is extensive degenerative disc disease also present throughout the thoracolumbar spine. There is a left inguinal fat-containing hernia. No focal soft tissue abnormality. IMPRESSION: 1. Nondisplaced acute fracture of the medial aspect of the left clavicle. 2. Peripheral reticulation and scattered ground-glass opacities in the lungs. 3. Right pelvic kidney with an ovoid calculus measuring approximately 11 mm in length; additional nonobstructive left renal calculi. No evidence of obstructive uropathy. 4. Mild hiatal hernia with distended thoracic esophagus containing air and ingested debris. 5. Numerous colonic diverticula without evidence of diverticulitis. 6. Left inguinal fat-containing hernia. Electronically signed by: Evalene Coho MD 08/08/2024 10:36 AM EST RP Workstation: [id number]   CT Cervical Spine Wo Contrast Result Date: 08/08/2024 EXAM: CT CERVICAL SPINE WITHOUT CONTRAST 08/08/2024 10:00:00 AM TECHNIQUE: CT of the cervical spine was performed without the administration of intravenous contrast. Multiplanar reformatted images are provided for review. Automated exposure control, iterative reconstruction, and/or weight based adjustment of the mA/kV was utilized to reduce the radiation dose to as low as reasonably achievable. COMPARISON: None available. CLINICAL HISTORY: Ataxia, cervical trauma. FINDINGS: BONES AND ALIGNMENT: There is no evidence of fracture or acute traumatic injury. There is slight degenerative anterolisthesis present at C3-C4 and C4-C5. There is also slight degenerative retrolisthesis at C5-C6. DEGENERATIVE CHANGES: There is moderate right-sided facet arthrosis at C2-C3 and  C3-C4. SOFT TISSUES: No prevertebral soft tissue swelling. There is air and ingested debris present within the upper thoracic esophagus, which is mildly distended, suggesting esophageal dysmotility or gastroesophageal reflux. IMPRESSION: 1. No evidence of fracture or acute traumatic injury. 2. Slight degenerative anterolisthesis at C3-4 and C4-5, and slight degenerative retrolisthesis at C5-6. 3. Moderate right-sided facet arthrosis at C2-3 and C3-4. 4. Air and ingested debris within the mildly distended upper thoracic esophagus, suggesting esophageal dysmotility or gastroesophageal reflux. Electronically signed by: Evalene Coho MD 08/08/2024 10:26 AM EST RP Workstation: [id number]   CT Head Wo Contrast Result Date: 08/08/2024 EXAM: CT HEAD WITHOUT CONTRAST 08/08/2024 10:00:00 AM TECHNIQUE: CT of the head was performed without the administration of intravenous contrast. Automated exposure control, iterative reconstruction, and/or weight based adjustment of the mA/kV was utilized to reduce the radiation dose to as low  as reasonably achievable. COMPARISON: CT of the head dated 11/23/2020. CLINICAL HISTORY: Ataxia, head trauma. FINDINGS: BRAIN AND VENTRICLES: Moderate parenchymal volume loss. Periventricular and subcortical white matter hypoattenuation, consistent with moderate chronic ischemic microvascular disease. Calcified atherosclerotic plaque within cavernous/supraclinoid internal carotid arteries. No acute hemorrhage. No evidence of acute infarct. No hydrocephalus. No extra-axial collection. No mass effect or midline shift. ORBITS: The patient is status post bilateral lens replacement. No acute abnormality. SINUSES: There is mild mucosal disease within the maxillary sinuses bilaterally. SOFT TISSUES AND SKULL: There is debris/cerumen present within the external auditory canals bilaterally. No acute soft tissue abnormality. No skull fracture. IMPRESSION: 1. No acute intracranial abnormality related to the  head trauma. 2. Moderate parenchymal volume loss and moderate chronic ischemic microvascular disease. 3. Calcified atherosclerotic plaque within the cavernous/supraclinoid internal carotid arteries and mild bilateral maxillary sinus mucosal disease. Electronically signed by: Evalene Coho MD 08/08/2024 10:21 AM EST RP Workstation: [id number]    Pertinent labs & imaging results that were available during my care of the patient were reviewed by me and considered in my medical decision making (see MDM for details).  Medications Ordered in ED Medications  Tdap (ADACEL ) injection 0.5 mL (has no administration in time range)  acetaminophen  (TYLENOL ) tablet 1,000 mg (1,000 mg Oral Given 08/08/24 0846)  iohexol  (OMNIPAQUE ) 300 MG/ML solution 100 mL (100 mLs Intravenous Contrast Given 08/08/24 0957)                                                                                                                                     Procedures Procedures  (including critical care time)  Medical Decision Making / ED Course   This patient presents to the ED for concern of fall, this involves an extensive number of treatment options, and is a complaint that carries with it a high risk of complications and morbidity.  The differential diagnosis includes intracranial hemorrhage, rib fracture, thoracic hemorrhage, intra-abdominal hemorrhage, liver laceration, splenic laceration.  Elbow fracture, wrist fracture.  MDM: Given the patient's age, fall and areas of tenderness, will obtain CT imaging the patient's head, neck, chest abdomen pelvis.  Blood work ordered.  Will obtain plain films of the patient's left wrist and left elbow.  Reassessment 10:45 AM-patient CT imaging negative aside from nondisplaced left clavicle fracture.  Sling ordered.  Will provide orthopedic follow-up.  Discussed findings with patient and family at bedside.  Patient ambulatory, has good assistance at home.  Patient behind on his  tetanus.  Ordered here today.  Additional history obtained: -Additional history obtained from family at bedside -External records from outside source obtained and reviewed including: Chart review including previous notes, labs, imaging, consultation notes   Lab Tests: -I ordered, reviewed, and interpreted labs.   The pertinent results include:   Labs Reviewed  BASIC METABOLIC PANEL WITH GFR - Abnormal; Notable for the following components:      Result Value  Glucose, Bld 125 (*)    Calcium 8.7 (*)    All other components within normal limits  CBC WITH DIFFERENTIAL/PLATELET - Abnormal; Notable for the following components:   RBC 2.97 (*)    Hemoglobin 10.7 (*)    HCT 30.4 (*)    MCV 102.4 (*)    MCH 36.0 (*)    All other components within normal limits      Imaging Studies ordered: I ordered imaging studies including CT chest abdomen pelvis, CT head, CT cervical spine and plain films I independently visualized and interpreted imaging. I agree with the radiologist interpretation   Medicines ordered and prescription drug management: Meds ordered this encounter  Medications   acetaminophen  (TYLENOL ) tablet 1,000 mg   iohexol  (OMNIPAQUE ) 300 MG/ML solution 100 mL   Tdap (ADACEL ) injection 0.5 mL    -I have reviewed the patients home medicines and have made adjustments as needed    Cardiac Monitoring: The patient was maintained on a cardiac monitor.  I personally viewed and interpreted the cardiac monitored which showed an underlying rhythm of: Normal sinus rhythm  Social Determinants of Health:  Factors impacting patients care include: Advanced age   Reevaluation: After the interventions noted above, I reevaluated the patient and found that they have :improved  Co morbidities that complicate the patient evaluation  Past Medical History:  Diagnosis Date   Anemia    Basal cell cancer    Dr Rolan Molt   Benign prostatic hypertrophy    Dr Janit   Carpal tunnel  syndrome 10/19/2015   Right   Chronic kidney disease    Chronic kidney disease    Colon polyp    Diverticulosis    Hyperlipidemia    Kidney stones    Shingles    Stroke [facility])    Transient ischemic attack    Dr Rosemarie      Dispostion: I considered admission for this patient, however given his reassuring imaging he is appropriate for outpatient management.     Final Clinical Impression(s) / ED Diagnoses Final diagnoses:  Fall, initial encounter  Closed nondisplaced fracture of sternal end of left clavicle, initial encounter  Skin tear of left elbow without complication, initial encounter     @PCDICTATION @     [1]  Social History Tobacco Use   Smoking status: Former    Current packs/day: 0.00    Types: Cigarettes, Pipe, Cigars    Quit date: 07/23/1976    Years since quitting: 48.0   Smokeless tobacco: Former    Types: Chew   Tobacco comments:    smoked [phone], up to < 1 ppd  Vaping Use   Vaping status: Never Used  Substance Use Topics   Alcohol use: No    Alcohol/week: 1.0 standard drink of alcohol    Types: 1 Glasses of wine per week    Comment: rare wine   Drug use: No     Mannie Pac T, DO 08/08/24 1051  "

## 2024-08-08 NOTE — Discharge Instructions (Signed)
 You have a fracture of your collarbone on your left side.  You may take 1000 mg of Tylenol  every 8 hours for this.  I recommend using a sling.  You may take the sling off to shower or bathe.  If you do not have pain when not wearing the sling, you do not need to wear it.  The collarbone will not require surgery, it will heal fine on its own.  We have included the telephone number for a orthopedic doctor in your discharge paperwork.  Please call them next week for follow-up appointment.  Return to the emergency room for worsening pain, repeat falls.

## 2024-08-08 NOTE — ED Triage Notes (Signed)
 States slipped on a rug in BR about 0300 this am. No LOC. Pain to left elbow, right side. Abrasion noted to scalp. Also states he was on the BR floor for about 2 hrs before he had help to get up

## 2024-08-11 ENCOUNTER — Encounter: Payer: Self-pay | Admitting: Internal Medicine

## 2024-08-11 ENCOUNTER — Ambulatory Visit: Admitting: Internal Medicine

## 2024-08-11 VITALS — BP 142/86 | HR 73 | Ht 70.0 in | Wt 191.6 lb

## 2024-08-11 DIAGNOSIS — F03911 Unspecified dementia, unspecified severity, with agitation: Secondary | ICD-10-CM | POA: Diagnosis not present

## 2024-08-11 DIAGNOSIS — K5900 Constipation, unspecified: Secondary | ICD-10-CM | POA: Insufficient documentation

## 2024-08-11 DIAGNOSIS — W19XXXS Unspecified fall, sequela: Secondary | ICD-10-CM | POA: Insufficient documentation

## 2024-08-11 DIAGNOSIS — Y92009 Unspecified place in unspecified non-institutional (private) residence as the place of occurrence of the external cause: Secondary | ICD-10-CM | POA: Diagnosis not present

## 2024-08-11 DIAGNOSIS — E538 Deficiency of other specified B group vitamins: Secondary | ICD-10-CM | POA: Diagnosis not present

## 2024-08-11 DIAGNOSIS — K5904 Chronic idiopathic constipation: Secondary | ICD-10-CM

## 2024-08-11 NOTE — Assessment & Plan Note (Signed)
 We can do monthly B12 shots q 30 d Another option would be to do kids vitamins that the patient would agree to take daily The importance of compliance was discussed.  I will prescribe a high-dose vitamin D  -she can give him 1 capsule every 2 weeks.  His vitamin B-12 is low -try to give him kids chewable multivitamin, i.e. Gummies daily.  If he is refusing-do B12 shots monthly.

## 2024-08-11 NOTE — Assessment & Plan Note (Addendum)
 Dissolve 4 scoops of Miralax in a drink - drink at once or over 1-2 hrs Take Dulcolax 2 tablets twice a day D/c Norco

## 2024-08-11 NOTE — Patient Instructions (Signed)
 Dissolve 4 scoops of Miralax in a drink - drink at once or over 1-2 hrs Take Dulcolax 2 tablets twice a day

## 2024-08-11 NOTE — Progress Notes (Signed)
 "  Subjective:  Patient ID: Justin [name], male    DOB: [birth year]  Age: 89 y.o. MRN: [id number]  CC: Medical Management of Chronic Issues (Follow up. Recent fall Sunday, fractured collarbone (left). Right upper abdominal pain. Elbow laceration (left))   HPI Justin [name] presents for dementia Pt fell on Sat this past wknd: He slipped on a rug in the bathroom at about 3 AM. Struck his right side of his chest on the bathtub, has pain in his left wrist, back of the head. Did strike his head, no LOC, not on blood thinners. Has been ambulatory since.  Head CT was OK  Taking Norco for pain - c/o constipation x 4-5 d  Here w/his wife   Outpatient Medications Prior to Visit  Medication Sig Dispense Refill   ALPRAZolam  (XANAX ) 0.25 MG tablet Take 1 tablet (0.25 mg total) by mouth 2 (two) times daily as needed for anxiety or sleep. 60 tablet 3   aspirin 81 MG tablet Take 81 mg by mouth daily.     finasteride  (PROSCAR ) 5 MG tablet Take 1 tablet (5 mg total) by mouth daily. 90 tablet 1   fluticasone  (FLONASE ) 50 MCG/ACT nasal spray Place 2 sprays into both nostrils daily. 16 g 6   QUEtiapine  (SEROQUEL ) 25 MG tablet Take 2 tablets (50 mg total) by mouth at bedtime. 180 tablet 2   sildenafil (VIAGRA) 100 MG tablet Take 100 mg by mouth.     simvastatin  (ZOCOR ) 20 MG tablet Take 1 tablet (20 mg total) by mouth daily. Pt needs office visit for further refills 90 tablet 1   Vitamin D , Ergocalciferol , (DRISDOL ) 1.25 MG (50000 UNIT) CAPS capsule Take 1 capsule (50,000 Units total) by mouth every 14 (fourteen) days. 6 capsule 5   levocetirizine (XYZAL ) 5 MG tablet Take 1 tablet (5 mg total) by mouth every evening. (Patient not taking: Reported on 08/11/2024) 30 tablet 5   Syringe/Needle, Disp, (SYRINGE 3CC/25GX5/8) 25G X 5/8 3 ML MISC Use with b12 every 2 weeks (Patient not taking: Reported on 08/11/2024) 10 each 1   No facility-administered medications prior to visit.    ROS: Review of Systems   Constitutional:  Positive for fatigue. Negative for appetite change and unexpected weight change.  HENT:  Negative for congestion, nosebleeds, sneezing, sore throat and trouble swallowing.   Eyes:  Negative for itching and visual disturbance.  Respiratory:  Negative for cough.   Cardiovascular:  Negative for chest pain, palpitations and leg swelling.  Gastrointestinal:  Negative for abdominal distention, blood in stool, diarrhea and nausea.  Genitourinary:  Negative for frequency and hematuria.  Musculoskeletal:  Positive for arthralgias, back pain and gait problem. Negative for joint swelling and neck pain.  Skin:  Negative for rash.  Neurological:  Positive for weakness. Negative for dizziness, tremors and speech difficulty.  Hematological:  Bruises/bleeds easily.  Psychiatric/Behavioral:  Positive for agitation, behavioral problems, confusion and decreased concentration. Negative for dysphoric mood, self-injury, sleep disturbance and suicidal ideas. The patient is not nervous/anxious.     Objective:  BP (!) 142/86   Pulse 73   Ht 5' 10 (1.778 m)   Wt 191 lb 9.6 oz (86.9 kg)   SpO2 99%   BMI 27.49 kg/m   BP Readings from Last 3 Encounters:  08/11/24 (!) 142/86  08/08/24 (!) 144/77  02/27/24 120/70    Wt Readings from Last 3 Encounters:  08/11/24 191 lb 9.6 oz (86.9 kg)  05/14/24 186 lb (84.4 kg)  02/27/24 186 lb (84.4  kg)    Physical Exam Constitutional:      General: He is not in acute distress.    Appearance: He is well-developed.     Comments: NAD  Eyes:     Conjunctiva/sclera: Conjunctivae normal.     Pupils: Pupils are equal, round, and reactive to light.  Neck:     Thyroid : No thyromegaly.     Vascular: No JVD.  Cardiovascular:     Rate and Rhythm: Normal rate and regular rhythm.     Heart sounds: Normal heart sounds. No murmur heard.    No friction rub. No gallop.  Pulmonary:     Effort: Pulmonary effort is normal. No respiratory distress.     Breath  sounds: Normal breath sounds. No wheezing or rales.  Chest:     Chest wall: No tenderness.  Abdominal:     General: Bowel sounds are normal. There is no distension.     Palpations: Abdomen is soft. There is no mass.     Tenderness: There is no abdominal tenderness. There is no guarding or rebound.  Musculoskeletal:        General: No tenderness. Normal range of motion.     Cervical back: Normal range of motion.  Lymphadenopathy:     Cervical: No cervical adenopathy.  Skin:    General: Skin is warm and dry.     Findings: No rash.  Neurological:     Mental Status: He is alert. Mental status is at baseline.     Cranial Nerves: No cranial nerve deficit.     Motor: No abnormal muscle tone.     Coordination: Coordination normal.     Gait: Gait normal.     Deep Tendon Reflexes: Reflexes are normal and symmetric.  Psychiatric:        Behavior: Behavior normal.        Thought Content: Thought content normal.   Confused, alert, cooperative Bruises on hands  Lab Results  Component Value Date   WBC 8.0 08/08/2024   HGB 10.7 (L) 08/08/2024   HCT 30.4 (L) 08/08/2024   PLT 198 08/08/2024   GLUCOSE 125 (H) 08/08/2024   CHOL 118 02/27/2024   TRIG 107.0 02/27/2024   HDL 42.80 02/27/2024   LDLDIRECT 49.0 12/05/2020   LDLCALC 54 02/27/2024   ALT 11 02/27/2024   AST 14 02/27/2024   NA 138 08/08/2024   K 3.7 08/08/2024   CL 104 08/08/2024   CREATININE 0.79 08/08/2024   BUN 16 08/08/2024   CO2 27 08/08/2024   TSH 4.66 02/27/2024   PSA 3.50 02/27/2024   HGBA1C 5.4 01/10/2016    DG Elbow Complete Left Result Date: 08/08/2024 EXAM: 3 VIEW(S) XRAY OF THE LEFT ELBOW COMPARISON: None available. CLINICAL HISTORY: Trauma Trauma FINDINGS: BONES AND JOINTS: No acute fracture. No malalignment. SOFT TISSUES: Unremarkable. IMPRESSION: 1. No significant abnormality. Electronically signed by: Evalene Coho MD 08/08/2024 10:37 AM EST RP Workstation: [id number]   DG Wrist Complete Left Result  Date: 08/08/2024 EXAM: 3 or more view(s) Xray of the left wrist 08/08/2024 09:59:40 AM COMPARISON: None available. CLINICAL HISTORY: Trauma trauma trauma trauma FINDINGS: BONES AND JOINTS: No acute fracture. No malalignment. SOFT TISSUES: Unremarkable. IMPRESSION: 1. No significant abnormality. Electronically signed by: Evalene Coho MD 08/08/2024 10:36 AM EST RP Workstation: [id number]   CT CHEST ABDOMEN PELVIS W CONTRAST Result Date: 08/08/2024 EXAM: CT CHEST, ABDOMEN AND PELVIS WITH CONTRAST 08/08/2024 10:00:00 AM TECHNIQUE: CT of the chest, abdomen and pelvis was performed with the administration of  100 mL iohexol  (OMNIPAQUE ) 300 MG/ML solution. Multiplanar reformatted images are provided for review. Automated exposure control, iterative reconstruction, and/or weight based adjustment of the mA/kV was utilized to reduce the radiation dose to as low as reasonably achievable. COMPARISON: CT of the abdomen and pelvis dated 03/15/2017. CLINICAL HISTORY: Polytrauma, blunt. FINDINGS: CHEST: MEDIASTINUM AND LYMPH NODES: Heart is mildly enlarged. The central airways are clear. The thoracic esophagus is distended with air and ingested debris. There is a mild hiatus hernia. No mediastinal, hilar or axillary lymphadenopathy. LUNGS AND PLEURA: Ground glass opacities. There is a very irregular reticulation of the lung periphery. No focal consolidation or pulmonary edema. No pleural effusion. No pneumothorax. ABDOMEN AND PELVIS: LIVER: Unremarkable. GALLBLADDER AND BILE DUCTS: Unremarkable. No biliary ductal dilatation. SPLEEN: No acute abnormality. PANCREAS: No acute abnormality. ADRENAL GLANDS: No acute abnormality. KIDNEYS, URETERS AND BLADDER: There is a right pelvic kidney again demonstrated, which contains an ovoid calculus measuring approximately 11 mm in length. There are a couple of 2 to 3 mm nonobstructive calculi also present within the upper pole of the left kidney, in addition to a small cyst. There is no  evidence of obstructive uropathy. No perinephric or periureteral stranding. Urinary bladder is unremarkable. GI AND BOWEL: Stomach demonstrates no acute abnormality. There are numerous colonic diverticula, but there is no evidence of diverticulitis. The remainder of the bowel including the appendix is unremarkable. There is no bowel obstruction. REPRODUCTIVE ORGANS: The prostate gland is mildly prominent. No acute abnormality. PERITONEUM AND RETROPERITONEUM: No ascites. No free air. VASCULATURE: Aorta is normal in caliber. ABDOMINAL AND PELVIS LYMPH NODES: No lymphadenopathy. BONES AND SOFT TISSUES: There is a nondisplaced acute fracture of the medial aspect of the left clavicle, which is seen on image 13 of series 4 and image 108 of coronal series 7. The bones are otherwise intact. There is mild-to-moderate scoliosis of the thoracolumbar spine. There is extensive degenerative disc disease also present throughout the thoracolumbar spine. There is a left inguinal fat-containing hernia. No focal soft tissue abnormality. IMPRESSION: 1. Nondisplaced acute fracture of the medial aspect of the left clavicle. 2. Peripheral reticulation and scattered ground-glass opacities in the lungs. 3. Right pelvic kidney with an ovoid calculus measuring approximately 11 mm in length; additional nonobstructive left renal calculi. No evidence of obstructive uropathy. 4. Mild hiatal hernia with distended thoracic esophagus containing air and ingested debris. 5. Numerous colonic diverticula without evidence of diverticulitis. 6. Left inguinal fat-containing hernia. Electronically signed by: Evalene Coho MD 08/08/2024 10:36 AM EST RP Workstation: [id number]   CT Cervical Spine Wo Contrast Result Date: 08/08/2024 EXAM: CT CERVICAL SPINE WITHOUT CONTRAST 08/08/2024 10:00:00 AM TECHNIQUE: CT of the cervical spine was performed without the administration of intravenous contrast. Multiplanar reformatted images are provided for review.  Automated exposure control, iterative reconstruction, and/or weight based adjustment of the mA/kV was utilized to reduce the radiation dose to as low as reasonably achievable. COMPARISON: None available. CLINICAL HISTORY: Ataxia, cervical trauma. FINDINGS: BONES AND ALIGNMENT: There is no evidence of fracture or acute traumatic injury. There is slight degenerative anterolisthesis present at C3-C4 and C4-C5. There is also slight degenerative retrolisthesis at C5-C6. DEGENERATIVE CHANGES: There is moderate right-sided facet arthrosis at C2-C3 and C3-C4. SOFT TISSUES: No prevertebral soft tissue swelling. There is air and ingested debris present within the upper thoracic esophagus, which is mildly distended, suggesting esophageal dysmotility or gastroesophageal reflux. IMPRESSION: 1. No evidence of fracture or acute traumatic injury. 2. Slight degenerative anterolisthesis at C3-4 and C4-5, and  slight degenerative retrolisthesis at C5-6. 3. Moderate right-sided facet arthrosis at C2-3 and C3-4. 4. Air and ingested debris within the mildly distended upper thoracic esophagus, suggesting esophageal dysmotility or gastroesophageal reflux. Electronically signed by: Evalene Coho MD 08/08/2024 10:26 AM EST RP Workstation: [id number]   CT Head Wo Contrast Result Date: 08/08/2024 EXAM: CT HEAD WITHOUT CONTRAST 08/08/2024 10:00:00 AM TECHNIQUE: CT of the head was performed without the administration of intravenous contrast. Automated exposure control, iterative reconstruction, and/or weight based adjustment of the mA/kV was utilized to reduce the radiation dose to as low as reasonably achievable. COMPARISON: CT of the head dated 11/23/2020. CLINICAL HISTORY: Ataxia, head trauma. FINDINGS: BRAIN AND VENTRICLES: Moderate parenchymal volume loss. Periventricular and subcortical white matter hypoattenuation, consistent with moderate chronic ischemic microvascular disease. Calcified atherosclerotic plaque within  cavernous/supraclinoid internal carotid arteries. No acute hemorrhage. No evidence of acute infarct. No hydrocephalus. No extra-axial collection. No mass effect or midline shift. ORBITS: The patient is status post bilateral lens replacement. No acute abnormality. SINUSES: There is mild mucosal disease within the maxillary sinuses bilaterally. SOFT TISSUES AND SKULL: There is debris/cerumen present within the external auditory canals bilaterally. No acute soft tissue abnormality. No skull fracture. IMPRESSION: 1. No acute intracranial abnormality related to the head trauma. 2. Moderate parenchymal volume loss and moderate chronic ischemic microvascular disease. 3. Calcified atherosclerotic plaque within the cavernous/supraclinoid internal carotid arteries and mild bilateral maxillary sinus mucosal disease. Electronically signed by: Evalene Coho MD 08/08/2024 10:21 AM EST RP Workstation: [id number]    Assessment & Plan:   Problem List Items Addressed This Visit     B12 deficiency   We can do monthly B12 shots q 30 d Another option would be to do kids vitamins that the patient would agree to take daily The importance of compliance was discussed.  I will prescribe a high-dose vitamin D  -she can give him 1 capsule every 2 weeks.  His vitamin B-12 is low -try to give him kids chewable multivitamin, i.e. Gummies daily.  If he is refusing-do B12 shots monthly.        Agitation due to dementia [facility])   According to his wife Esteven is much better when he takes Seroquel  25 mg 2 tablets at night, however he has been refusing to take it often.  Compliance was encouraged.      Constipation   Dissolve 4 scoops of Miralax in a drink - drink at once or over 1-2 hrs Take Dulcolax 2 tablets twice a day D/c Norco      Fall at home, sequela - Primary   Pt fell on Sat this past wknd: He slipped on a rug in the bathroom at about 3 AM. Struck his right side of his chest on the bathtub, has pain in his left  wrist, back of the head. Did strike his head, no LOC, not on blood thinners. Has been ambulatory since.  Head CT was OK D/c Norco Use Tylenol  prn         No orders of the defined types were placed in this encounter.     Follow-up: Return in about 3 months (around 11/09/2024) for a follow-up visit.  Marolyn Noel, MD "

## 2024-08-11 NOTE — Assessment & Plan Note (Signed)
 Pt fell on Sat this past wknd: He slipped on a rug in the bathroom at about 3 AM. Struck his right side of his chest on the bathtub, has pain in his left wrist, back of the head. Did strike his head, no LOC, not on blood thinners. Has been ambulatory since.  Head CT was OK D/c Norco Use Tylenol  prn

## 2024-08-11 NOTE — Assessment & Plan Note (Signed)
 According to his wife Edger is much better when he takes Seroquel  25 mg 2 tablets at night, however he has been refusing to take it often.  Compliance was encouraged.
